# Patient Record
Sex: Female | Born: 1965
Health system: Southern US, Community
[De-identification: ages and names within clinical notes are randomized; demographics above are authoritative.]

## PROBLEM LIST (undated history)

## (undated) DIAGNOSIS — B019 Varicella without complication: Secondary | ICD-10-CM

## (undated) DIAGNOSIS — R140 Abdominal distension (gaseous): Secondary | ICD-10-CM

## (undated) DIAGNOSIS — C4490 Unspecified malignant neoplasm of skin, unspecified: Secondary | ICD-10-CM

## (undated) DIAGNOSIS — F419 Anxiety disorder, unspecified: Secondary | ICD-10-CM

## (undated) DIAGNOSIS — B379 Candidiasis, unspecified: Secondary | ICD-10-CM

## (undated) DIAGNOSIS — D649 Anemia, unspecified: Secondary | ICD-10-CM

## (undated) DIAGNOSIS — M5030 Other cervical disc degeneration, unspecified cervical region: Secondary | ICD-10-CM

## (undated) DIAGNOSIS — C801 Malignant (primary) neoplasm, unspecified: Secondary | ICD-10-CM

## (undated) DIAGNOSIS — M549 Dorsalgia, unspecified: Secondary | ICD-10-CM

## (undated) DIAGNOSIS — E785 Hyperlipidemia, unspecified: Secondary | ICD-10-CM

## (undated) DIAGNOSIS — A389 Scarlet fever, uncomplicated: Secondary | ICD-10-CM

## (undated) DIAGNOSIS — H409 Unspecified glaucoma: Secondary | ICD-10-CM

## (undated) DIAGNOSIS — M1990 Unspecified osteoarthritis, unspecified site: Secondary | ICD-10-CM

## (undated) DIAGNOSIS — K219 Gastro-esophageal reflux disease without esophagitis: Secondary | ICD-10-CM

## (undated) DIAGNOSIS — R143 Flatulence: Secondary | ICD-10-CM

## (undated) DIAGNOSIS — K279 Peptic ulcer, site unspecified, unspecified as acute or chronic, without hemorrhage or perforation: Secondary | ICD-10-CM

## (undated) DIAGNOSIS — K567 Ileus, unspecified: Secondary | ICD-10-CM

## (undated) DIAGNOSIS — N390 Urinary tract infection, site not specified: Secondary | ICD-10-CM

## (undated) DIAGNOSIS — E7800 Pure hypercholesterolemia, unspecified: Secondary | ICD-10-CM

## (undated) DIAGNOSIS — F32A Depression, unspecified: Secondary | ICD-10-CM

## (undated) HISTORY — PX: BREAST BIOPSY: SHX20

## (undated) HISTORY — DX: Pure hypercholesterolemia, unspecified: E78.00

## (undated) HISTORY — DX: Anemia, unspecified: D64.9

## (undated) HISTORY — DX: Gastro-esophageal reflux disease without esophagitis: K21.9

## (undated) HISTORY — DX: Other cervical disc degeneration, unspecified cervical region: M50.30

## (undated) HISTORY — DX: Unspecified osteoarthritis, unspecified site: M19.90

## (undated) HISTORY — DX: Peptic ulcer, site unspecified, unspecified as acute or chronic, without hemorrhage or perforation: K27.9

## (undated) HISTORY — DX: Unspecified glaucoma: H40.9

## (undated) HISTORY — PX: BASAL CELL CARCINOMA EXCISION: SHX1214

## (undated) HISTORY — DX: Hyperlipidemia, unspecified: E78.5

## (undated) HISTORY — DX: Malignant (primary) neoplasm, unspecified: C80.1

## (undated) HISTORY — DX: Dorsalgia, unspecified: M54.9

## (undated) HISTORY — DX: Unspecified malignant neoplasm of skin, unspecified: C44.90

## (undated) HISTORY — PX: COLONOSCOPY: SHX174

## (undated) HISTORY — DX: Urinary tract infection, site not specified: N39.0

## (undated) HISTORY — DX: Scarlet fever, uncomplicated: A38.9

## (undated) HISTORY — PX: UPPER GASTROINTESTINAL ENDOSCOPY: SHX188

## (undated) HISTORY — DX: Flatulence: R14.3

## (undated) HISTORY — DX: Depression, unspecified: F32.A

## (undated) HISTORY — DX: Candidiasis, unspecified: B37.9

## (undated) HISTORY — DX: Varicella without complication: B01.9

## (undated) HISTORY — DX: Abdominal distension (gaseous): R14.0

## (undated) HISTORY — DX: Anxiety disorder, unspecified: F41.9

---

## 1990-10-21 HISTORY — PX: BREAST EXCISIONAL BIOPSY: SUR124

## 1990-10-21 HISTORY — PX: BREAST BIOPSY: SHX20

## 1990-10-21 HISTORY — PX: BREAST LUMPECTOMY: SHX2

## 1997-12-02 ENCOUNTER — Inpatient Hospital Stay (HOSPITAL_COMMUNITY): Admission: AD | Admit: 1997-12-02 | Discharge: 1997-12-03 | Payer: Self-pay | Admitting: Family Medicine

## 1998-08-23 ENCOUNTER — Other Ambulatory Visit: Admission: RE | Admit: 1998-08-23 | Discharge: 1998-08-23 | Payer: Self-pay | Admitting: Obstetrics and Gynecology

## 2000-08-26 ENCOUNTER — Encounter: Admission: RE | Admit: 2000-08-26 | Discharge: 2000-11-24 | Payer: Self-pay | Admitting: Family Medicine

## 2001-01-12 ENCOUNTER — Encounter
Admission: RE | Admit: 2001-01-12 | Discharge: 2001-01-22 | Payer: Self-pay | Admitting: Physical Medicine and Rehabilitation

## 2001-08-18 ENCOUNTER — Other Ambulatory Visit: Admission: RE | Admit: 2001-08-18 | Discharge: 2001-08-18 | Payer: Self-pay | Admitting: *Deleted

## 2002-10-01 ENCOUNTER — Other Ambulatory Visit: Admission: RE | Admit: 2002-10-01 | Discharge: 2002-10-01 | Payer: Self-pay | Admitting: *Deleted

## 2006-07-02 ENCOUNTER — Encounter: Admission: RE | Admit: 2006-07-02 | Discharge: 2006-07-02 | Payer: Self-pay | Admitting: Obstetrics and Gynecology

## 2007-07-06 ENCOUNTER — Ambulatory Visit (HOSPITAL_COMMUNITY): Admission: RE | Admit: 2007-07-06 | Discharge: 2007-07-06 | Payer: Self-pay | Admitting: Obstetrics and Gynecology

## 2008-07-06 ENCOUNTER — Ambulatory Visit (HOSPITAL_COMMUNITY): Admission: RE | Admit: 2008-07-06 | Discharge: 2008-07-06 | Payer: Self-pay | Admitting: Obstetrics and Gynecology

## 2008-08-05 ENCOUNTER — Ambulatory Visit (HOSPITAL_COMMUNITY): Admission: RE | Admit: 2008-08-05 | Discharge: 2008-08-05 | Payer: Self-pay | Admitting: Orthopaedic Surgery

## 2009-07-19 ENCOUNTER — Ambulatory Visit (HOSPITAL_COMMUNITY): Admission: RE | Admit: 2009-07-19 | Discharge: 2009-07-19 | Payer: Self-pay | Admitting: Obstetrics and Gynecology

## 2009-08-28 ENCOUNTER — Emergency Department (HOSPITAL_COMMUNITY): Admission: EM | Admit: 2009-08-28 | Discharge: 2009-08-28 | Payer: Self-pay | Admitting: Emergency Medicine

## 2010-07-20 ENCOUNTER — Ambulatory Visit (HOSPITAL_COMMUNITY): Admission: RE | Admit: 2010-07-20 | Discharge: 2010-07-20 | Payer: Self-pay | Admitting: Obstetrics and Gynecology

## 2010-11-11 ENCOUNTER — Encounter: Payer: Self-pay | Admitting: Obstetrics and Gynecology

## 2010-11-12 ENCOUNTER — Encounter: Payer: Self-pay | Admitting: Orthopaedic Surgery

## 2011-03-18 ENCOUNTER — Inpatient Hospital Stay (INDEPENDENT_AMBULATORY_CARE_PROVIDER_SITE_OTHER)
Admission: RE | Admit: 2011-03-18 | Discharge: 2011-03-18 | Disposition: A | Discharge status: Home / Self Care | Payer: 59 | Source: Ambulatory Visit | Attending: Family Medicine | Admitting: Family Medicine

## 2011-03-18 DIAGNOSIS — L989 Disorder of the skin and subcutaneous tissue, unspecified: Secondary | ICD-10-CM

## 2011-06-05 ENCOUNTER — Other Ambulatory Visit (HOSPITAL_COMMUNITY): Payer: Self-pay | Admitting: Family Medicine

## 2011-06-05 ENCOUNTER — Ambulatory Visit (HOSPITAL_COMMUNITY)
Admission: RE | Admit: 2011-06-05 | Discharge: 2011-06-05 | Disposition: A | Discharge status: Home / Self Care | Payer: 59 | Source: Ambulatory Visit | Attending: Family Medicine | Admitting: Family Medicine

## 2011-06-05 DIAGNOSIS — H538 Other visual disturbances: Secondary | ICD-10-CM | POA: Insufficient documentation

## 2011-06-05 DIAGNOSIS — R51 Headache: Secondary | ICD-10-CM | POA: Insufficient documentation

## 2011-07-02 ENCOUNTER — Other Ambulatory Visit (HOSPITAL_COMMUNITY): Payer: Self-pay | Admitting: Obstetrics and Gynecology

## 2011-07-02 DIAGNOSIS — Z1231 Encounter for screening mammogram for malignant neoplasm of breast: Secondary | ICD-10-CM

## 2011-07-25 ENCOUNTER — Ambulatory Visit (HOSPITAL_COMMUNITY)
Admission: RE | Admit: 2011-07-25 | Discharge: 2011-07-25 | Disposition: A | Discharge status: Home / Self Care | Payer: 59 | Source: Ambulatory Visit | Attending: Obstetrics and Gynecology | Admitting: Obstetrics and Gynecology

## 2011-07-25 DIAGNOSIS — Z1231 Encounter for screening mammogram for malignant neoplasm of breast: Secondary | ICD-10-CM | POA: Insufficient documentation

## 2012-06-30 ENCOUNTER — Other Ambulatory Visit (HOSPITAL_COMMUNITY): Payer: Self-pay | Admitting: Obstetrics and Gynecology

## 2012-06-30 DIAGNOSIS — Z1231 Encounter for screening mammogram for malignant neoplasm of breast: Secondary | ICD-10-CM

## 2012-07-13 ENCOUNTER — Ambulatory Visit (HOSPITAL_COMMUNITY): Payer: 59

## 2012-07-30 ENCOUNTER — Other Ambulatory Visit (HOSPITAL_COMMUNITY): Payer: Self-pay | Admitting: Family Medicine

## 2012-07-30 DIAGNOSIS — M549 Dorsalgia, unspecified: Secondary | ICD-10-CM

## 2012-08-03 ENCOUNTER — Ambulatory Visit (HOSPITAL_COMMUNITY)
Admission: RE | Admit: 2012-08-03 | Discharge: 2012-08-03 | Disposition: A | Discharge status: Home / Self Care | Payer: 59 | Source: Ambulatory Visit | Attending: Obstetrics and Gynecology | Admitting: Obstetrics and Gynecology

## 2012-08-03 DIAGNOSIS — Z1231 Encounter for screening mammogram for malignant neoplasm of breast: Secondary | ICD-10-CM | POA: Insufficient documentation

## 2012-08-05 ENCOUNTER — Ambulatory Visit (HOSPITAL_COMMUNITY)
Admission: RE | Admit: 2012-08-05 | Discharge: 2012-08-05 | Disposition: A | Discharge status: Home / Self Care | Payer: 59 | Source: Ambulatory Visit | Attending: Family Medicine | Admitting: Family Medicine

## 2012-08-05 DIAGNOSIS — M545 Low back pain, unspecified: Secondary | ICD-10-CM | POA: Insufficient documentation

## 2012-08-05 DIAGNOSIS — M549 Dorsalgia, unspecified: Secondary | ICD-10-CM

## 2012-08-05 DIAGNOSIS — M48061 Spinal stenosis, lumbar region without neurogenic claudication: Secondary | ICD-10-CM | POA: Insufficient documentation

## 2012-08-25 ENCOUNTER — Ambulatory Visit: Payer: 59 | Attending: Family Medicine | Admitting: Physical Therapy

## 2012-08-25 DIAGNOSIS — IMO0001 Reserved for inherently not codable concepts without codable children: Secondary | ICD-10-CM | POA: Insufficient documentation

## 2012-08-25 DIAGNOSIS — M545 Low back pain, unspecified: Secondary | ICD-10-CM | POA: Insufficient documentation

## 2012-08-25 DIAGNOSIS — M2569 Stiffness of other specified joint, not elsewhere classified: Secondary | ICD-10-CM | POA: Insufficient documentation

## 2012-08-27 ENCOUNTER — Ambulatory Visit: Payer: 59 | Admitting: Physical Therapy

## 2012-09-01 ENCOUNTER — Ambulatory Visit: Payer: 59 | Admitting: Physical Therapy

## 2012-09-03 ENCOUNTER — Ambulatory Visit: Payer: 59 | Admitting: Physical Therapy

## 2012-09-09 ENCOUNTER — Ambulatory Visit: Payer: 59 | Admitting: Physical Therapy

## 2012-09-14 ENCOUNTER — Ambulatory Visit: Payer: 59 | Admitting: Physical Therapy

## 2012-11-30 ENCOUNTER — Other Ambulatory Visit (HOSPITAL_COMMUNITY): Payer: Self-pay | Admitting: Obstetrics and Gynecology

## 2012-11-30 DIAGNOSIS — N930 Postcoital and contact bleeding: Secondary | ICD-10-CM

## 2012-12-02 ENCOUNTER — Ambulatory Visit (HOSPITAL_COMMUNITY)
Admission: RE | Admit: 2012-12-02 | Discharge: 2012-12-02 | Disposition: A | Discharge status: Home / Self Care | Payer: 59 | Source: Ambulatory Visit | Attending: Obstetrics and Gynecology | Admitting: Obstetrics and Gynecology

## 2012-12-02 DIAGNOSIS — N854 Malposition of uterus: Secondary | ICD-10-CM | POA: Insufficient documentation

## 2012-12-02 DIAGNOSIS — N930 Postcoital and contact bleeding: Secondary | ICD-10-CM | POA: Insufficient documentation

## 2013-07-08 ENCOUNTER — Other Ambulatory Visit (HOSPITAL_COMMUNITY): Payer: Self-pay | Admitting: Obstetrics and Gynecology

## 2013-07-08 DIAGNOSIS — Z1231 Encounter for screening mammogram for malignant neoplasm of breast: Secondary | ICD-10-CM

## 2013-08-04 ENCOUNTER — Ambulatory Visit (HOSPITAL_COMMUNITY)
Admission: RE | Admit: 2013-08-04 | Discharge: 2013-08-04 | Disposition: A | Discharge status: Home / Self Care | Payer: 59 | Source: Ambulatory Visit | Attending: Obstetrics and Gynecology | Admitting: Obstetrics and Gynecology

## 2013-08-04 DIAGNOSIS — Z1231 Encounter for screening mammogram for malignant neoplasm of breast: Secondary | ICD-10-CM | POA: Insufficient documentation

## 2014-07-08 ENCOUNTER — Other Ambulatory Visit (HOSPITAL_COMMUNITY): Payer: Self-pay | Admitting: Obstetrics and Gynecology

## 2014-07-08 DIAGNOSIS — Z1231 Encounter for screening mammogram for malignant neoplasm of breast: Secondary | ICD-10-CM

## 2014-08-10 ENCOUNTER — Ambulatory Visit (HOSPITAL_COMMUNITY)
Admission: RE | Admit: 2014-08-10 | Discharge: 2014-08-10 | Disposition: A | Discharge status: Home / Self Care | Payer: 59 | Source: Ambulatory Visit | Attending: Obstetrics and Gynecology | Admitting: Obstetrics and Gynecology

## 2014-08-10 DIAGNOSIS — Z1231 Encounter for screening mammogram for malignant neoplasm of breast: Secondary | ICD-10-CM | POA: Diagnosis not present

## 2014-11-08 ENCOUNTER — Other Ambulatory Visit: Payer: Self-pay | Admitting: Gastroenterology

## 2014-11-08 DIAGNOSIS — R1310 Dysphagia, unspecified: Secondary | ICD-10-CM

## 2014-11-09 ENCOUNTER — Ambulatory Visit
Admission: RE | Admit: 2014-11-09 | Discharge: 2014-11-09 | Disposition: A | Discharge status: Home / Self Care | Payer: 59 | Source: Ambulatory Visit | Attending: Gastroenterology | Admitting: Gastroenterology

## 2014-11-09 DIAGNOSIS — R1310 Dysphagia, unspecified: Secondary | ICD-10-CM

## 2015-07-19 ENCOUNTER — Other Ambulatory Visit: Payer: Self-pay

## 2015-07-19 DIAGNOSIS — Z1231 Encounter for screening mammogram for malignant neoplasm of breast: Secondary | ICD-10-CM

## 2015-08-22 ENCOUNTER — Ambulatory Visit
Admission: RE | Admit: 2015-08-22 | Discharge: 2015-08-22 | Disposition: A | Discharge status: Home / Self Care | Payer: 59 | Source: Ambulatory Visit

## 2015-08-22 DIAGNOSIS — Z1231 Encounter for screening mammogram for malignant neoplasm of breast: Secondary | ICD-10-CM

## 2015-11-01 MED FILL — FLUoxetine HCL 20 MG CAPS: 20 | 90 days supply | Qty: 90 | Fill #0

## 2015-11-10 ENCOUNTER — Other Ambulatory Visit (HOSPITAL_COMMUNITY): Payer: Self-pay | Admitting: Family Medicine

## 2015-11-10 DIAGNOSIS — R0789 Other chest pain: Secondary | ICD-10-CM

## 2015-11-10 DIAGNOSIS — K219 Gastro-esophageal reflux disease without esophagitis: Secondary | ICD-10-CM | POA: Diagnosis not present

## 2015-11-10 MED FILL — SUCRALFATE 1 GM TABLET: 1 | 60 days supply | Qty: 60 | Fill #0

## 2015-11-13 ENCOUNTER — Ambulatory Visit (HOSPITAL_COMMUNITY)
Admission: RE | Admit: 2015-11-13 | Discharge: 2015-11-13 | Disposition: A | Discharge status: Home / Self Care | Payer: 59 | Source: Ambulatory Visit | Attending: Family Medicine | Admitting: Family Medicine

## 2015-11-13 ENCOUNTER — Encounter (HOSPITAL_COMMUNITY): Payer: Self-pay

## 2015-11-13 DIAGNOSIS — R0789 Other chest pain: Secondary | ICD-10-CM | POA: Diagnosis not present

## 2015-11-13 DIAGNOSIS — R079 Chest pain, unspecified: Secondary | ICD-10-CM | POA: Diagnosis not present

## 2015-11-13 MED ORDER — IOHEXOL 300 MG/ML  SOLN
80.0000 mL | Freq: Once | INTRAMUSCULAR | Status: AC | PRN
  Administered 2015-11-13: 75 mL via INTRAVENOUS

## 2015-12-05 ENCOUNTER — Ambulatory Visit (INDEPENDENT_AMBULATORY_CARE_PROVIDER_SITE_OTHER): Payer: 59 | Admitting: Internal Medicine

## 2015-12-05 ENCOUNTER — Encounter: Payer: Self-pay | Admitting: Internal Medicine

## 2015-12-05 VITALS — BP 112/64 | HR 68 | Ht 62.5 in | Wt 142.0 lb

## 2015-12-05 DIAGNOSIS — R0789 Other chest pain: Secondary | ICD-10-CM | POA: Diagnosis not present

## 2015-12-05 LAB — NITRIC OXIDE: NITRIC OXIDE: 8

## 2015-12-05 NOTE — Progress Notes (Signed)
   Subjective:    Patient ID: Michele [name], female    DOB: 1965-11-17, 50 y.o.   MRN: [id number]  HPI    Review of Systems  Constitutional: Negative for fever and unexpected weight change.  HENT: Negative for congestion, dental problem, ear pain, nosebleeds, postnasal drip, rhinorrhea, sinus pressure, sneezing, sore throat and trouble swallowing.   Eyes: Negative for redness and itching.  Respiratory: Positive for cough and chest tightness. Negative for shortness of breath and wheezing.   Cardiovascular: Negative for palpitations and leg swelling.  Gastrointestinal: Negative for nausea and vomiting.  Genitourinary: Negative for dysuria.  Musculoskeletal: Negative for joint swelling.  Skin: Positive for rash.  Neurological: Positive for headaches.  Hematological: Does not bruise/bleed easily.  Psychiatric/Behavioral: Negative for dysphoric mood. The patient is not nervous/anxious.        Objective:   Physical Exam        Assessment & Plan:

## 2015-12-05 NOTE — Patient Instructions (Signed)
ICD-9-CM ICD-10-CM   1. Chest tightness or pressure 786.59 R07.89    Unclear what is causing his symptoms I am not sure if you're lung atelectasis is related to this or not There is no evidence of eosinophilic airway inflammation  Plan - I have asked for a second opinion on CT chest from thoracic radiologist; we will keep you posted - Full pulmonary function tests at the earliest available opportunity any location  - Call us on [phone] after the test or email me at my [facility] email - I will look at the test and then advised to the next step which might involve methacholine challenge test or a high-resolution CT chest  Follow-up   - Please stay in touch via phone or email

## 2015-12-05 NOTE — Addendum Note (Signed)
Addended by: Collier Salina on: 12/05/2015 05:09 PM   Modules accepted: Orders

## 2015-12-05 NOTE — Progress Notes (Signed)
Subjective:    Patient ID: Michele [name], female    DOB: [birth year], 50 y.o.   MRN: [id number]  PCP Shirline Frees, MD   HPI IOV 12/05/2015  Chief Complaint  Patient presents with  . pulmonary consult    self ref. pt. c/o chest pain/tightness X33mo. prod. cough yellow in colorX1wk. denies wheezing.   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  Reports an unusual feeling of chest tightness in the front and back since September 2016. Back in September 2016 it was of insidious onset and she only had one episode but by end of October 2016 it was happening more frequently. At this point in time she saw primary care physician Dr. Kenton Kingfisher who between October 2016 in January 2017 tried different acid reflux regimens and even got an EKG but these did not help. In fact she got headaches with acid reflux medication. In the last one month the symptoms are present on a daily basis he did it is not constant. It is more episodic. This no clear cut aggravating or relieving factors. She describes the symptoms as summary took the suture between the second and third intercostal space and bilaterally tight in her chest. She feels the same in the opposite side of her chest. For the last 10 days or so she has a mild cough with mild yellow sputum occasidonally. Symptoms can be made worse by eating or drinking or sitting in the chair awaking up but not by exertion. She goes for daily walks. Symptoms are associated with a sense that she has to take a deep breath to get all the air in. This no definite wheezing.  She had a plain CT chest 11/13/2015 that is reported as bilateral by basal subpleural atelectasis with right greater than the left. Overall the CT read as being considered normal. I personally visualized this film and I can see some subtle areas of right lower lobe scarring presumably from previous walking pneumonia versus atelectasis.  Exhaled nitric oxide today in our  office - 8ppb and normal  Walk test 185 feet x 3 laps on RA - > did not desat  Seeing DR Oletta Lamas GI 12/06/15   Past  - c6 "bulging" disc - Rx prozac - myofascial spams and neuropathy 2009   has a past medical history of Hyperlipidemia.   reports that she has never smoked. She does not have any smokeless tobacco history on file.  History reviewed. No pertinent past surgical history.  Allergies  Allergen Reactions  . Celebrex [Celecoxib] Rash    Immunization History  Administered Date(s) Administered  . Influenza Split 08/07/2015    Family History  Problem Relation Age of Onset  . Hypertension Mother   . Diabetes Mother   . Diabetes Father   . Stroke Maternal Uncle      Current outpatient prescriptions:  .  b complex vitamins tablet, Take 1 tablet by mouth daily., Disp: , Rfl:  .  Specialty Vitamins Products (MAGNESIUM, AMINO ACID CHELATE,) 133 MG tablet, Take 1 tablet by mouth daily., Disp: , Rfl:  .  FLUoxetine (PROZAC) 20 MG capsule, , Disp: , Rfl: 1       Review of Systems     Objective:   Physical Exam  Constitutional: She is oriented to person, place, and time. She appears well-developed and well-nourished. No distress.  HENT:  Head: Normocephalic and atraumatic.  Right Ear: External ear normal.  Left Ear: External ear normal.  Mouth/Throat:  Oropharynx is clear and moist. No oropharyngeal exudate.  Eyes: Conjunctivae and EOM are normal. Pupils are equal, round, and reactive to light. Right eye exhibits no discharge. Left eye exhibits no discharge. No scleral icterus.  Neck: Normal range of motion. Neck supple. No JVD present. No tracheal deviation present. No thyromegaly present.  Cardiovascular: Normal rate, regular rhythm, normal heart sounds and intact distal pulses.  Exam reveals no gallop and no friction rub.   No murmur heard. Pulmonary/Chest: Effort normal and breath sounds normal. No respiratory distress. She has no wheezes. She has no rales.  She exhibits no tenderness.  Abdominal: Soft. Bowel sounds are normal. She exhibits no distension and no mass. There is no tenderness. There is no rebound and no guarding.  Musculoskeletal: Normal range of motion. She exhibits no edema or tenderness.  Lymphadenopathy:    She has no cervical adenopathy.  Neurological: She is alert and oriented to person, place, and time. She has normal reflexes. No cranial nerve deficit. She exhibits normal muscle tone. Coordination normal.  Skin: Skin is warm and dry. No rash noted. She is not diaphoretic. No erythema. No pallor.  Psychiatric: She has a normal mood and affect. Her behavior is normal. Judgment and thought content normal.  Vitals reviewed.   Filed Vitals:   12/05/15 1459  BP: 112/64  Pulse: 68  Height: 5' 2.5" (1.588 m)  Weight: 142 lb (64.411 kg)  SpO2: 100%          Assessment & Plan:     ICD-9-CM ICD-10-CM   1. Chest tightness or pressure 786.59 R07.89     Unclear what is causing his symptoms I am not sure if you're lung atelectasis is related to this or not There is no evidence of eosinophilic airway inflammation  Plan - I have asked for a second opinion on CT chest from thoracic radiologist; we will keep you posted - Full pulmonary function tests at the earliest available opportunity any location  - Call us on [phone] after the test or email me at my [facility] email - I will look at the test and then advised to the next step which might involve methacholine challenge test or a high-resolution CT chest  Follow-up   - Please stay in touch via phone or email - will be interested in seeing what DR Oletta Lamas GI says   Dr. Brand Males, M.D., [facility].C.P Pulmonary and Critical Care Medicine Staff Physician [facility] Pager: [phone], If no answer or between  15:00h - 7:00h: call [phone]  12/05/2015 3:56 PM

## 2015-12-06 DIAGNOSIS — R0789 Other chest pain: Secondary | ICD-10-CM | POA: Diagnosis not present

## 2015-12-11 ENCOUNTER — Encounter (HOSPITAL_COMMUNITY): Payer: 59

## 2015-12-13 ENCOUNTER — Other Ambulatory Visit: Payer: Self-pay | Admitting: Gastroenterology

## 2015-12-20 ENCOUNTER — Encounter (HOSPITAL_COMMUNITY): Admission: RE | Payer: Self-pay | Source: Ambulatory Visit

## 2015-12-20 ENCOUNTER — Ambulatory Visit (HOSPITAL_COMMUNITY): Admission: RE | Admit: 2015-12-20 | Payer: 59 | Source: Ambulatory Visit | Admitting: Gastroenterology

## 2015-12-20 SURGERY — EGD (ESOPHAGOGASTRODUODENOSCOPY)
Anesthesia: Monitor Anesthesia Care

## 2016-01-23 MED FILL — FLUoxetine HCL 20 MG CAPS: 20 | 90 days supply | Qty: 90 | Fill #1

## 2016-01-27 DIAGNOSIS — R1084 Generalized abdominal pain: Secondary | ICD-10-CM | POA: Diagnosis not present

## 2016-01-27 DIAGNOSIS — R1013 Epigastric pain: Secondary | ICD-10-CM | POA: Diagnosis not present

## 2016-01-27 DIAGNOSIS — R110 Nausea: Secondary | ICD-10-CM | POA: Diagnosis not present

## 2016-01-31 DIAGNOSIS — R143 Flatulence: Secondary | ICD-10-CM | POA: Diagnosis not present

## 2016-01-31 DIAGNOSIS — M25552 Pain in left hip: Secondary | ICD-10-CM | POA: Diagnosis not present

## 2016-01-31 DIAGNOSIS — R195 Other fecal abnormalities: Secondary | ICD-10-CM | POA: Diagnosis not present

## 2016-02-09 MED FILL — PROMETHAZINE 12.5 MG TABLET: 12.5 | 10 days supply | Qty: 30 | Fill #0

## 2016-02-22 DIAGNOSIS — L237 Allergic contact dermatitis due to plants, except food: Secondary | ICD-10-CM | POA: Diagnosis not present

## 2016-02-22 MED FILL — CLOBETASOL 0.05% CREAM: 0.05 | 14 days supply | Qty: 60 | Fill #0

## 2016-03-13 DIAGNOSIS — H16212 Exposure keratoconjunctivitis, left eye: Secondary | ICD-10-CM | POA: Diagnosis not present

## 2016-03-13 MED FILL — ZYLET EYE DROPS: 0.5-0.3 | 25 days supply | Qty: 5 | Fill #0

## 2016-03-19 MED FILL — metroNIDAZOLE 500 MG TABS: 500 | 5 days supply | Qty: 10 | Fill #0

## 2016-03-28 DIAGNOSIS — Z6824 Body mass index (BMI) 24.0-24.9, adult: Secondary | ICD-10-CM | POA: Diagnosis not present

## 2016-03-28 DIAGNOSIS — Z01419 Encounter for gynecological examination (general) (routine) without abnormal findings: Secondary | ICD-10-CM | POA: Diagnosis not present

## 2016-05-01 MED FILL — FLUoxetine HCL 20 MG CAPS: 20 | 90 days supply | Qty: 90 | Fill #0

## 2016-07-02 MED FILL — FLUCONAZOLE 150 MG TABLET: 150 | 3 days supply | Qty: 2 | Fill #0

## 2016-08-01 MED FILL — FLUCONAZOLE 150 MG TABLET: 150 | 4 days supply | Qty: 2 | Fill #0

## 2016-08-01 MED FILL — FLUCONAZOLE 200 MG TABLET: 200 | 1 days supply | Qty: 1 | Fill #0

## 2016-08-08 ENCOUNTER — Other Ambulatory Visit: Payer: Self-pay | Admitting: Obstetrics and Gynecology

## 2016-08-08 DIAGNOSIS — Z1231 Encounter for screening mammogram for malignant neoplasm of breast: Secondary | ICD-10-CM

## 2016-08-08 MED FILL — FLUoxetine HCL 20 MG CAPS: 20 | 90 days supply | Qty: 90 | Fill #0

## 2016-08-13 DIAGNOSIS — D225 Melanocytic nevi of trunk: Secondary | ICD-10-CM | POA: Diagnosis not present

## 2016-08-13 DIAGNOSIS — Z85828 Personal history of other malignant neoplasm of skin: Secondary | ICD-10-CM | POA: Diagnosis not present

## 2016-08-13 DIAGNOSIS — L700 Acne vulgaris: Secondary | ICD-10-CM | POA: Diagnosis not present

## 2016-08-13 DIAGNOSIS — L814 Other melanin hyperpigmentation: Secondary | ICD-10-CM | POA: Diagnosis not present

## 2016-08-13 DIAGNOSIS — L821 Other seborrheic keratosis: Secondary | ICD-10-CM | POA: Diagnosis not present

## 2016-08-13 DIAGNOSIS — D2261 Melanocytic nevi of right upper limb, including shoulder: Secondary | ICD-10-CM | POA: Diagnosis not present

## 2016-08-13 DIAGNOSIS — L570 Actinic keratosis: Secondary | ICD-10-CM | POA: Diagnosis not present

## 2016-08-13 DIAGNOSIS — L720 Epidermal cyst: Secondary | ICD-10-CM | POA: Diagnosis not present

## 2016-08-13 DIAGNOSIS — D1801 Hemangioma of skin and subcutaneous tissue: Secondary | ICD-10-CM | POA: Diagnosis not present

## 2016-08-15 DIAGNOSIS — N760 Acute vaginitis: Secondary | ICD-10-CM | POA: Diagnosis not present

## 2016-08-28 ENCOUNTER — Ambulatory Visit
Admission: RE | Admit: 2016-08-28 | Discharge: 2016-08-28 | Disposition: A | Discharge status: Home / Self Care | Payer: 59 | Source: Ambulatory Visit | Attending: Obstetrics and Gynecology | Admitting: Obstetrics and Gynecology

## 2016-08-28 DIAGNOSIS — Z1231 Encounter for screening mammogram for malignant neoplasm of breast: Secondary | ICD-10-CM

## 2016-09-04 DIAGNOSIS — F419 Anxiety disorder, unspecified: Secondary | ICD-10-CM | POA: Diagnosis not present

## 2016-09-04 DIAGNOSIS — E7800 Pure hypercholesterolemia, unspecified: Secondary | ICD-10-CM | POA: Diagnosis not present

## 2016-09-04 DIAGNOSIS — Z833 Family history of diabetes mellitus: Secondary | ICD-10-CM | POA: Diagnosis not present

## 2016-11-18 MED FILL — FLUoxetine HCL 20 MG CAPS: 20 | 90 days supply | Qty: 90 | Fill #0

## 2016-12-06 DIAGNOSIS — R079 Chest pain, unspecified: Secondary | ICD-10-CM | POA: Diagnosis not present

## 2016-12-06 DIAGNOSIS — Z13220 Encounter for screening for lipoid disorders: Secondary | ICD-10-CM | POA: Diagnosis not present

## 2016-12-06 DIAGNOSIS — M26609 Unspecified temporomandibular joint disorder, unspecified side: Secondary | ICD-10-CM | POA: Diagnosis not present

## 2016-12-06 DIAGNOSIS — E7800 Pure hypercholesterolemia, unspecified: Secondary | ICD-10-CM | POA: Diagnosis not present

## 2016-12-06 DIAGNOSIS — M542 Cervicalgia: Secondary | ICD-10-CM | POA: Diagnosis not present

## 2016-12-13 ENCOUNTER — Other Ambulatory Visit (HOSPITAL_COMMUNITY): Payer: Self-pay | Admitting: Family Medicine

## 2016-12-13 DIAGNOSIS — R519 Headache, unspecified: Secondary | ICD-10-CM

## 2016-12-13 DIAGNOSIS — R51 Headache: Principal | ICD-10-CM

## 2016-12-13 MED FILL — ROSUVASTATIN CALCIUM 5 MG T: 5 | 30 days supply | Qty: 30 | Fill #0

## 2016-12-26 ENCOUNTER — Ambulatory Visit (HOSPITAL_COMMUNITY)
Admission: RE | Admit: 2016-12-26 | Discharge: 2016-12-26 | Disposition: A | Discharge status: Home / Self Care | Payer: 59 | Source: Ambulatory Visit | Attending: Family Medicine | Admitting: Family Medicine

## 2016-12-26 ENCOUNTER — Encounter (HOSPITAL_COMMUNITY): Payer: Self-pay

## 2016-12-26 DIAGNOSIS — R51 Headache: Secondary | ICD-10-CM | POA: Diagnosis not present

## 2016-12-26 DIAGNOSIS — R519 Headache, unspecified: Secondary | ICD-10-CM

## 2017-01-01 MED FILL — BUPROPION HCL XL 150 MG TAB: 150 | 30 days supply | Qty: 30 | Fill #0

## 2017-02-24 MED FILL — FLUoxetine HCL 20 MG CAPS: 20 | 90 days supply | Qty: 90 | Fill #1

## 2017-04-14 DIAGNOSIS — Z01419 Encounter for gynecological examination (general) (routine) without abnormal findings: Secondary | ICD-10-CM | POA: Diagnosis not present

## 2017-04-14 DIAGNOSIS — Z6827 Body mass index (BMI) 27.0-27.9, adult: Secondary | ICD-10-CM | POA: Diagnosis not present

## 2017-05-27 DIAGNOSIS — N958 Other specified menopausal and perimenopausal disorders: Secondary | ICD-10-CM | POA: Diagnosis not present

## 2017-05-27 DIAGNOSIS — N930 Postcoital and contact bleeding: Secondary | ICD-10-CM | POA: Diagnosis not present

## 2017-05-28 DIAGNOSIS — R140 Abdominal distension (gaseous): Secondary | ICD-10-CM | POA: Diagnosis not present

## 2017-05-28 DIAGNOSIS — R143 Flatulence: Secondary | ICD-10-CM | POA: Diagnosis not present

## 2017-06-05 MED FILL — FLUoxetine HCL 20 MG CAPS: 20 | 90 days supply | Qty: 90 | Fill #2

## 2017-06-13 DIAGNOSIS — F419 Anxiety disorder, unspecified: Secondary | ICD-10-CM | POA: Diagnosis not present

## 2017-07-24 ENCOUNTER — Other Ambulatory Visit: Payer: Self-pay | Admitting: Obstetrics and Gynecology

## 2017-07-24 DIAGNOSIS — Z1239 Encounter for other screening for malignant neoplasm of breast: Secondary | ICD-10-CM

## 2017-08-13 DIAGNOSIS — L237 Allergic contact dermatitis due to plants, except food: Secondary | ICD-10-CM | POA: Diagnosis not present

## 2017-08-13 DIAGNOSIS — D1801 Hemangioma of skin and subcutaneous tissue: Secondary | ICD-10-CM | POA: Diagnosis not present

## 2017-08-13 DIAGNOSIS — D225 Melanocytic nevi of trunk: Secondary | ICD-10-CM | POA: Diagnosis not present

## 2017-08-13 DIAGNOSIS — L821 Other seborrheic keratosis: Secondary | ICD-10-CM | POA: Diagnosis not present

## 2017-08-13 DIAGNOSIS — L820 Inflamed seborrheic keratosis: Secondary | ICD-10-CM | POA: Diagnosis not present

## 2017-08-13 DIAGNOSIS — Z85828 Personal history of other malignant neoplasm of skin: Secondary | ICD-10-CM | POA: Diagnosis not present

## 2017-08-13 DIAGNOSIS — Z23 Encounter for immunization: Secondary | ICD-10-CM | POA: Diagnosis not present

## 2017-08-13 DIAGNOSIS — D2261 Melanocytic nevi of right upper limb, including shoulder: Secondary | ICD-10-CM | POA: Diagnosis not present

## 2017-08-13 DIAGNOSIS — L814 Other melanin hyperpigmentation: Secondary | ICD-10-CM | POA: Diagnosis not present

## 2017-08-13 MED FILL — predniSONE 20 MG TABS: 20 | 14 days supply | Qty: 21 | Fill #0

## 2017-08-26 DIAGNOSIS — R6889 Other general symptoms and signs: Secondary | ICD-10-CM | POA: Diagnosis not present

## 2017-08-26 DIAGNOSIS — J029 Acute pharyngitis, unspecified: Secondary | ICD-10-CM | POA: Diagnosis not present

## 2017-09-03 ENCOUNTER — Ambulatory Visit
Admission: RE | Admit: 2017-09-03 | Discharge: 2017-09-03 | Disposition: A | Discharge status: Home / Self Care | Payer: 59 | Source: Ambulatory Visit | Attending: Obstetrics and Gynecology | Admitting: Obstetrics and Gynecology

## 2017-09-03 DIAGNOSIS — Z1231 Encounter for screening mammogram for malignant neoplasm of breast: Secondary | ICD-10-CM | POA: Diagnosis not present

## 2017-09-03 DIAGNOSIS — Z1239 Encounter for other screening for malignant neoplasm of breast: Secondary | ICD-10-CM

## 2017-09-17 MED FILL — FLUoxetine HCL 20 MG CAPS: 20 | 90 days supply | Qty: 90 | Fill #0

## 2017-12-15 MED FILL — FLUoxetine HCL 20 MG CAPS: 20 | 90 days supply | Qty: 90 | Fill #1

## 2018-01-07 ENCOUNTER — Telehealth: Payer: Self-pay

## 2018-01-07 NOTE — Telephone Encounter (Signed)
SDENT REFERRAL TO SCHEDULING FROM DR Damaris Hippo PHONE #[phone]

## 2018-01-08 ENCOUNTER — Telehealth: Payer: Self-pay

## 2018-01-08 NOTE — Telephone Encounter (Signed)
SENT REFERRAL TO SCHEDULING 

## 2018-01-09 ENCOUNTER — Telehealth: Payer: Self-pay

## 2018-01-09 NOTE — Telephone Encounter (Signed)
NOTES ON FILE RJ 

## 2018-01-14 ENCOUNTER — Encounter: Payer: Self-pay | Admitting: Physician Assistant

## 2018-01-14 ENCOUNTER — Telehealth: Payer: Self-pay | Admitting: Cardiology

## 2018-01-14 ENCOUNTER — Encounter: Payer: Self-pay | Admitting: *Deleted

## 2018-01-14 ENCOUNTER — Encounter (INDEPENDENT_AMBULATORY_CARE_PROVIDER_SITE_OTHER): Payer: Self-pay

## 2018-01-14 ENCOUNTER — Telehealth: Payer: Self-pay | Admitting: Physician Assistant

## 2018-01-14 ENCOUNTER — Ambulatory Visit: Payer: No Typology Code available for payment source | Admitting: Physician Assistant

## 2018-01-14 VITALS — BP 90/60 | HR 68 | Ht 62.0 in | Wt 144.8 lb

## 2018-01-14 DIAGNOSIS — Z8249 Family history of ischemic heart disease and other diseases of the circulatory system: Secondary | ICD-10-CM | POA: Diagnosis not present

## 2018-01-14 DIAGNOSIS — Z01812 Encounter for preprocedural laboratory examination: Secondary | ICD-10-CM | POA: Diagnosis not present

## 2018-01-14 DIAGNOSIS — R140 Abdominal distension (gaseous): Secondary | ICD-10-CM | POA: Insufficient documentation

## 2018-01-14 DIAGNOSIS — E782 Mixed hyperlipidemia: Secondary | ICD-10-CM | POA: Diagnosis not present

## 2018-01-14 DIAGNOSIS — I200 Unstable angina: Secondary | ICD-10-CM | POA: Diagnosis not present

## 2018-01-14 LAB — TROPONIN T: Troponin T TROPT: <0.011 ng/mL (ref <0.011)

## 2018-01-14 LAB — D-DIMER, QUANTITATIVE: D-DIMER: <0.2 mg/L FEU (ref 0.00–0.49)

## 2018-01-14 MED ORDER — NITROGLYCERIN 0.4 MG SL SUBL: 0.4000 mg | SUBLINGUAL_TABLET | SUBLINGUAL | 3 refills | Status: DC | PRN

## 2018-01-14 MED FILL — NITROGLYCERIN 0.4 MG TAB SL: 0.4 | 25 days supply | Qty: 25 | Fill #0

## 2018-01-14 NOTE — Patient Instructions (Signed)
Your physician recommends that you continue on your current medications as directed. Please refer to the Current Medication list given to you today.   Your physician recommends that you return for lab work in:[facility] [place] OFFICE [address], [place] [address] Dept: [phone] Loc: [place]  01/14/2018  You are scheduled for a Cardiac Catheterization on Friday, March 29 with Dr. Harrell Gave End.  1. Please arrive at the [facility] (Main Entrance A) at [facility]: [place], [place] [address] at 11:30 AM (two hours before your procedure to ensure your preparation). Free valet parking service is available.   Special note: Every effort is made to have your procedure done on time. Please understand that emergencies sometimes delay scheduled procedures.  2. Diet: You may have a clear liquid breakfast, but nothing after 8 AM on your procedure day.  3. Labs: You will need to have blood drawn on Wednesday, March 27 at [facility] at [facility] [address]  Open: 7:30am - 5pm    Phone: [phone]. You do not need to be fasting.  4. Medication instructions in preparation for your procedure:      Current Outpatient Medications (Analgesics):  .  aspirin EC 81 MG tablet, Take 81 mg by mouth daily.   Current Outpatient Medications (Other):  .  b complex vitamins tablet, Take 1 tablet by mouth daily. [place] Kitchen  FLUoxetine (PROZAC) 20 MG tablet, Take 20 mg by mouth daily. [place] Kitchen  Specialty Vitamins Products (MAGNESIUM, AMINO ACID CHELATE,) 133 MG tablet, Take 1 tablet by mouth daily. *For reference purposes while preparing patient instructions.   Delete this med list prior to printing instructions for patient.* Nitroglycerin sublingual tablets What is this medicine? NITROGLYCERIN (nye troe GLI ser  in) is a type of vasodilator. It relaxes blood vessels, increasing the blood and oxygen supply to your heart. This medicine is used to relieve chest pain caused by angina. It is also used to prevent chest pain before activities like climbing stairs, going outdoors in cold weather, or sexual activity. This medicine may be used for other purposes; ask your health care provider or pharmacist if you have questions. COMMON BRAND NAME(S): Nitroquick, Nitrostat, Nitrotab What should I tell my health care provider before I take this medicine? They need to know if you have any of these conditions: -anemia -head injury, recent stroke, or bleeding in the brain -liver disease -previous heart attack -an unusual or allergic reaction to nitroglycerin, other medicines, foods, dyes, or preservatives -pregnant or trying to get pregnant -breast-feeding How should I use this medicine? Take this medicine by mouth as needed. At the first sign of an angina attack (chest pain or tightness) place one tablet under your tongue. You can also take this medicine 5 to 10 minutes before an event likely to produce chest pain. Follow the directions on the prescription label. Let the tablet dissolve under the tongue. Do not swallow whole. Replace the dose if you accidentally swallow it. It will help if your mouth is not dry. Saliva around the tablet will help it to dissolve more quickly. Do not eat or drink, smoke or chew tobacco while a tablet is dissolving. If you are not better within 5 minutes after taking ONE dose of nitroglycerin, call 9-1-1 immediately to seek emergency medical care. Do not  take more than 3 nitroglycerin tablets over 15 minutes. If you take this medicine often to relieve symptoms of angina, your doctor or health care professional may provide you with different instructions to manage your symptoms. If symptoms do not go away after following these instructions, it is important to call 9-1-1 immediately. Do not take  more than 3 nitroglycerin tablets over 15 minutes. Talk to your pediatrician regarding the use of this medicine in children. Special care may be needed. Overdosage: If you think you have taken too much of this medicine contact a poison control center or emergency room at once. NOTE: This medicine is only for you. Do not share this medicine with others. What if I miss a dose? This does not apply. This medicine is only used as needed. What may interact with this medicine? Do not take this medicine with any of the following medications: -certain migraine medicines like ergotamine and dihydroergotamine (DHE) -medicines used to treat erectile dysfunction like sildenafil, tadalafil, and vardenafil -riociguat This medicine may also interact with the following medications: -alteplase -aspirin -heparin -medicines for high blood pressure -medicines for mental depression -other medicines used to treat angina -phenothiazines like chlorpromazine, mesoridazine, prochlorperazine, thioridazine This list may not describe all possible interactions. Give your health care provider a list of all the medicines, herbs, non-prescription drugs, or dietary supplements you use. Also tell them if you smoke, drink alcohol, or use illegal drugs. Some items may interact with your medicine. What should I watch for while using this medicine? Tell your doctor or health care professional if you feel your medicine is no longer working. Keep this medicine with you at all times. Sit or lie down when you take your medicine to prevent falling if you feel dizzy or faint after using it. Try to remain calm. This will help you to feel better faster. If you feel dizzy, take several deep breaths and lie down with your feet propped up, or bend forward with your head resting between your knees. You may get drowsy or dizzy. Do not drive, use machinery, or do anything that needs mental alertness until you know how this drug affects you. Do  not stand or sit up quickly, especially if you are an older patient. This reduces the risk of dizzy or fainting spells. Alcohol can make you more drowsy and dizzy. Avoid alcoholic drinks. Do not treat yourself for coughs, colds, or pain while you are taking this medicine without asking your doctor or health care professional for advice. Some ingredients may increase your blood pressure. What side effects may I notice from receiving this medicine? Side effects that you should report to your doctor or health care professional as soon as possible: -blurred vision -dry mouth -skin rash -sweating -the feeling of extreme pressure in the head -unusually weak or tired Side effects that usually do not require medical attention (report to your doctor or health care professional if they continue or are bothersome): -flushing of the face or neck -headache -irregular heartbeat, palpitations -nausea, vomiting This list may not describe all possible side effects. Call your doctor for medical advice about side effects. You may report side effects to FDA at 1-800-FDA-1088. Where should I keep my medicine? Keep out of the reach of children. Store at room temperature between 20 and 25 degrees C (68 and 77 degrees F). Store in [facility] [employer]. Protect from light and moisture. Keep tightly closed. Throw away any unused medicine after the expiration date. NOTE: This sheet is a summary. It  may not cover all possible information. If you have questions about this medicine, talk to your doctor, pharmacist, or health care provider.  2018 Elsevier/Gold Standard (2013-08-05 17:57:36) e morning of your procedure, take your Aspirin and any morning medicines NOT listed above.  You may use sips of water.  5. Plan for one night stay--bring personal belongings. 6. Bring a current list of your medications and current insurance cards. 7. You MUST have a responsible person to drive you home. 8. Someone MUST be with you the  first 24 hours after you arrive home or your discharge will be delayed. 9. Please wear clothes that are easy to get on and off and wear slip-on shoes.  Thank you for allowing [place] to care for you!   -- [facility]

## 2018-01-14 NOTE — H&P (View-Only) (Signed)
Cardiology Office Note    Date:  01/14/2018   ID:  ASPYNN CLOVER, DOB 1965/12/19, MRN [id number]  PCP:  Shirline Frees, MD  Cardiologist:  New to Dr. Caryl Comes   Chief Complaint: chest pain and SOB  History of Present Illness:   Michele [name] is a 52 y.o. female HLD and GERD referred by Dr. Kenton Kingfisher for evaluation of chest pain SOB.  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 differential GERD vs costochondritis. EKG showed normal sinus rhythm at rate of 61 bpm. No acute abnormality. Personally reviewed.   The patient was in [place] up until March 1st when she noted chest pain. She flu to [place] to attended weeding. While arranging stuff she has noted severe fatigue and diaphoresis. Same night she had severe chest heaviness and SOB while dancing. Resoled with rest. She flu back and since then she is having substernal chest "heaviness and pressure". Occurs multiple times/day with and without exertion.  Occasionally associated with shortness of breath.  She is barely able to walk few steps before becoming dyspneic.  He denies palpitation, orthopnea, PND, syncope, lower extremity edema, dizziness or headache.  Occasionally exacerbated by bending over.  She denies tobacco smoking or illicit drug use.  She drinks red wine few days/week.  Not heavy drinker.  She previously had a normal stress echocardiogram in 2005.  It was done due to atypical chest pain.  No records available for review.  Patient has a strong family history of CAD.  Father had a MI at age 37.  Paternal grandfather had MI at age 39.  Sister had MI at age 69.  Mother sibling has history of MI as well.  Past Medical History:  Diagnosis Date  . Abdominal bloating   . Anxiety   . Back pain   . Constipation due to outlet dysfunction   . Constipation, unspecified   . DDD (degenerative disc disease), cervical   . Dorsalgia   . Dorsalgia   . GERD (gastroesophageal reflux disease)   .  Hypercholesterolemia   . Hyperlipidemia   . Intestinal gas excretion     Past Surgical History:  Procedure Laterality Date  . BASAL CELL CARCINOMA EXCISION    . BREAST BIOPSY Left    Duct removal (No scar seen)   . BREAST LUMPECTOMY Left 1992    Current Medications: Prior to Admission medications   Medication Sig Start Date End Date Taking? Authorizing Provider  b complex vitamins tablet Take 1 tablet by mouth daily.    [provider]  FLUoxetine (PROZAC) 20 MG capsule  11/01/15   [provider]  Specialty Vitamins Products (MAGNESIUM, AMINO ACID CHELATE,) 133 MG tablet Take 1 tablet by mouth daily.    [provider]  sucralfate (CARAFATE) 1 g tablet  11/10/15   [provider]    Allergies:   Crestor [rosuvastatin calcium]; Lipitor [atorvastatin calcium]; and Celebrex [celecoxib]   Social History   Socioeconomic History  . Marital status: Married    Spouse name: Not on file  . Number of children: Not on file  . Years of education: Not on file  . Highest education level: Not on file  Occupational History  . Not on file  Social Needs  . Financial resource strain: Not on file  . Food insecurity:    Worry: Not on file    Inability: Not on file  . Transportation needs:  Medical: Not on file    Non-medical: Not on file  Tobacco Use  . Smoking status: Never Smoker  Substance and Sexual Activity  . Alcohol use: Yes    Comment: rare  . Drug use: No  . Sexual activity: Not on file  Lifestyle  . Physical activity:    Days per week: Not on file    Minutes per session: Not on file  . Stress: Not on file  Relationships  . Social connections:    Talks on phone: Not on file    Gets together: Not on file    Attends religious service: Not on file    Active member of club or organization: Not on file    Attends meetings of clubs or organizations: Not on file    Relationship status: Not on file  Other Topics Concern  . Not on file    Social History Narrative  . Not on file     Family History:  The patient's family history includes Diabetes in her father and mother; Hypertension in her mother; Stroke in her maternal uncle.   ROS:   Please see the history of present illness.    ROS All other systems reviewed and are negative.   PHYSICAL EXAM:   VS:  BP 90/60   Pulse 68   Ht 5\' 2"  (1.575 m)   Wt 144 lb 12.8 oz (65.7 kg)   SpO2 99%   BMI 26.48 kg/m    GEN: Well nourished, well developed, in no acute distress  HEENT: normal  Neck: no JVD, carotid bruits, or masses Cardiac: RRR; no murmurs, rubs, or gallops,no edema  Respiratory:  clear to auscultation bilaterally, normal work of breathing GI: soft, nontender, nondistended, + BS MS: no deformity or atrophy  Skin: warm and dry, no rash Neuro:  Alert and Oriented x 3, Strength and sensation are intact Psych: euthymic mood, full affect  Wt Readings from Last 3 Encounters:  01/14/18 144 lb 12.8 oz (65.7 kg)  12/05/15 142 lb (64.4 kg)      Studies/Labs Reviewed:   EKG:  EKG is not ordered today.    Recent Labs: No results found for requested labs within last 8760 hours.   Lipid Panel No results found for: CHOL, TRIG, HDL, CHOLHDL, VLDL, LDLCALC, LDLDIRECT  Additional studies/ records that were reviewed today include:   As summarized above  ASSESSMENT & PLAN:    1. Unstable angina - Her symptoms is concerning for cardiac etiology.  Ongoing for past 3-4 weeks.  Worsened recently.  Her cardiac risk factor includes hyperlipidemia and strong family history of CAD.  Cannot rule out PE given travel history and somewhat pleuritic chest pain. -Reviewed with Dr. Caryl Comes.  Will get stat d-dimer.  If elevated she will go to ER for CT angiogram of the chest. -We will also do cardiac catheter evaluation  to rule out any ischemic etiology.  Cath Lab has no open slot for outpatient cath tomorrow.  Encourage patient to avoid strenuous activity.  Cath currently  scheduled for Friday.  Will give her as needed prescription of nitroglycerin.  She understands to go to ER if worsening of symptoms. Will get stat Troponin as well.   2. Hyperlipidemia -Statin intolerance.  She had a memory issue on Lipitor and leg cramp on Crestor.  Due to her ongoing symptoms, she took Crestor 2.5 mg yesterday.  Will review pending cath results.   Medication Adjustments/Labs and Tests Ordered: Current medicines are reviewed at length with  the patient today.  Concerns regarding medicines are outlined above.  Medication changes, Labs and Tests ordered today are listed in the Patient Instructions below. Patient Instructions  Your physician recommends that you continue on your current medications as directed. Please refer to the Current Medication list given to you today.   Your physician recommends that you return for lab work in:[facility] OFFICE [address] [place] [address] Dept: [phone] Loc: [facility]  01/14/2018  You are scheduled for a Cardiac Catheterization on Friday, March 29 with Dr. Harrell Gave End.  1. Please arrive at the [facility] (Main Entrance A) at [facility]: [place], [place] [address] at 11:30 AM (two hours before your procedure to ensure your preparation). Free valet parking service is available.   Special note: Every effort is made to have your procedure done on time. Please understand that emergencies sometimes delay scheduled procedures.  2. Diet: You may have a clear liquid breakfast, but nothing after 8 AM on your procedure day.  3. Labs: You will need to have blood drawn on Wednesday, March 27 at [facility]. [address]  Open: 7:30am - 5pm    Phone: [phone]. You do not need to be fasting.  4. Medication  instructions in preparation for your procedure:      Current Outpatient Medications (Analgesics):  .  aspirin EC 81 MG tablet, Take 81 mg by mouth daily.   Current Outpatient Medications (Other):  .  b complex vitamins tablet, Take 1 tablet by mouth daily. Marland Kitchen  FLUoxetine (PROZAC) 20 MG tablet, Take 20 mg by mouth daily. Marland Kitchen  Specialty Vitamins Products (MAGNESIUM, AMINO ACID CHELATE,) 133 MG tablet, Take 1 tablet by mouth daily. *For reference purposes while preparing patient instructions.   Delete this med list prior to printing instructions for patient.* Nitroglycerin sublingual tablets What is this medicine? NITROGLYCERIN (nye troe GLI ser in) is a type of vasodilator. It relaxes blood vessels, increasing the blood and oxygen supply to your heart. This medicine is used to relieve chest pain caused by angina. It is also used to prevent chest pain before activities like climbing stairs, going outdoors in cold weather, or sexual activity. This medicine may be used for other purposes; ask your health care provider or pharmacist if you have questions. COMMON BRAND NAME(S): Nitroquick, Nitrostat, Nitrotab What should I tell my health care provider before I take this medicine? They need to know if you have any of these conditions: -anemia -head injury, recent stroke, or bleeding in the brain -liver disease -previous heart attack -an unusual or allergic reaction to nitroglycerin, other medicines, foods, dyes, or preservatives -pregnant or trying to get pregnant -breast-feeding How should I use this medicine? Take this medicine by mouth as needed. At the first sign of an angina attack (chest pain or tightness) place one tablet under your tongue. You can also take this medicine 5 to 10 minutes before an event likely to produce chest pain. Follow the directions on the prescription label. Let the tablet dissolve under the tongue. Do not swallow whole. Replace the dose if you accidentally swallow  it. It will help if your mouth is not dry. Saliva around the tablet will help it to dissolve more quickly. Do not eat or drink, smoke or chew tobacco  while a tablet is dissolving. If you are not better within 5 minutes after taking ONE dose of nitroglycerin, call 9-1-1 immediately to seek emergency medical care. Do not take more than 3 nitroglycerin tablets over 15 minutes. If you take this medicine often to relieve symptoms of angina, your doctor or health care professional may provide you with different instructions to manage your symptoms. If symptoms do not go away after following these instructions, it is important to call 9-1-1 immediately. Do not take more than 3 nitroglycerin tablets over 15 minutes. Talk to your pediatrician regarding the use of this medicine in children. Special care may be needed. Overdosage: If you think you have taken too much of this medicine contact a poison control center or emergency room at once. NOTE: This medicine is only for you. Do not share this medicine with others. What if I miss a dose? This does not apply. This medicine is only used as needed. What may interact with this medicine? Do not take this medicine with any of the following medications: -certain migraine medicines like ergotamine and dihydroergotamine (DHE) -medicines used to treat erectile dysfunction like sildenafil, tadalafil, and vardenafil -riociguat This medicine may also interact with the following medications: -alteplase -aspirin -heparin -medicines for high blood pressure -medicines for mental depression -other medicines used to treat angina -phenothiazines like chlorpromazine, mesoridazine, prochlorperazine, thioridazine This list may not describe all possible interactions. Give your health care provider a list of all the medicines, herbs, non-prescription drugs, or dietary supplements you use. Also tell them if you smoke, drink alcohol, or use illegal drugs. Some items may interact  with your medicine. What should I watch for while using this medicine? Tell your doctor or health care professional if you feel your medicine is no longer working. Keep this medicine with you at all times. Sit or lie down when you take your medicine to prevent falling if you feel dizzy or faint after using it. Try to remain calm. This will help you to feel better faster. If you feel dizzy, take several deep breaths and lie down with your feet propped up, or bend forward with your head resting between your knees. You may get drowsy or dizzy. Do not drive, use machinery, or do anything that needs mental alertness until you know how this drug affects you. Do not stand or sit up quickly, especially if you are an older patient. This reduces the risk of dizzy or fainting spells. Alcohol can make you more drowsy and dizzy. Avoid alcoholic drinks. Do not treat yourself for coughs, colds, or pain while you are taking this medicine without asking your doctor or health care professional for advice. Some ingredients may increase your blood pressure. What side effects may I notice from receiving this medicine? Side effects that you should report to your doctor or health care professional as soon as possible: -blurred vision -dry mouth -skin rash -sweating -the feeling of extreme pressure in the head -unusually weak or tired Side effects that usually do not require medical attention (report to your doctor or health care professional if they continue or are bothersome): -flushing of the face or neck -headache -irregular heartbeat, palpitations -nausea, vomiting This list may not describe all possible side effects. Call your doctor for medical advice about side effects. You may report side effects to FDA at 1-800-FDA-1088. Where should I keep my medicine? Keep out of the reach of children. Store at room temperature between 20 and 25 degrees C (68 and 77 degrees F).  Store in [facility] [employer]. Protect from  light and moisture. Keep tightly closed. Throw away any unused medicine after the expiration date. NOTE: This sheet is a summary. It may not cover all possible information. If you have questions about this medicine, talk to your doctor, pharmacist, or health care provider.  2018 Elsevier/Gold Standard (2013-08-05 17:57:36) e morning of your procedure, take your Aspirin and any morning medicines NOT listed above.  You may use sips of water.  5. Plan for one night stay--bring personal belongings. 6. Bring a current list of your medications and current insurance cards. 7. You MUST have a responsible person to drive you home. 8. Someone MUST be with you the first 24 hours after you arrive home or your discharge will be delayed. 9. Please wear clothes that are easy to get on and off and wear slip-on shoes.  Thank you for allowing [place] to care for you!   -- [facility] Invasive Cardiovascular services    Signed, Leanor Kail, [place]  01/14/2018 4:06 PM    [address], [place], [place]  [address] Phone: [phone]; Fax: [phone]

## 2018-01-14 NOTE — Telephone Encounter (Signed)
Patient calling, states that she spoke with [facility] about her cath.  Patient was told by [facility] that our office would need to call and verify that it is needed. [facility] number is [phone].

## 2018-01-14 NOTE — Telephone Encounter (Signed)
Called pt to let her know her Troponin and d dimer were WNL.  Kerin Ransom PA-C 01/14/2018 8:11 PM

## 2018-01-14 NOTE — Progress Notes (Addendum)
Cardiology Office Note    Date:  01/14/2018   ID:  Michele [name], DOB 1966/02/07, MRN [id number]  PCP:  Shirline Frees, MD  Cardiologist:  New to Dr. Caryl Comes   Chief Complaint: chest pain and SOB  History of Present Illness:   Michele [name] is a 52 y.o. female HLD and GERD referred by Dr. Kenton Kingfisher for evaluation of chest pain SOB.  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 differential GERD vs costochondritis. EKG showed normal sinus rhythm at rate of 61 bpm. No acute abnormality. Personally reviewed.   The patient was in [place] up until March 1st when she noted chest pain. She flu to [place] to attended weeding. While arranging stuff she has noted severe fatigue and diaphoresis. Same night she had severe chest heaviness and SOB while dancing. Resoled with rest. She flu back and since then she is having substernal chest "heaviness and pressure". Occurs multiple times/day with and without exertion.  Occasionally associated with shortness of breath.  She is barely able to walk few steps before becoming dyspneic.  He denies palpitation, orthopnea, PND, syncope, lower extremity edema, dizziness or headache.  Occasionally exacerbated by bending over.  She denies tobacco smoking or illicit drug use.  She drinks red wine few days/week.  Not heavy drinker.  She previously had a normal stress echocardiogram in 2005.  It was done due to atypical chest pain.  No records available for review.  Patient has a strong family history of CAD.  Father had a MI at age 80.  Paternal grandfather had MI at age 49.  Sister had MI at age 39.  Mother sibling has history of MI as well.  Past Medical History:  Diagnosis Date  . Abdominal bloating   . Anxiety   . Back pain   . Constipation due to outlet dysfunction   . Constipation, unspecified   . DDD (degenerative disc disease), cervical   . Dorsalgia   . Dorsalgia   . GERD (gastroesophageal reflux disease)   .  Hypercholesterolemia   . Hyperlipidemia   . Intestinal gas excretion     Past Surgical History:  Procedure Laterality Date  . BASAL CELL CARCINOMA EXCISION    . BREAST BIOPSY Left    Duct removal (No scar seen)   . BREAST LUMPECTOMY Left 1992    Current Medications: Prior to Admission medications   Medication Sig Start Date End Date Taking? Authorizing Provider  b complex vitamins tablet Take 1 tablet by mouth daily.    [provider]  FLUoxetine (PROZAC) 20 MG capsule  11/01/15   [provider]  Specialty Vitamins Products (MAGNESIUM, AMINO ACID CHELATE,) 133 MG tablet Take 1 tablet by mouth daily.    [provider]  sucralfate (CARAFATE) 1 g tablet  11/10/15   [provider]    Allergies:   Crestor [rosuvastatin calcium]; Lipitor [atorvastatin calcium]; and Celebrex [celecoxib]   Social History   Socioeconomic History  . Marital status: Married    Spouse name: Not on file  . Number of children: Not on file  . Years of education: Not on file  . Highest education level: Not on file  Occupational History  . Not on file  Social Needs  . Financial resource strain: Not on file  . Food insecurity:    Worry: Not on file    Inability: Not on file  . Transportation needs:  Medical: Not on file    Non-medical: Not on file  Tobacco Use  . Smoking status: Never Smoker  Substance and Sexual Activity  . Alcohol use: Yes    Comment: rare  . Drug use: No  . Sexual activity: Not on file  Lifestyle  . Physical activity:    Days per week: Not on file    Minutes per session: Not on file  . Stress: Not on file  Relationships  . Social connections:    Talks on phone: Not on file    Gets together: Not on file    Attends religious service: Not on file    Active member of club or organization: Not on file    Attends meetings of clubs or organizations: Not on file    Relationship status: Not on file  Other Topics Concern  . Not on file    Social History Narrative  . Not on file     Family History:  The patient's family history includes Diabetes in her father and mother; Hypertension in her mother; Stroke in her maternal uncle.   ROS:   Please see the history of present illness.    ROS All other systems reviewed and are negative.   PHYSICAL EXAM:   VS:  BP 90/60   Pulse 68   Ht 5\' 2"  (1.575 m)   Wt 144 lb 12.8 oz (65.7 kg)   SpO2 99%   BMI 26.48 kg/m    GEN: Well nourished, well developed, in no acute distress  HEENT: normal  Neck: no JVD, carotid bruits, or masses Cardiac: RRR; no murmurs, rubs, or gallops,no edema  Respiratory:  clear to auscultation bilaterally, normal work of breathing GI: soft, nontender, nondistended, + BS MS: no deformity or atrophy  Skin: warm and dry, no rash Neuro:  Alert and Oriented x 3, Strength and sensation are intact Psych: euthymic mood, full affect  Wt Readings from Last 3 Encounters:  01/14/18 144 lb 12.8 oz (65.7 kg)  12/05/15 142 lb (64.4 kg)      Studies/Labs Reviewed:   EKG:  EKG is not ordered today.    Recent Labs: No results found for requested labs within last 8760 hours.   Lipid Panel No results found for: CHOL, TRIG, HDL, CHOLHDL, VLDL, LDLCALC, LDLDIRECT  Additional studies/ records that were reviewed today include:   As summarized above  ASSESSMENT & PLAN:    1. Unstable angina - Her symptoms is concerning for cardiac etiology.  Ongoing for past 3-4 weeks.  Worsened recently.  Her cardiac risk factor includes hyperlipidemia and strong family history of CAD.  Cannot rule out PE given travel history and somewhat pleuritic chest pain. -Reviewed with Dr. Caryl Comes.  Will get stat d-dimer.  If elevated she will go to ER for CT angiogram of the chest. -We will also do cardiac catheter evaluation  to rule out any ischemic etiology.  Cath Lab has no open slot for outpatient cath tomorrow.  Encourage patient to avoid strenuous activity.  Cath currently  scheduled for Friday.  Will give her as needed prescription of nitroglycerin.  She understands to go to ER if worsening of symptoms. Will get stat Troponin as well.   2. Hyperlipidemia -Statin intolerance.  She had a memory issue on Lipitor and leg cramp on Crestor.  Due to her ongoing symptoms, she took Crestor 2.5 mg yesterday.  Will review pending cath results.   Medication Adjustments/Labs and Tests Ordered: Current medicines are reviewed at length with  the patient today.  Concerns regarding medicines are outlined above.  Medication changes, Labs and Tests ordered today are listed in the Patient Instructions below. Patient Instructions  Your physician recommends that you continue on your current medications as directed. Please refer to the Current Medication list given to you today.   Your physician recommends that you return for lab work in:[facility] [place] [place] OFFICE [address], [place] [address] Dept: [phone] Loc: [place]  01/14/2018  You are scheduled for a Cardiac Catheterization on Friday, March 29 with Dr. Harrell Gave End.  1. Please arrive at the [facility] (Main Entrance A) at [facility]: [place], [place] [address] at 11:30 AM (two hours before your procedure to ensure your preparation). Free valet parking service is available.   Special note: Every effort is made to have your procedure done on time. Please understand that emergencies sometimes delay scheduled procedures.  2. Diet: You may have a clear liquid breakfast, but nothing after 8 AM on your procedure day.  3. Labs: You will need to have blood drawn on Wednesday, March 27 at [facility] [address]  Open: 7:30am - 5pm    Phone: [phone]. You do not need to be fasting.  4. Medication  instructions in preparation for your procedure:      Current Outpatient Medications (Analgesics):  .  aspirin EC 81 MG tablet, Take 81 mg by mouth daily.   Current Outpatient Medications (Other):  .  b complex vitamins tablet, Take 1 tablet by mouth daily. Marland Kitchen  FLUoxetine (PROZAC) 20 MG tablet, Take 20 mg by mouth daily. Marland Kitchen  Specialty Vitamins Products (MAGNESIUM, AMINO ACID CHELATE,) 133 MG tablet, Take 1 tablet by mouth daily. *For reference purposes while preparing patient instructions.   Delete this med list prior to printing instructions for patient.* Nitroglycerin sublingual tablets What is this medicine? NITROGLYCERIN (nye troe GLI ser in) is a type of vasodilator. It relaxes blood vessels, increasing the blood and oxygen supply to your heart. This medicine is used to relieve chest pain caused by angina. It is also used to prevent chest pain before activities like climbing stairs, going outdoors in cold weather, or sexual activity. This medicine may be used for other purposes; ask your health care provider or pharmacist if you have questions. COMMON BRAND NAME(S): Nitroquick, Nitrostat, Nitrotab What should I tell my health care provider before I take this medicine? They need to know if you have any of these conditions: -anemia -head injury, recent stroke, or bleeding in the brain -liver disease -previous heart attack -an unusual or allergic reaction to nitroglycerin, other medicines, foods, dyes, or preservatives -pregnant or trying to get pregnant -breast-feeding How should I use this medicine? Take this medicine by mouth as needed. At the first sign of an angina attack (chest pain or tightness) place one tablet under your tongue. You can also take this medicine 5 to 10 minutes before an event likely to produce chest pain. Follow the directions on the prescription label. Let the tablet dissolve under the tongue. Do not swallow whole. Replace the dose if you accidentally swallow  it. It will help if your mouth is not dry. Saliva around the tablet will help it to dissolve more quickly. Do not eat or drink, smoke or chew tobacco  while a tablet is dissolving. If you are not better within 5 minutes after taking ONE dose of nitroglycerin, call 9-1-1 immediately to seek emergency medical care. Do not take more than 3 nitroglycerin tablets over 15 minutes. If you take this medicine often to relieve symptoms of angina, your doctor or health care professional may provide you with different instructions to manage your symptoms. If symptoms do not go away after following these instructions, it is important to call 9-1-1 immediately. Do not take more than 3 nitroglycerin tablets over 15 minutes. Talk to your pediatrician regarding the use of this medicine in children. Special care may be needed. Overdosage: If you think you have taken too much of this medicine contact a poison control center or emergency room at once. NOTE: This medicine is only for you. Do not share this medicine with others. What if I miss a dose? This does not apply. This medicine is only used as needed. What may interact with this medicine? Do not take this medicine with any of the following medications: -certain migraine medicines like ergotamine and dihydroergotamine (DHE) -medicines used to treat erectile dysfunction like sildenafil, tadalafil, and vardenafil -riociguat This medicine may also interact with the following medications: -alteplase -aspirin -heparin -medicines for high blood pressure -medicines for mental depression -other medicines used to treat angina -phenothiazines like chlorpromazine, mesoridazine, prochlorperazine, thioridazine This list may not describe all possible interactions. Give your health care provider a list of all the medicines, herbs, non-prescription drugs, or dietary supplements you use. Also tell them if you smoke, drink alcohol, or use illegal drugs. Some items may interact  with your medicine. What should I watch for while using this medicine? Tell your doctor or health care professional if you feel your medicine is no longer working. Keep this medicine with you at all times. Sit or lie down when you take your medicine to prevent falling if you feel dizzy or faint after using it. Try to remain calm. This will help you to feel better faster. If you feel dizzy, take several deep breaths and lie down with your feet propped up, or bend forward with your head resting between your knees. You may get drowsy or dizzy. Do not drive, use machinery, or do anything that needs mental alertness until you know how this drug affects you. Do not stand or sit up quickly, especially if you are an older patient. This reduces the risk of dizzy or fainting spells. Alcohol can make you more drowsy and dizzy. Avoid alcoholic drinks. Do not treat yourself for coughs, colds, or pain while you are taking this medicine without asking your doctor or health care professional for advice. Some ingredients may increase your blood pressure. What side effects may I notice from receiving this medicine? Side effects that you should report to your doctor or health care professional as soon as possible: -blurred vision -dry mouth -skin rash -sweating -the feeling of extreme pressure in the head -unusually weak or tired Side effects that usually do not require medical attention (report to your doctor or health care professional if they continue or are bothersome): -flushing of the face or neck -headache -irregular heartbeat, palpitations -nausea, vomiting This list may not describe all possible side effects. Call your doctor for medical advice about side effects. You may report side effects to FDA at 1-800-FDA-1088. Where should I keep my medicine? Keep out of the reach of children. Store at room temperature between 20 and 25 degrees C (68 and 77 degrees F).  Store in [employer] of [employer]. Protect from  light and moisture. Keep tightly closed. Throw away any unused medicine after the expiration date. NOTE: This sheet is a summary. It may not cover all possible information. If you have questions about this medicine, talk to your doctor, pharmacist, or health care provider.  2018 Elsevier/Gold Standard (2013-08-05 17:57:36) e morning of your procedure, take your Aspirin and any morning medicines NOT listed above.  You may use sips of water.  5. Plan for one night stay--bring personal belongings. 6. Bring a current list of your medications and current insurance cards. 7. You MUST have a responsible person to drive you home. 8. Someone MUST be with you the first 24 hours after you arrive home or your discharge will be delayed. 9. Please wear clothes that are easy to get on and off and wear slip-on shoes.  Thank you for allowing [place] to care for you!   -- [facility]    Signed, Leanor Kail, [place]  01/14/2018 4:06 PM    [address], [place], [place]  [address] Phone: [phone]; Fax: [phone]

## 2018-01-14 NOTE — Addendum Note (Signed)
Addended by: Devra Dopp E on: 01/14/2018 04:17 PM   Modules accepted: Orders

## 2018-01-15 ENCOUNTER — Telehealth: Payer: Self-pay | Admitting: *Deleted

## 2018-01-15 LAB — CBC WITH DIFFERENTIAL/PLATELET
BASOS: 1 %
Basophils Absolute: 0.1 10*3/uL (ref 0.0–0.2)
EOS (ABSOLUTE): 0.1 10*3/uL (ref 0.0–0.4)
EOS: 1 %
HEMATOCRIT: 33.9 % — AB (ref 34.0–46.6)
Hemoglobin: 10.8 g/dL — ABNORMAL LOW (ref 11.1–15.9)
Immature Grans (Abs): 0 10*3/uL (ref 0.0–0.1)
Immature Granulocytes: 0 %
LYMPHS ABS: 2.4 10*3/uL (ref 0.7–3.1)
Lymphs: 35 %
MCH: 28.1 pg (ref 26.6–33.0)
MCHC: 31.9 g/dL (ref 31.5–35.7)
MCV: 88 fL (ref 79–97)
MONOS ABS: 0.4 10*3/uL (ref 0.1–0.9)
Monocytes: 7 %
NEUTROS ABS: 3.8 10*3/uL (ref 1.4–7.0)
Neutrophils: 56 %
Platelets: 407 10*3/uL — ABNORMAL HIGH (ref 150–379)
RBC: 3.84 x10E6/uL (ref 3.77–5.28)
RDW: 15.1 % (ref 12.3–15.4)
WBC: 6.8 10*3/uL (ref 3.4–10.8)

## 2018-01-15 LAB — BASIC METABOLIC PANEL
BUN/Creatinine Ratio: 17 (ref 9–23)
BUN: 14 mg/dL (ref 6–24)
CO2: 24 mmol/L (ref 20–29)
Calcium: 9.5 mg/dL (ref 8.7–10.2)
Chloride: 105 mmol/L (ref 96–106)
Creatinine, Ser: 0.84 mg/dL (ref 0.57–1.00)
GFR, EST AFRICAN AMERICAN: 92 mL/min/{1.73_m2} (ref >59)
GFR, EST NON AFRICAN AMERICAN: 80 mL/min/{1.73_m2} (ref >59)
Glucose: 102 mg/dL — ABNORMAL HIGH (ref 65–99)
POTASSIUM: 4.2 mmol/L (ref 3.5–5.2)
SODIUM: 141 mmol/L (ref 134–144)

## 2018-01-15 NOTE — Telephone Encounter (Signed)
Pt contacted pre-catheterization scheduled at [facility] for: Friday January 16, 2018 1:30 PM Verified arrival time and place: [place] Entrance A/[place] at: 11:30 AM Nothing to eat after midnight prior to cath. Clear liquids until 7 AM, then nothing to eat or drink except medications with sips of water. Verified allergies in Epic.  Verified no diabetes medications.   AM meds can be  taken pre-cath with sip of water including: ASA 81 mg   Confirmed patient has responsible person to drive home post procedure and observe patient for 24 hours: yes

## 2018-01-15 NOTE — Telephone Encounter (Signed)
Pt is aware that even that the D-dimer and Protonix labs are  WNL  She still needs to have the left cardiac cath. Pt wants for the office to call her insurance [employer] for verification. [employer] was called, information needed was given. Pt is aware.

## 2018-01-15 NOTE — Telephone Encounter (Signed)
Follow up   Pt is calling to see if she still needs to have her cath done since her labs were good and if so, [employer] needs verification still. Please call

## 2018-01-16 ENCOUNTER — Ambulatory Visit (HOSPITAL_COMMUNITY)
Admission: RE | Disposition: A | Discharge status: Home / Self Care | Payer: Self-pay | Source: Ambulatory Visit | Attending: Internal Medicine

## 2018-01-16 ENCOUNTER — Ambulatory Visit (HOSPITAL_COMMUNITY)
Admission: RE | Admit: 2018-01-16 | Discharge: 2018-01-16 | Disposition: A | Discharge status: Home / Self Care | Payer: No Typology Code available for payment source | Source: Ambulatory Visit | Attending: Internal Medicine | Admitting: Internal Medicine

## 2018-01-16 DIAGNOSIS — M5030 Other cervical disc degeneration, unspecified cervical region: Secondary | ICD-10-CM | POA: Insufficient documentation

## 2018-01-16 DIAGNOSIS — R0789 Other chest pain: Secondary | ICD-10-CM | POA: Diagnosis not present

## 2018-01-16 DIAGNOSIS — M549 Dorsalgia, unspecified: Secondary | ICD-10-CM | POA: Insufficient documentation

## 2018-01-16 DIAGNOSIS — Z8249 Family history of ischemic heart disease and other diseases of the circulatory system: Secondary | ICD-10-CM | POA: Diagnosis not present

## 2018-01-16 DIAGNOSIS — E7800 Pure hypercholesterolemia, unspecified: Secondary | ICD-10-CM | POA: Diagnosis not present

## 2018-01-16 DIAGNOSIS — K219 Gastro-esophageal reflux disease without esophagitis: Secondary | ICD-10-CM | POA: Insufficient documentation

## 2018-01-16 DIAGNOSIS — F419 Anxiety disorder, unspecified: Secondary | ICD-10-CM | POA: Insufficient documentation

## 2018-01-16 DIAGNOSIS — I200 Unstable angina: Secondary | ICD-10-CM

## 2018-01-16 HISTORY — PX: LEFT HEART CATH AND CORONARY ANGIOGRAPHY: CATH118249

## 2018-01-16 LAB — PREGNANCY, URINE: Preg Test, Ur: NEGATIVE

## 2018-01-16 LAB — PROTIME-INR
INR: 1.02
PROTHROMBIN TIME: 13.3 s (ref 11.4–15.2)

## 2018-01-16 SURGERY — LEFT HEART CATH AND CORONARY ANGIOGRAPHY
Anesthesia: LOCAL

## 2018-01-16 MED ORDER — ACETAMINOPHEN 325 MG PO TABS: 650.0000 mg | ORAL_TABLET | ORAL | Status: DC | PRN

## 2018-01-16 MED ORDER — LIDOCAINE HCL 1 % IJ SOLN
INTRAMUSCULAR | Status: AC
  Filled 2018-01-16: qty 20

## 2018-01-16 MED ORDER — SODIUM CHLORIDE 0.9% FLUSH: 3.0000 mL | INTRAVENOUS | Status: DC | PRN

## 2018-01-16 MED ORDER — FENTANYL CITRATE (PF) 100 MCG/2ML IJ SOLN
INTRAMUSCULAR | Status: AC
  Filled 2018-01-16: qty 2

## 2018-01-16 MED ORDER — SODIUM CHLORIDE 0.9 % IV SOLN: 250.0000 mL | INTRAVENOUS | Status: DC | PRN

## 2018-01-16 MED ORDER — HEPARIN (PORCINE) IN NACL 2-0.9 UNIT/ML-% IJ SOLN
INTRAMUSCULAR | Status: AC
  Filled 2018-01-16: qty 1000

## 2018-01-16 MED ORDER — SODIUM CHLORIDE 0.9 % WEIGHT BASED INFUSION: 1.0000 mL/kg/h | INTRAVENOUS | Status: DC

## 2018-01-16 MED ORDER — HEPARIN SODIUM (PORCINE) 1000 UNIT/ML IJ SOLN
INTRAMUSCULAR | Status: AC
  Filled 2018-01-16: qty 1

## 2018-01-16 MED ORDER — SODIUM CHLORIDE 0.9% FLUSH: 3.0000 mL | Freq: Two times a day (BID) | INTRAVENOUS | Status: DC

## 2018-01-16 MED ORDER — IOPAMIDOL (ISOVUE-370) INJECTION 76%
INTRAVENOUS | Status: DC | PRN
  Administered 2018-01-16: 70 mL via INTRA_ARTERIAL

## 2018-01-16 MED ORDER — HEPARIN SODIUM (PORCINE) 1000 UNIT/ML IJ SOLN
INTRAMUSCULAR | Status: DC | PRN
  Administered 2018-01-16: 3000 [IU] via INTRAVENOUS

## 2018-01-16 MED ORDER — SODIUM CHLORIDE 0.9 % IV SOLN
INTRAVENOUS | Status: DC
  Administered 2018-01-16: 15:00:00 via INTRAVENOUS

## 2018-01-16 MED ORDER — MIDAZOLAM HCL 2 MG/2ML IJ SOLN
INTRAMUSCULAR | Status: DC | PRN
  Administered 2018-01-16 (×2): 1 mg via INTRAVENOUS

## 2018-01-16 MED ORDER — LIDOCAINE HCL (PF) 1 % IJ SOLN
INTRAMUSCULAR | Status: DC | PRN
  Administered 2018-01-16: 2 mL via INTRADERMAL

## 2018-01-16 MED ORDER — FENTANYL CITRATE (PF) 100 MCG/2ML IJ SOLN
INTRAMUSCULAR | Status: DC | PRN
  Administered 2018-01-16 (×2): 25 ug via INTRAVENOUS

## 2018-01-16 MED ORDER — VERAPAMIL HCL 2.5 MG/ML IV SOLN
INTRAVENOUS | Status: DC | PRN
  Administered 2018-01-16: 10 mL via INTRA_ARTERIAL

## 2018-01-16 MED ORDER — SODIUM CHLORIDE 0.9 % WEIGHT BASED INFUSION
3.0000 mL/kg/h | INTRAVENOUS | Status: AC
  Administered 2018-01-16: 3 mL/kg/h via INTRAVENOUS

## 2018-01-16 MED ORDER — ASPIRIN 81 MG PO CHEW: 81.0000 mg | CHEWABLE_TABLET | ORAL | Status: DC

## 2018-01-16 MED ORDER — MIDAZOLAM HCL 2 MG/2ML IJ SOLN
INTRAMUSCULAR | Status: AC
  Filled 2018-01-16: qty 2

## 2018-01-16 MED ORDER — ONDANSETRON HCL 4 MG/2ML IJ SOLN: 4.0000 mg | Freq: Four times a day (QID) | INTRAMUSCULAR | Status: DC | PRN

## 2018-01-16 MED ORDER — HEPARIN (PORCINE) IN NACL 2-0.9 UNIT/ML-% IJ SOLN
INTRAMUSCULAR | Status: AC | PRN
  Administered 2018-01-16 (×2): 500 mL

## 2018-01-16 MED ORDER — VERAPAMIL HCL 2.5 MG/ML IV SOLN
INTRAVENOUS | Status: AC
  Filled 2018-01-16: qty 2

## 2018-01-16 SURGICAL SUPPLY — 13 items
BAND CMPR LRG ZPHR (HEMOSTASIS) ×1
BAND ZEPHYR COMPRESS 30 LONG (HEMOSTASIS) ×2 IMPLANT
CATH IMPULSE 5F ANG/FL3.5 (CATHETERS) ×2 IMPLANT
GUIDEWIRE INQWIRE 1.5J.035X260 (WIRE) IMPLANT
INQWIRE 1.5J .035X260CM (WIRE) ×2
KIT HEART LEFT (KITS) ×2 IMPLANT
NEEDLE PERC 21GX4CM (NEEDLE) ×2 IMPLANT
PACK CARDIAC CATHETERIZATION (CUSTOM PROCEDURE TRAY) ×2 IMPLANT
SHEATH RAIN RADIAL 21G 6FR (SHEATH) ×2 IMPLANT
SYR MEDRAD MARK V 150ML (SYRINGE) ×2 IMPLANT
TRANSDUCER W/STOPCOCK (MISCELLANEOUS) ×2 IMPLANT
TUBING CIL FLEX 10 FLL-RA (TUBING) ×2 IMPLANT
WIRE HI TORQ VERSACORE-J 145CM (WIRE) ×2 IMPLANT

## 2018-01-16 NOTE — Discharge Instructions (Signed)

## 2018-01-16 NOTE — Research (Signed)
CADFEM Informed Consent   Subject Name: Michele [name]  Subject met inclusion and exclusion criteria.  The informed consent form, study requirements and expectations were reviewed with the subject and questions and concerns were addressed prior to the signing of the consent form.  The subject verbalized understanding of the trail requirements.  The subject agreed to participate in the CADFEM trial and signed the informed consent.  The informed consent was obtained prior to performance of any protocol-specific procedures for the subject.  A copy of the signed informed consent was given to the subject and a copy was placed in the subject's medical record.  Christena Flake 01/16/2018, 12:00 PM

## 2018-01-16 NOTE — Research (Signed)
OPTIMIZE Informed Consent   Subject Name: AIDYN SPORTSMAN  Subject met inclusion and exclusion criteria.  The informed consent form, study requirements and expectations were reviewed with the subject and questions and concerns were addressed prior to the signing of the consent form.  The subject verbalized understanding of the trail requirements.  The subject agreed to participate in the OPTIMIZE trial and signed the informed consent.  The informed consent was obtained prior to performance of any protocol-specific procedures for the subject.  A copy of the signed informed consent was given to the subject and a copy was placed in the subject's medical record.  Hedrick,Tammy W 01/16/2018, 1400

## 2018-01-16 NOTE — Interval H&P Note (Signed)
History and Physical Interval Note:  01/16/2018 12:33 PM  Michele [name]  has presented today for cardiac catheterization, with the diagnosis of unstable angina  The various methods of treatment have been discussed with the patient and family. After consideration of risks, benefits and other options for treatment, the patient has consented to  Procedure(s): LEFT HEART CATH AND CORONARY ANGIOGRAPHY (N/A) as a surgical intervention .  The patient's history has been reviewed, patient examined, no change in status, stable for surgery.  I have reviewed the patient's chart and labs.  Questions were answered to the patient's satisfaction.    Cath Lab Visit (complete for each Cath Lab visit)  Clinical Evaluation Leading to the Procedure:   ACS: No.  Non-ACS:    Anginal Classification: CCS III  Anti-ischemic medical therapy: No Therapy  Non-Invasive Test Results: No non-invasive testing performed  Prior CABG: No previous CABG  [name]

## 2018-01-19 ENCOUNTER — Encounter (HOSPITAL_COMMUNITY): Payer: Self-pay | Admitting: Internal Medicine

## 2018-01-19 MED FILL — Heparin Sodium (Porcine) 2 Unit/ML in Sodium Chloride 0.9%: INTRAMUSCULAR | Qty: 1000 | Status: AC

## 2018-01-19 MED FILL — Lidocaine HCl Local Inj 1%: INTRAMUSCULAR | Qty: 20 | Status: AC

## 2018-01-29 ENCOUNTER — Encounter: Payer: Self-pay | Admitting: Neurology

## 2018-01-30 ENCOUNTER — Encounter: Payer: Self-pay | Admitting: Physician Assistant

## 2018-02-03 ENCOUNTER — Ambulatory Visit: Payer: No Typology Code available for payment source | Admitting: Neurology

## 2018-02-10 ENCOUNTER — Encounter: Payer: Self-pay | Admitting: Physician Assistant

## 2018-02-10 ENCOUNTER — Ambulatory Visit: Payer: No Typology Code available for payment source | Admitting: Physician Assistant

## 2018-02-10 VITALS — BP 86/58 | HR 81 | Ht 62.5 in | Wt 144.8 lb

## 2018-02-10 DIAGNOSIS — Z8249 Family history of ischemic heart disease and other diseases of the circulatory system: Secondary | ICD-10-CM | POA: Diagnosis not present

## 2018-02-10 DIAGNOSIS — E782 Mixed hyperlipidemia: Secondary | ICD-10-CM | POA: Diagnosis not present

## 2018-02-10 DIAGNOSIS — K224 Dyskinesia of esophagus: Secondary | ICD-10-CM

## 2018-02-10 DIAGNOSIS — R42 Dizziness and giddiness: Secondary | ICD-10-CM | POA: Diagnosis not present

## 2018-02-10 NOTE — Patient Instructions (Signed)
Medication Instructions:  Your physician recommends that you continue on your current medications as directed. Please refer to the Current Medication list given to you today.   Labwork: NONE ORDERED TODAY  Testing/Procedures: NONE ORDERED TODAY  Follow-Up: FOLLOW UP AS NEEDED  Any Other Special Instructions Will Be Listed Below (If Applicable).     If you need a refill on your cardiac medications before your next appointment, please call your pharmacy.    

## 2018-02-10 NOTE — Progress Notes (Signed)
Cardiology Office Note    Date:  02/10/2018   ID:  Michele [name], DOB 04-21-1966, MRN [id number]  PCP:  Shirline Frees, MD  Cardiologist: Dr. Caryl Comes   Chief Complaint: Cath follow up  History of Present Illness:   Michele [name] is a 52 y.o. female HLD and GERD presents for follow up.   She is nurse at [employer]. Patient has a strong family history of CAD.  Father had a MI at age 60.  Paternal grandfather had MI at age 22.  Sister had MI at age 38.  Mother sibling has history of MI as well.  Seen by me 01/14/18 for chest pain concerning for angina. D-dimer was negative. Cath showed no angiographic evidence of CAD.   Here today for follow up. Prior seen by GI for swallowing issue. Normal barium swallow. She took OTC zetec without any improvement then she tried Prevacid with minimal reliefs. Yesterday she had coffee first time after her cath and noted recurrent chest. No dyspnea, orthopnea, PND, syncope, LE edema or melena. Complains of intermittent edema. Seems with head movement. Previous seen by ENT for R ear pain and felt may be related to trigeminal neuralgia. Back to work without any limitations.  Past Medical History:  Diagnosis Date  . Abdominal bloating   . Anxiety   . Back pain   . Constipation due to outlet dysfunction   . Constipation, unspecified   . DDD (degenerative disc disease), cervical   . Dorsalgia   . Dorsalgia   . GERD (gastroesophageal reflux disease)   . Hypercholesterolemia   . Hyperlipidemia   . Intestinal gas excretion     Past Surgical History:  Procedure Laterality Date  . BASAL CELL CARCINOMA EXCISION    . BREAST BIOPSY Left    Duct removal (No scar seen)   . BREAST LUMPECTOMY Left 1992  . LEFT HEART CATH AND CORONARY ANGIOGRAPHY N/A 01/16/2018   Procedure: LEFT HEART CATH AND CORONARY ANGIOGRAPHY;  Surgeon: Nelva Bush, MD;  Location: [facility];  Service: Cardiovascular;  Laterality: N/A;    Current Medications: Prior  to Admission medications   Medication Sig Start Date End Date Taking? Authorizing Provider  b complex vitamins tablet Take 1 tablet by mouth daily.    [provider]  FLUoxetine (PROZAC) 20 MG tablet Take 20 mg by mouth daily.    [provider]  ketotifen (THERATEARS ALLERGY) 0.025 % ophthalmic solution Place 1-2 drops into both eyes at bedtime.    [provider]  Multiple Minerals-Vitamins (CALCIUM-MAGNESIUM-ZINC-D3) TABS Take 2-3 tablets by mouth daily.    [provider]  nitroGLYCERIN (NITROSTAT) 0.4 MG SL tablet Place 1 tablet (0.4 mg total) under the tongue every 5 (five) minutes as needed for chest pain. 01/14/18 04/14/18  Marlan Steward, Crista Luria, PA  Omega-3 Fatty Acids (FISH OIL) 1200 MG CAPS Take 2 capsules by mouth daily.    [provider]    Allergies:   Lipitor [atorvastatin calcium] and Celebrex [celecoxib]   Social History   Socioeconomic History  . Marital status: Married    Spouse name: Not on file  . Number of children: Not on file  . Years of education: Not on file  . Highest education level: Not on file  Occupational History  . Not on file  Social Needs  . Financial resource strain: Not on file  . Food insecurity:    Worry: Not on file    Inability: Not on file  .  Transportation needs:    Medical: Not on file    Non-medical: Not on file  Tobacco Use  . Smoking status: Never Smoker  . Smokeless tobacco: Never Used  Substance and Sexual Activity  . Alcohol use: Yes    Comment: rare  . Drug use: No  . Sexual activity: Not on file  Lifestyle  . Physical activity:    Days per week: Not on file    Minutes per session: Not on file  . Stress: Not on file  Relationships  . Social connections:    Talks on phone: Not on file    Gets together: Not on file    Attends religious service: Not on file    Active member of club or organization: Not on file    Attends meetings of clubs or organizations: Not on file     Relationship status: Not on file  Other Topics Concern  . Not on file  Social History Narrative  . Not on file     Family History:  The patient's family history includes Diabetes in her father and mother; Hypertension in her mother; Stroke in her maternal uncle.   ROS:   Please see the history of present illness.    ROS All other systems reviewed and are negative.   PHYSICAL EXAM:   VS:  BP (!) 86/58   Pulse 81   Ht 5' 2.5" (1.588 m)   Wt 144 lb 12.8 oz (65.7 kg)   SpO2 97%   BMI 26.06 kg/m    GEN: Well nourished, well developed, in no acute distress  HEENT: normal  Neck: no JVD, carotid bruits, or masses Cardiac: RRR; no murmurs, rubs, or gallops,no edema  Respiratory:  clear to auscultation bilaterally, normal work of breathing GI: soft, nontender, nondistended, + BS MS: no deformity or atrophy  Skin: warm and dry, no rash Neuro:  Alert and Oriented x 3, Strength and sensation are intact Psych: euthymic mood, full affect  Wt Readings from Last 3 Encounters:  02/10/18 144 lb 12.8 oz (65.7 kg)  01/16/18 142 lb (64.4 kg)  01/14/18 144 lb 12.8 oz (65.7 kg)      Studies/Labs Reviewed:   EKG:  EKG is not ordered today.    Recent Labs: 01/14/2018: BUN 14; Creatinine, Ser 0.84; Hemoglobin 10.8; Platelets 407; Potassium 4.2; Sodium 141   Lipid Panel No results found for: CHOL, TRIG, HDL, CHOLHDL, VLDL, LDLCALC, LDLDIRECT  Additional studies/ records that were reviewed today include:   LEFT HEART CATH AND CORONARY ANGIOGRAPHY  01/16/18  Conclusion   Conclusions: 1. No angiographically significant coronary artery disease. 2. Normal left ventricular contraction. 3. Normal left ventricular filling pressure.  Recommendations: 1. Primary prevention of coronary artery disease. 2. Consider evaluation for non-cardiac causes of chest pain and/or medical therapy for microvascular dysfunction if symptoms persist.     ASSESSMENT & PLAN:    1. Chest pain - Seems  related to esophageal spasm. Improved after Prevacid but yesterday had recurrent episode after less than one cup of coffee. Will send referal GI. Previously seen by Dr. Percell Miller.   2. Hyperlipidemia -Statin intolerance.  She had a memory issue on Lipitor and leg cramp on Crestor. However, tolerating Crestor 2.5mg  qd.   3. Dizziness - Not orthostatic by vital or symptoms today. Previously seen by ENT for ear pain. Seem exacerbated by head movement. Has appointment with neurology next week. Encouraged hydration.  Orthostatic VS for the past 24 hrs:  BP- Lying Pulse- Lying BP-  Sitting Pulse- Sitting BP- Standing at 0 minutes Pulse- Standing at 0 minutes  02/10/18 1551 98/64 70 106/70 75 98/66 76      Medication Adjustments/Labs and Tests Ordered: Current medicines are reviewed at length with the patient today.  Concerns regarding medicines are outlined above.  Medication changes, Labs and Tests ordered today are listed in the Patient Instructions below. Patient Instructions  Medication Instructions:  Your physician recommends that you continue on your current medications as directed. Please refer to the Current Medication list given to you today.   Labwork: NONE ORDERED TODAY  Testing/Procedures: NONE ORDERED TODAY  Follow-Up: FOLLOW UP AS NEEDED   Any Other Special Instructions Will Be Listed Below (If Applicable).     If you need a refill on your cardiac medications before your next appointment, please call your pharmacy.      Jarrett Soho, [place]  02/10/2018 4:20 PM    [facility] [address], [place], [place]  [address] Phone: [phone]; Fax: [phone]

## 2018-02-20 ENCOUNTER — Encounter

## 2018-02-20 ENCOUNTER — Ambulatory Visit (INDEPENDENT_AMBULATORY_CARE_PROVIDER_SITE_OTHER): Payer: No Typology Code available for payment source | Admitting: Neurology

## 2018-02-20 ENCOUNTER — Encounter: Payer: Self-pay | Admitting: Neurology

## 2018-02-20 ENCOUNTER — Telehealth: Payer: Self-pay | Admitting: Neurology

## 2018-02-20 VITALS — BP 114/68 | HR 72 | Ht 62.0 in | Wt 145.0 lb

## 2018-02-20 DIAGNOSIS — M79601 Pain in right arm: Secondary | ICD-10-CM

## 2018-02-20 DIAGNOSIS — R251 Tremor, unspecified: Secondary | ICD-10-CM

## 2018-02-20 DIAGNOSIS — G500 Trigeminal neuralgia: Secondary | ICD-10-CM | POA: Diagnosis not present

## 2018-02-20 DIAGNOSIS — R51 Headache: Secondary | ICD-10-CM

## 2018-02-20 DIAGNOSIS — H539 Unspecified visual disturbance: Secondary | ICD-10-CM

## 2018-02-20 DIAGNOSIS — W57XXXA Bitten or stung by nonvenomous insect and other nonvenomous arthropods, initial encounter: Secondary | ICD-10-CM

## 2018-02-20 DIAGNOSIS — R519 Headache, unspecified: Secondary | ICD-10-CM

## 2018-02-20 DIAGNOSIS — R200 Anesthesia of skin: Secondary | ICD-10-CM | POA: Diagnosis not present

## 2018-02-20 NOTE — Patient Instructions (Addendum)
MRI brain w.wo contrast MRI face and neck with trigeminal protocol Labs   Trigeminal Neuralgia Trigeminal neuralgia is a nerve disorder that causes attacks of severe facial pain. The attacks last from a few seconds to several minutes. They can happen for days, weeks, or months and then go away for months or years. Trigeminal neuralgia is also called tic douloureux. What are the causes? This condition is caused by damage to a nerve in the face that is called the trigeminal nerve. An attack can be triggered by:  Talking.  Chewing.  Putting on makeup.  Washing your face.  Shaving your face.  Brushing your teeth.  Touching your face.  What increases the risk? This condition is more likely to develop in:  Women.  People who are 9 years of age or older.  What are the signs or symptoms? The main symptom of this condition is pain in the jaw, lips, eyes, nose, scalp, forehead, and face. The pain may be intense, stabbing, electric, or shock-like. How is this diagnosed? This condition is diagnosed with a physical exam. A CT scan or MRI may be done to rule out other conditions that can cause facial pain. How is this treated? This condition may be treated with:  Avoiding the things that trigger your attacks.  Pain medicine.  Surgery. This may be done in severe cases if other medical treatment does not provide relief.  Follow these instructions at home:  Take over-the-counter and prescription medicines only as told by your health care provider.  If you wish to get pregnant, talk with your health care provider before you start trying to get pregnant.  Avoid the things that trigger your attacks. It may help to: ? Chew on the unaffected side of your mouth. ? Avoid touching your face. ? Avoid blasts of hot or cold air. Contact a health care provider if:  Your pain medicine is not helping.  You develop new, unexplained symptoms, such as: ? Double vision. ? Facial  weakness. ? Changes in hearing or balance.  You become pregnant. Get help right away if:  Your pain is unbearable, and your pain medicine does not help. This information is not intended to replace advice given to you by your health care provider. Make sure you discuss any questions you have with your health care provider. Document Released: 10/04/2000 Document Revised: 06/09/2016 Document Reviewed: 01/30/2015 Elsevier Interactive Patient Education  2018 [place] Headache A cluster headache is a type of headache that causes deep, intense head pain. Cluster headaches can last from 15 minutes to 3 hours. They usually occur:  On one side of the head. They may occur on the other side when a new cluster of headaches begins.  Repeatedly over weeks to months.  Several times a day.  At the same time of day, often at night.  More often in the fall and springtime.  What are the causes? The cause of this condition is not known. What increases the risk? This condition is more likely to develop in:  Males.  People who drink alcohol.  People who smoke or use products that contain nicotine or tobacco.  People who take medicines that cause blood vessels to expand, such as nitroglycerin.  People who take antihistamines.  What are the signs or symptoms? Symptoms of this condition include:  Severe pain on one side of the head that begins behind or around your eye or temple.  Pain on one side of the head.  Nausea.  Sensitivity to light.  Runny nose and nasal stuffiness.  Sweaty, pale skin on the face.  Droopy or swollen eyelid, eye redness, or tearing.  Restlessness and agitation.  How is this diagnosed? This condition may be diagnosed based on:  Your symptoms.  A physical exam.  Your health care provider may order tests to see if your headaches are caused by another medical condition. These tests may show that you do not have cluster headaches. Tests may  include:  A CT scan of your head.  An MRI of your head.  Lab tests.  How is this treated? This condition may be treated with:  Medicines to relieve pain and to prevent repeated (recurrent) attacks. Some people may need a combination of medicines.  Oxygen. This helps to relieve pain.  Follow these instructions at home: Headache diary Keep a headache diary as told by your health care provider. Doing this can help you and your health care provider figure out what triggers your headaches. In your headache diary, include information about:  The time of day that your headache started and what you were doing when it began.  How long your headache lasted.  Where your pain started and whether it moved to other areas.  The type of pain, such as burning, stabbing, throbbing, or cramping.  Your level of pain. Use a pain scale and rate the pain with a number from 1 (mild) up to 10 (severe).  The treatment that you used, and any change in symptoms after treatment.  Medicines  Take over-the-counter and prescription medicines only as told by your health care provider.  Do not drive or use heavy machinery while taking prescription pain medicine.  Use oxygen as told by your health care provider. Lifestyle  Follow a regular sleep schedule. Do not vary the time that you go to bed or the amount that you sleep from day to day. It is important to stay on the same schedule during a cluster period to help prevent headaches.  Exercise regularly.  Eat a healthy diet and avoid foods that may trigger your headaches.  Avoid alcohol.  Do not use any products that contain nicotine or tobacco, such as cigarettes and e-cigarettes. If you need help quitting, ask your health care provider. Contact a health care provider if:  Your headaches change, become more severe, or occur more often.  The medicine or oxygen that your health care provider recommended does not help. Get help right away if:  You  faint.  You have weakness or numbness, especially on one side of your body or face.  You have double vision.  You have nausea or vomiting that does not go away within several hours.  You have trouble talking, walking, or keeping your balance.  You have pain or stiffness in your neck.  You have a fever. Summary  A cluster headache is a type of headache that causes deep, intense head pain, usually on one side of the head.  Keep a headache diary to help discover what triggers your headaches.  A regular sleep schedule can help prevent headaches. This information is not intended to replace advice given to you by your health care provider. Make sure you discuss any questions you have with your health care provider. Document Released: 10/07/2005 Document Revised: 06/18/2016 Document Reviewed: 06/18/2016 Elsevier Interactive Patient Education  [employer].

## 2018-02-20 NOTE — Progress Notes (Signed)
[facility]    Provider:  Dr Jaynee Eagles Referring Provider: Shirline Frees, MD Primary Care Physician:  Shirline Frees, MD  CC:  Random body pains in arms, legs, pelvis, since being bit by a tick as well as stabbing pain behind her left ear  HPI:  Michele [name] is a 52 y.o. female here as a referral from Dr. Kenton Kingfisher for trigeminal neuralgia but she says she is here to discuss multiple  Problems she thinks is related to lyme disease.  Marland Kitchen PMHx HLD, GERD, anxiety, back pain, DDD, she is a nurse at [employer]. Recently evaluated for chest pain also reporting fatigue. She had a tick bite in June. In August she started getting infrequent sharp shooting pain behind her left ear (points behind the tragus) lasts 2-3 seconds, started to become more frequent once a week. Now it is every other day twice a day. She is taking a B complex. Periodically her bottom lip wil quiver, she can feel it but no one can see it. She also has a pain poking in her temple, not as sharp as behind the ear, but sharp, also 3 seconds and then another 3 seconds the whole episode can last 30 minutes - 3 hours, no autonomic symptoms with the stabbing, the 2 pains (behind the ear and in the temple) don;t seem to be associated. Left eye was "twitching" for 45 minutes. She also has "random strange things". A month ago her forearm started hurting severely for an hour and went away. She was lying in bed later that week and her legs ached for 20 minutes then gone. Yesterday her groin hurt for 15 minutes. Odd transient incidents that happen once. Since her root canal she has intermittent belly pain and slight nausea and awful smeeling gas. She has chest discomfort. Not sensitive to sunlight or have rashes with sunlight. 10-12 headache free days. Emgality is helping. She had a lot of side effects to the triptans, would help then rebound.   Reviewed notes, labs and imaging from outside physicians, which showed:  12/26/2016:  CT head showed No acute intracranial abnormalities including mass lesion or mass effect, hydrocephalus, extra-axial fluid collection, midline shift, hemorrhage, or acute infarction, large ischemic events (personally reviewed images)  Bmp unremarkable, CMP with anemia (10.8/33.9) and platelets 407 otherwise normal.   Reviewed referring physician's notes, examination normal, she has anxiety on Prozac, she has back pain, she is been seen by GI for abdominal bloating and lower abdominal pelvic discomfort also seen by cardiology for chest pain and shortness of breath, she had a colonoscopy, she has high cholesterol LDL 161 but she deferred treatment  Review of Systems: Patient complains of symptoms per HPI as well as the following symptoms: sleepiness, cramps, decreased energy. Pertinent negatives and positives per HPI. All others negative.   Social History   Socioeconomic History  . Marital status: Married    Spouse name: Not on file  . Number of children: Not on file  . Years of education: Not on file  . Highest education level: Not on file  Occupational History  . Not on file  Social Needs  . Financial resource strain: Not on file  . Food insecurity:    Worry: Not on file    Inability: Not on file  . Transportation needs:    Medical: Not on file    Non-medical: Not on file  Tobacco Use  . Smoking status: Never Smoker  . Smokeless tobacco: Never Used  Substance and Sexual  Activity  . Alcohol use: Yes    Alcohol/week: 2.4 oz    Types: 4 Glasses of wine per week  . Drug use: No  . Sexual activity: Not on file  Lifestyle  . Physical activity:    Days per week: Not on file    Minutes per session: Not on file  . Stress: Not on file  Relationships  . Social connections:    Talks on phone: Not on file    Gets together: Not on file    Attends religious service: Not on file    Active member of club or organization: Not on file    Attends meetings of clubs or organizations: Not on  file    Relationship status: Not on file  . Intimate partner violence:    Fear of current or ex partner: Not on file    Emotionally abused: Not on file    Physically abused: Not on file    Forced sexual activity: Not on file  Other Topics Concern  . Not on file  Social History Narrative  . Not on file    Family History  Problem Relation Age of Onset  . Hypertension Mother   . Diabetes Mother   . Diabetes Father   . Heart attack Father   . Stroke Maternal Uncle   . Heart attack Sister   . Breast cancer Neg Hx     Past Medical History:  Diagnosis Date  . Abdominal bloating   . Anxiety   . Back pain   . DDD (degenerative disc disease), cervical   . Dorsalgia   . Dorsalgia   . GERD (gastroesophageal reflux disease)   . Hypercholesterolemia   . Hyperlipidemia   . Intestinal gas excretion     Past Surgical History:  Procedure Laterality Date  . BASAL CELL CARCINOMA EXCISION    . BREAST BIOPSY Left    Duct removal (No scar seen)   . BREAST LUMPECTOMY Left 1992  . LEFT HEART CATH AND CORONARY ANGIOGRAPHY N/A 01/16/2018   Procedure: LEFT HEART CATH AND CORONARY ANGIOGRAPHY;  Surgeon: Nelva Bush, MD;  Location: [facility];  Service: Cardiovascular;  Laterality: N/A;    Current Outpatient Medications  Medication Sig Dispense Refill  . b complex vitamins tablet Take 1 tablet by mouth daily.    Marland Kitchen dexlansoprazole (DEXILANT) 60 MG capsule Take 60 mg by mouth daily.    Marland Kitchen FLUoxetine (PROZAC) 20 MG tablet Take 20 mg by mouth daily.    Marland Kitchen ketotifen (THERATEARS ALLERGY) 0.025 % ophthalmic solution Place 1-2 drops into both eyes at bedtime.    . Multiple Minerals-Vitamins (CALCIUM-MAGNESIUM-ZINC-D3) TABS Take 1-2 tablets by mouth daily.     . Omega-3 Fatty Acids (FISH OIL) 1200 MG CAPS Take 2 capsules by mouth daily.     No current facility-administered medications for this visit.     Allergies as of 02/20/2018 - Review Complete 02/20/2018  Allergen Reaction Noted    . Lipitor [atorvastatin calcium]  01/14/2018  . Celebrex [celecoxib] Rash 12/05/2015    Vitals: BP 114/68   Pulse 72   Ht 5\' 2"  (1.575 m)   Wt 145 lb (65.8 kg)   BMI 26.52 kg/m  Last Weight:  Wt Readings from Last 1 Encounters:  02/20/18 145 lb (65.8 kg)   Last Height:   Ht Readings from Last 1 Encounters:  02/20/18 5\' 2"  (1.575 m)   Physical exam: Exam: Gen: NAD, conversant, well nourised, well groomed  CV: RRR, no MRG. No Carotid Bruits. No peripheral edema, warm, nontender Eyes: Conjunctivae clear without exudates or hemorrhage  Neuro: Detailed Neurologic Exam  Speech:    Speech is normal; fluent and spontaneous with normal comprehension.  Cognition:    The patient is oriented to person, place, and time;     recent and remote memory intact;     language fluent;     normal attention, concentration,     fund of knowledge Cranial Nerves:    The pupils are equal, round, and reactive to light. The fundi are normal and spontaneous venous pulsations are present. Visual fields are full to finger confrontation. Extraocular movements are intact. Trigeminal sensation is intact and the muscles of mastication are normal. The face is symmetric. The palate elevates in the midline. Hearing intact. Voice is normal. Shoulder shrug is normal. The tongue has normal motion without fasciculations.   Coordination:    Normal finger to nose and heel to shin. Normal rapid alternating movements.   Gait:    Heel-toe and tandem gait are normal.   Motor Observation:    No asymmetry, no atrophy, and no involuntary movements noted. Tone:    Normal muscle tone.    Posture:    Posture is normal. normal erect    Strength:    Strength is V/V in the upper and lower limbs.      Sensation: intact to LT     Reflex Exam:  DTR's:    Deep tendon reflexes in the upper and lower extremities are normal bilaterally.   Toes:    The toes are downgoing bilaterally.   Clonus:     Clonus is absent.       Assessment/Plan:  Patient with a constellation of neurologic symptoms.  Needs evaluation for MS due to multiple symptoms Also trigeminal neuralgia needs dedicated to see for tumor or vascular loop  Extensive labs today If Lyme positive will order lumbar puncture  Orders Placed This Encounter  Procedures  . MR FACE/TRIGEMINAL WO/W CM  . MR BRAIN W WO CONTRAST  . TSH  . Sedimentation rate  . B12 and Folate Panel  . Tissue transglutaminase, IgA  . Gliadin antibodies, serum  . Heavy metals, blood  . Vitamin B6  . Methylmalonic acid, serum  . Vitamin B1  . ANA, IFA (with reflex)  . C-reactive protein  . Basic Metabolic Panel  . BClaris Che Antibody   Cc: Dr. Maylon Peppers, MD  [facility] [address] [place], [place] [address]  Phone [phone] Fax [phone]

## 2018-02-20 NOTE — Telephone Encounter (Signed)
Patient came into office today to schedule MRI. Patient has been schedule for MRI Brain w/wo and MRI Face/Orbits w/wo. I called focus plan and submitted auth [id number], the nurse is going to fax a letter with date and approval information. Patient has a 150 dollar deductible, no referral needed. I called and made the patient patient aware. Dr. Jaynee Eagles ordered a MRI Brain w/wo and MRI Face Trigeminal, I spoke with her and she said that she wanted the [phone], our sheet reads that this is an MRI orbit face (this is what I got approved). Please make sure this is correct.

## 2018-02-22 DIAGNOSIS — W57XXXA Bitten or stung by nonvenomous insect and other nonvenomous arthropods, initial encounter: Secondary | ICD-10-CM | POA: Insufficient documentation

## 2018-02-22 HISTORY — DX: Bitten or stung by nonvenomous insect and other nonvenomous arthropods, initial encounter: W57.XXXA

## 2018-02-23 LAB — B. BURGDORFI ANTIBODIES: Lyme IgG/IgM Ab: <0.91 {ISR} (ref 0.00–0.90)

## 2018-02-23 NOTE — Telephone Encounter (Signed)
Noted, thank you

## 2018-02-25 ENCOUNTER — Telehealth: Payer: Self-pay

## 2018-02-25 ENCOUNTER — Ambulatory Visit: Payer: No Typology Code available for payment source

## 2018-02-25 DIAGNOSIS — R51 Headache: Secondary | ICD-10-CM

## 2018-02-25 DIAGNOSIS — R251 Tremor, unspecified: Secondary | ICD-10-CM

## 2018-02-25 DIAGNOSIS — R200 Anesthesia of skin: Secondary | ICD-10-CM

## 2018-02-25 DIAGNOSIS — R519 Headache, unspecified: Secondary | ICD-10-CM

## 2018-02-25 DIAGNOSIS — G500 Trigeminal neuralgia: Secondary | ICD-10-CM | POA: Diagnosis not present

## 2018-02-25 DIAGNOSIS — H539 Unspecified visual disturbance: Secondary | ICD-10-CM

## 2018-02-25 DIAGNOSIS — M79601 Pain in right arm: Secondary | ICD-10-CM

## 2018-02-25 LAB — HEAVY METALS, BLOOD
ARSENIC: 8 ug/L (ref 2–23)
Lead, Blood: NOT DETECTED ug/dL (ref 0–4)
MERCURY: 1.5 ug/L (ref 0.0–14.9)

## 2018-02-25 LAB — C-REACTIVE PROTEIN: CRP: 1.5 mg/L (ref 0.0–4.9)

## 2018-02-25 LAB — B12 AND FOLATE PANEL
Folate: >20 ng/mL (ref >3.0)
VITAMIN B 12: 703 pg/mL (ref 232–1245)

## 2018-02-25 LAB — BASIC METABOLIC PANEL
BUN / CREAT RATIO: 14 (ref 9–23)
BUN: 12 mg/dL (ref 6–24)
CO2: 21 mmol/L (ref 20–29)
CREATININE: 0.86 mg/dL (ref 0.57–1.00)
Calcium: 9.2 mg/dL (ref 8.7–10.2)
Chloride: 105 mmol/L (ref 96–106)
GFR calc non Af Amer: 77 mL/min/{1.73_m2} (ref >59)
GFR, EST AFRICAN AMERICAN: 89 mL/min/{1.73_m2} (ref >59)
Glucose: 93 mg/dL (ref 65–99)
Potassium: 4 mmol/L (ref 3.5–5.2)
SODIUM: 138 mmol/L (ref 134–144)

## 2018-02-25 LAB — TISSUE TRANSGLUTAMINASE, IGA

## 2018-02-25 LAB — GLIADIN ANTIBODIES, SERUM
Antigliadin Abs, IgA: 7 units (ref 0–19)
Gliadin IgG: 2 units (ref 0–19)

## 2018-02-25 LAB — TSH: TSH: 0.952 u[IU]/mL (ref 0.450–4.500)

## 2018-02-25 LAB — METHYLMALONIC ACID, SERUM: METHYLMALONIC ACID: 95 nmol/L (ref 0–378)

## 2018-02-25 LAB — VITAMIN B6: Vitamin B6: 21.4 ug/L (ref 2.0–32.8)

## 2018-02-25 LAB — SEDIMENTATION RATE: SED RATE: 2 mm/h (ref 0–40)

## 2018-02-25 LAB — ANTINUCLEAR ANTIBODIES, IFA: ANA Titer 1: NEGATIVE

## 2018-02-25 LAB — VITAMIN B1: THIAMINE: 139.6 nmol/L (ref 66.5–200.0)

## 2018-02-25 MED ORDER — GADOPENTETATE DIMEGLUMINE 469.01 MG/ML IV SOLN
13.0000 mL | Freq: Once | INTRAVENOUS | Status: AC | PRN
  Administered 2018-02-25: 13 mL via INTRAVENOUS

## 2018-02-25 NOTE — Telephone Encounter (Signed)
-----   Message from Melvenia Beam, MD sent at 02/24/2018  5:44 PM EDT ----- Labs normal including negative Lyne disease. Still one lab pending and if that is abnormal we will call her but everything looks fine.

## 2018-02-25 NOTE — Telephone Encounter (Addendum)
Pt returned my call. She asked me to read off all the labs that were tested and the results. I do not see any pending labs at this time. Pt completed her MRI today. I asked pt to give [place] a week to review those results. Pt verbalized understanding of results. Pt had no further questions at this time but was encouraged to call back if questions arise.

## 2018-02-25 NOTE — Telephone Encounter (Signed)
I called pt to discuss her labs. No answer at home or cell. Left a message at both home and cell number asking her to call me back.

## 2018-02-27 NOTE — Telephone Encounter (Signed)
They all resulted then thanks

## 2018-03-01 ENCOUNTER — Telehealth: Payer: Self-pay | Admitting: Neurology

## 2018-03-01 NOTE — Telephone Encounter (Signed)
MRI with trigaminal protocol was completed 02/25/2018 but doesn't appear to have been read. Would one of be able to do this on Monday please? Let me know, thanks

## 2018-03-01 NOTE — Telephone Encounter (Signed)
Spoke with patient, reviewed MRI brain as follows: Unremarkable MRI scan of the brain with and without contrast.  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

## 2018-03-02 NOTE — Telephone Encounter (Signed)
See result note, MRI trigeminal protocol normal, already discussed brain with patient (incidental chiari)

## 2018-03-02 NOTE — Telephone Encounter (Signed)
I just read the study.

## 2018-04-02 MED FILL — FLUoxetine HCL 20 MG CAPS: 20 | 90 days supply | Qty: 90 | Fill #2

## 2018-04-14 ENCOUNTER — Ambulatory Visit: Payer: No Typology Code available for payment source | Admitting: Neurology

## 2018-05-13 NOTE — Telephone Encounter (Signed)
Patient calling to get MRI results. °

## 2018-05-13 NOTE — Telephone Encounter (Signed)
Spoke with Michele [name]. She stated she talked to Dr. Jaynee Eagles about the MRI brain but did not get the MRI face results. Per Dr. Jaynee Eagles, MRI face is normal. Informed Michele [name] of this. Answered Michele [name]'s questions and she verbalized appreciation. RN encouraged Michele [name] to email Dr. Jaynee Eagles if she has any other concerns or questions. Michele [name] verbalized appreciation.

## 2018-07-22 ENCOUNTER — Other Ambulatory Visit: Payer: Self-pay | Admitting: Obstetrics and Gynecology

## 2018-07-22 DIAGNOSIS — Z1231 Encounter for screening mammogram for malignant neoplasm of breast: Secondary | ICD-10-CM

## 2018-07-31 MED FILL — FLUoxetine HCL 20 MG CAPS: 20 | 90 days supply | Qty: 90 | Fill #0

## 2018-09-04 ENCOUNTER — Ambulatory Visit
Admission: RE | Admit: 2018-09-04 | Discharge: 2018-09-04 | Disposition: A | Discharge status: Home / Self Care | Payer: No Typology Code available for payment source | Source: Ambulatory Visit | Attending: Obstetrics and Gynecology | Admitting: Obstetrics and Gynecology

## 2018-09-04 DIAGNOSIS — Z1231 Encounter for screening mammogram for malignant neoplasm of breast: Secondary | ICD-10-CM

## 2019-01-14 ENCOUNTER — Telehealth: Payer: No Typology Code available for payment source | Admitting: Family

## 2019-01-14 DIAGNOSIS — R05 Cough: Secondary | ICD-10-CM

## 2019-01-14 DIAGNOSIS — R059 Cough, unspecified: Secondary | ICD-10-CM

## 2019-01-14 MED ORDER — PROMETHAZINE-DM 6.25-15 MG/5ML PO SYRP: 5.0000 mL | ORAL_SOLUTION | Freq: Four times a day (QID) | ORAL | 0 refills | Status: DC | PRN

## 2019-01-14 NOTE — Progress Notes (Signed)

## 2019-01-28 MED FILL — ZYLET EYE DROPS: 0.5-0.3 | 50 days supply | Qty: 10 | Fill #0

## 2019-01-29 MED FILL — FLUoxetine HCL 20 MG CAPS: 20 | 30 days supply | Qty: 30 | Fill #0

## 2019-03-03 ENCOUNTER — Other Ambulatory Visit: Payer: Self-pay | Admitting: Family Medicine

## 2019-03-03 DIAGNOSIS — R1011 Right upper quadrant pain: Secondary | ICD-10-CM

## 2019-03-25 MED FILL — FLUoxetine HCL 10 MG CAPS: 10 | 90 days supply | Qty: 90 | Fill #0

## 2019-04-07 MED FILL — FLUoxetine HCL 10 MG CAPS: 10 | 90 days supply | Qty: 90 | Fill #0

## 2019-05-24 MED FILL — MINOCYCLINE 100 MG CAPSULE: 100 | 30 days supply | Qty: 60 | Fill #0

## 2019-05-24 MED FILL — MELOXICAM 7.5 MG TABLET: 7.5 | 15 days supply | Qty: 30 | Fill #0

## 2019-06-21 MED FILL — FLUoxetine HCL 20 MG CAPS: 20 | 30 days supply | Qty: 30 | Fill #0

## 2019-06-30 MED FILL — FLUoxetine HCL 10 MG CAPS: 10 | 90 days supply | Qty: 90 | Fill #1

## 2019-07-14 MED FILL — traZODone HCL 50 MG TABS: 50 | 30 days supply | Qty: 30 | Fill #0

## 2019-07-19 MED FILL — hydrOXYzine HCL 25 MG TABS: 25 | 30 days supply | Qty: 30 | Fill #0

## 2019-08-02 MED FILL — ZYLET EYE DROPS: 0.5-0.3 | 25 days supply | Qty: 5 | Fill #0

## 2019-08-04 ENCOUNTER — Other Ambulatory Visit: Payer: Self-pay | Admitting: Obstetrics and Gynecology

## 2019-08-04 DIAGNOSIS — Z1231 Encounter for screening mammogram for malignant neoplasm of breast: Secondary | ICD-10-CM

## 2019-09-21 ENCOUNTER — Ambulatory Visit
Admission: RE | Admit: 2019-09-21 | Discharge: 2019-09-21 | Disposition: A | Discharge status: Home / Self Care | Payer: No Typology Code available for payment source | Source: Ambulatory Visit | Attending: Obstetrics and Gynecology | Admitting: Obstetrics and Gynecology

## 2019-09-21 ENCOUNTER — Other Ambulatory Visit: Payer: Self-pay

## 2019-09-21 DIAGNOSIS — Z1231 Encounter for screening mammogram for malignant neoplasm of breast: Secondary | ICD-10-CM

## 2019-09-29 MED FILL — FLUoxetine HCL 10 MG CAPS: 10 | 90 days supply | Qty: 90 | Fill #0

## 2019-10-19 MED FILL — ROSUVASTATIN CALCIUM 5 MG T: 5 | 84 days supply | Qty: 12 | Fill #0

## 2019-10-28 MED FILL — TOBRAMYCIN-DEXAMETH OPTH SU: 0.3-0.1 | 90 days supply | Qty: 10 | Fill #0

## 2019-11-08 MED FILL — GABAPENTIN 300 MG CAPSULE: 300 | 30 days supply | Qty: 30 | Fill #0

## 2019-11-10 LAB — CBC: RBC: 4.42 (ref 3.87–5.11)

## 2019-11-10 LAB — HEPATIC FUNCTION PANEL
ALT: 10 (ref 7–35)
AST: 16 (ref 13–35)
Alkaline Phosphatase: 46 (ref 25–125)
Bilirubin, Total: 0.5

## 2019-11-10 LAB — BASIC METABOLIC PANEL
BUN: 9 (ref 4–21)
CO2: 29 — AB (ref 13–22)
Chloride: 100 (ref 99–108)
Creatinine: 0.9 (ref 0.5–1.1)
Glucose: 93
Potassium: 4.5 (ref 3.4–5.3)
Sodium: 137 (ref 137–147)

## 2019-11-10 LAB — COMPREHENSIVE METABOLIC PANEL: Calcium: 9.6 (ref 8.7–10.7)

## 2019-11-10 LAB — LIPID PANEL
Cholesterol: 261 — AB (ref 0–200)
HDL: 75 — AB (ref 35–70)
LDL Cholesterol: 171
LDl/HDL Ratio: 3.5
Triglycerides: 90 (ref 40–160)

## 2019-11-10 LAB — CBC AND DIFFERENTIAL
HCT: 40 (ref 36–46)
Hemoglobin: 13.3 (ref 12.0–16.0)
Neutrophils Absolute: 3
Platelets: 321 (ref 150–399)
WBC: 5

## 2019-11-10 LAB — TSH: TSH: 0.83 (ref 0.41–5.90)

## 2019-12-13 MED FILL — FLUoxetine HCL 20 MG CAPS: 20 | 30 days supply | Qty: 30 | Fill #1

## 2020-01-10 ENCOUNTER — Other Ambulatory Visit (HOSPITAL_BASED_OUTPATIENT_CLINIC_OR_DEPARTMENT_OTHER): Payer: Self-pay | Admitting: Family Medicine

## 2020-01-10 MED FILL — GABAPENTIN 300 MG CAPSULE: 300 | 30 days supply | Qty: 90 | Fill #0

## 2020-01-10 MED FILL — predniSONE 10 MG TABS: 10 | 8 days supply | Qty: 20 | Fill #0

## 2020-01-12 ENCOUNTER — Ambulatory Visit (INDEPENDENT_AMBULATORY_CARE_PROVIDER_SITE_OTHER): Payer: No Typology Code available for payment source | Admitting: Nurse Practitioner

## 2020-01-12 ENCOUNTER — Other Ambulatory Visit: Payer: Self-pay

## 2020-01-12 VITALS — BP 102/74 | HR 79 | Temp 96.9°F | Ht 62.0 in | Wt 138.0 lb

## 2020-01-12 DIAGNOSIS — R413 Other amnesia: Secondary | ICD-10-CM

## 2020-01-12 DIAGNOSIS — R519 Headache, unspecified: Secondary | ICD-10-CM

## 2020-01-12 DIAGNOSIS — Z8616 Personal history of COVID-19: Secondary | ICD-10-CM | POA: Diagnosis not present

## 2020-01-12 MED ORDER — BUTALBITAL-APAP-CAFFEINE 50-325-40 MG PO TABS: 1.0000 | ORAL_TABLET | Freq: Four times a day (QID) | ORAL | 0 refills | Status: AC | PRN

## 2020-01-12 MED FILL — BUTALBITAL-APAP-CAFFEINE 50: 50-325-40 | 5 days supply | Qty: 20 | Fill #0

## 2020-01-12 NOTE — Progress Notes (Addendum)
@Patient  ID: Michele [name], female    DOB: 05-Jun-1966, 54 y.o.   MRN: [id number]  Chief Complaint  Patient presents with  . Post COVID    Tested Positive 1/19 and 3/4. Sx: fatugue, headache, dizzy, some ear pressure.    Referring provider: Shirline Frees, MD   54 year old female with history of Lyme disease, and trigeminal neuralgia, degenerative disc disease,GERD, hyperlipidemia, Covid diagnosed Jan 2021 and March 2021.    HPI Patient presents today for post Covid care visit.  She states that she tested positive for Covid in January 2021 and recovered.  She became ill again at the beginning of March and re-tested positive.  At that time infectious disease was consulted and they suspected possible reinfection with a Covid variant.  Patient states that since the beginning of March she has been having severe headaches, memory loss, poor concentration and severe fatigue.  She has been trying to work but has needed to cut back on her work hours.  Patient states that her doctor has tried Ultram and prednisone and Neurontin with her for headaches.  She does have a history of drug history of trigeminal neuralgia.  She states that none of this is working for her.  She does have decreased appetite but is trying to stay hydrated. Denies f/c/s, n/v/d, hemoptysis, PND, leg swelling, Denies chest pain.  Denies any respiratory symptoms.    Allergies  Allergen Reactions  . Lipitor [Atorvastatin Calcium]     Memory issue  . Celebrex [Celecoxib] Rash    Immunization History  Administered Date(s) Administered  . Influenza Split 08/07/2015    Past Medical History:  Diagnosis Date  . Abdominal bloating   . Anxiety   . Back pain   . DDD (degenerative disc disease), cervical   . Dorsalgia   . Dorsalgia   . GERD (gastroesophageal reflux disease)   . Hypercholesterolemia   . Hyperlipidemia   . Intestinal gas excretion     Tobacco History: Social History   Tobacco Use  Smoking Status  Never Smoker  Smokeless Tobacco Never Used   Counseling given: Yes   Outpatient Encounter Medications as of 01/12/2020  Medication Sig  . FLUoxetine (PROZAC) 20 MG tablet Take 20 mg by mouth daily.  Marland Kitchen ketotifen (THERATEARS ALLERGY) 0.025 % ophthalmic solution Place 1-2 drops into both eyes at bedtime.  Marland Kitchen b complex vitamins tablet Take 1 tablet by mouth daily.  . butalbital-acetaminophen-caffeine (FIORICET) 50-325-40 MG tablet Take 1 tablet by mouth every 6 (six) hours as needed for headache.  . dexlansoprazole (DEXILANT) 60 MG capsule Take 60 mg by mouth daily.  Marland Kitchen gabapentin (NEURONTIN) 300 MG capsule Take 300 mg by mouth 3 (three) times daily.  . Multiple Minerals-Vitamins (CALCIUM-MAGNESIUM-ZINC-D3) TABS Take 1-2 tablets by mouth daily.   . Omega-3 Fatty Acids (FISH OIL) 1200 MG CAPS Take 2 capsules by mouth daily.  . predniSONE (DELTASONE) 10 MG tablet TAKE 4 TABLETS BY MOUTH DAILY FOR 2 DAYS THEN 3 TABLETS FOR 2 DAYS THEN 2 TABS FOR 2 DAYS THEN 1 TAB FOR 2 DAYS  . promethazine-dextromethorphan (PROMETHAZINE-DM) 6.25-15 MG/5ML syrup Take 5 mLs by mouth 4 (four) times daily as needed.  . traMADol (ULTRAM) 50 MG tablet Take 50 mg by mouth every 6 (six) hours as needed.   No facility-administered encounter medications on file as of 01/12/2020.     Review of Systems  Review of Systems  Constitutional: Positive for activity change (decreased) and fatigue.  HENT: Negative.  Respiratory: Positive for cough and shortness of breath.   Cardiovascular: Negative.  Negative for chest pain, palpitations and leg swelling.  Gastrointestinal: Negative.   Allergic/Immunologic: Negative.   Neurological: Positive for headaches.  Psychiatric/Behavioral: Positive for decreased concentration.       Memory loss       Physical Exam  BP 102/74 (BP Location: Left Arm, Patient Position: Sitting, Cuff Size: Small)   Pulse 79   Temp (!) 96.9 F (36.1 C)   Ht 5\' 2"  (1.575 m)   Wt 138 lb (62.6 kg)    LMP 12/26/2019   SpO2 100%   BMI 25.24 kg/m   Wt Readings from Last 5 Encounters:  01/12/20 138 lb (62.6 kg)  02/20/18 145 lb (65.8 kg)  02/10/18 144 lb 12.8 oz (65.7 kg)  01/16/18 142 lb (64.4 kg)  01/14/18 144 lb 12.8 oz (65.7 kg)     Physical Exam Vitals and nursing note reviewed.  Constitutional:      General: She is not in acute distress.    Appearance: She is well-developed.  Cardiovascular:     Rate and Rhythm: Normal rate and regular rhythm.  Pulmonary:     Effort: Pulmonary effort is normal. No respiratory distress.     Breath sounds: Normal breath sounds. No wheezing or rhonchi.  Musculoskeletal:     Right lower leg: No edema.     Left lower leg: No edema.  Neurological:     Mental Status: She is alert and oriented to person, place, and time.  Psychiatric:        Mood and Affect: Mood normal.        Behavior: Behavior normal.     Comments: Slow to answer questions in office today       Assessment & Plan:   History of COVID-19 Fatigue Headache: Patient continues to have severe headaches and fatigue. She has tried gabapentin, ultram, and prednisone with no relief. She continues to work even with extreme fatigue, but has had to cut back work hours.   Plan: May stay out of work for the rest of the week to rest.  Will trial Fioricet  Drink plenty of fluids  Get plenty of rest  If headache becomes more severe please go to the ED    Memory loss:  Hopefully memory will improve with rest, headache relief, and recovery time.  Plan: If memory does not improve - may need referral to neurology.    Patient Instructions  Will trial Fioricet  Drink plenty of fluids  Get plenty of rest  If headache becomes more severe please go to the ED  Follow up in 1 week or sooner if needed      Person Under Monitoring Name: Michele [name]  Location: [place] [address]   Infection Prevention Recommendations for Individuals  Confirmed to have, or Being Evaluated for, 2019 Novel Coronavirus (COVID-19) Infection Who Receive Care at Home  Individuals who are confirmed to have, or are being evaluated for, COVID-19 should follow the prevention steps below until a healthcare provider or local or state health department says they can return to normal activities.  Stay home except to get medical care You should restrict activities outside your home, except for getting medical care. Do not go to work, school, or public areas, and do not use public transportation or taxis.  Call ahead before visiting your doctor Before your medical appointment, call the healthcare provider and tell them that you have, or  are being evaluated for, COVID-19 infection. This will help the healthcare provider's office take steps to keep other people from getting infected. Ask your healthcare provider to call the local or state health department.  Monitor your symptoms Seek prompt medical attention if your illness is worsening (e.g., difficulty breathing). Before going to your medical appointment, call the healthcare provider and tell them that you have, or are being evaluated for, COVID-19 infection. Ask your healthcare provider to call the local or state health department.  Wear a facemask You should wear a facemask that covers your nose and mouth when you are in the same room with other people and when you visit a healthcare provider. People who live with or visit you should also wear a facemask while they are in the same room with you.  Separate yourself from other people in your home As much as possible, you should stay in a different room from other people in your home. Also, you should use a separate bathroom, if available.  Avoid sharing household items You should not share dishes, drinking glasses, cups, eating utensils, towels, bedding, or other items with other people in your home. After using these items, you should wash them  thoroughly with soap and water.  Cover your coughs and sneezes Cover your mouth and nose with a tissue when you cough or sneeze, or you can cough or sneeze into your sleeve. Throw used tissues in a lined trash can, and immediately wash your hands with soap and water for at least 20 seconds or use an alcohol-based hand rub.  Wash your [facility] your hands often and thoroughly with soap and water for at least 20 seconds. You can use an alcohol-based hand sanitizer if soap and water are not available and if your hands are not visibly dirty. Avoid touching your eyes, nose, and mouth with unwashed hands.   Prevention Steps for Caregivers and Household Members of Individuals Confirmed to have, or Being Evaluated for, COVID-19 Infection Being Cared for in the Home  If you live with, or provide care at home for, a person confirmed to have, or being evaluated for, COVID-19 infection please follow these guidelines to prevent infection:  Follow healthcare provider's instructions Make sure that you understand and can help the patient follow any healthcare provider instructions for all care.  Provide for the patient's basic needs You should help the patient with basic needs in the home and provide support for getting groceries, prescriptions, and other personal needs.  Monitor the patient's symptoms If they are getting sicker, call his or her medical provider and tell them that the patient has, or is being evaluated for, COVID-19 infection. This will help the healthcare provider's office take steps to keep other people from getting infected. Ask the healthcare provider to call the local or state health department.  Limit the number of people who have contact with the patient  If possible, have only one caregiver for the patient.  Other household members should stay in another home or place of residence. If this is not possible, they should stay  in another room, or be separated from the  patient as much as possible. Use a separate bathroom, if available.  Restrict visitors who do not have an essential need to be in the home.  Keep older adults, very young children, and other sick people away from the patient Keep older adults, very young children, and those who have compromised immune systems or chronic health conditions away from the patient.  This includes people with chronic heart, lung, or kidney conditions, diabetes, and cancer.  Ensure good ventilation Make sure that shared spaces in the home have good air flow, such as from an air conditioner or an opened window, weather permitting.  Wash your hands often  Wash your hands often and thoroughly with soap and water for at least 20 seconds. You can use an alcohol based hand sanitizer if soap and water are not available and if your hands are not visibly dirty.  Avoid touching your eyes, nose, and mouth with unwashed hands.  Use disposable paper towels to dry your hands. If not available, use dedicated cloth towels and replace them when they become wet.  Wear a facemask and gloves  Wear a disposable facemask at all times in the room and gloves when you touch or have contact with the patient's blood, body fluids, and/or secretions or excretions, such as sweat, saliva, sputum, nasal mucus, vomit, urine, or feces.  Ensure the mask fits over your nose and mouth tightly, and do not touch it during use.  Throw out disposable facemasks and gloves after using them. Do not reuse.  Wash your hands immediately after removing your facemask and gloves.  If your personal clothing becomes contaminated, carefully remove clothing and launder. Wash your hands after handling contaminated clothing.  Place all used disposable facemasks, gloves, and other waste in a lined container before disposing them with other household waste.  Remove gloves and wash your hands immediately after handling these items.  Do not share dishes, glasses, or  other household items with the patient  Avoid sharing household items. You should not share dishes, drinking glasses, cups, eating utensils, towels, bedding, or other items with a patient who is confirmed to have, or being evaluated for, COVID-19 infection.  After the person uses these items, you should wash them thoroughly with soap and water.  Wash laundry thoroughly  Immediately remove and wash clothes or bedding that have blood, body fluids, and/or secretions or excretions, such as sweat, saliva, sputum, nasal mucus, vomit, urine, or feces, on them.  Wear gloves when handling laundry from the patient.  Read and follow directions on labels of laundry or clothing items and detergent. In general, wash and dry with the warmest temperatures recommended on the label.  Clean all areas the individual has used often  Clean all touchable surfaces, such as counters, tabletops, doorknobs, bathroom fixtures, toilets, phones, keyboards, tablets, and bedside tables, every day. Also, clean any surfaces that may have blood, body fluids, and/or secretions or excretions on them.  Wear gloves when cleaning surfaces the patient has come in contact with.  Use a diluted bleach solution (e.g., dilute bleach with 1 part bleach and 10 parts water) or a household disinfectant with a label that says EPA-registered for coronaviruses. To make a bleach solution at home, add 1 tablespoon of bleach to 1 quart (4 cups) of water. For a larger supply, add  cup of bleach to 1 gallon (16 cups) of water.  Read labels of cleaning products and follow recommendations provided on product labels. Labels contain instructions for safe and effective use of the cleaning product including precautions you should take when applying the product, such as wearing gloves or eye protection and making sure you have good ventilation during use of the product.  Remove gloves and wash hands immediately after cleaning.  Monitor yourself for  signs and symptoms of illness Caregivers and household members are considered close contacts, should monitor their health,  and will be asked to limit movement outside of the home to the extent possible. Follow the monitoring steps for close contacts listed on the symptom monitoring form.   ? If you have additional questions, contact your local health department or call the epidemiologist on call at [phone] (available 24/7). ? This guidance is subject to change. For the most up-to-date guidance from CDC, please refer to their website: YouBlogs.pl   Tension Headache, Adult A tension headache is pain, pressure, or aching in your head. Tension headaches can last from 30 minutes to several days. Follow these instructions at home: Managing pain  Take over-the-counter and prescription medicines only as told by your doctor.  When you have a headache, lie down in a dark, quiet room.  If told, put ice on your head and neck: ? Put ice in a plastic bag. ? Place a towel between your skin and the bag. ? Leave the ice on for 20 minutes, 2-3 times a day.  If told, put heat on the back of your neck. Do this as often as your doctor tells you to. Use the kind of heat that your doctor recommends, such as a moist heat pack or a heating pad. ? Place a towel between your skin and the heat. ? Leave the heat on for 20-30 minutes. ? Remove the heat if your skin turns bright red. Eating and drinking  Eat meals on a regular schedule.  Watch how much alcohol you drink: ? If you are a woman and are not pregnant, do not drink more than 1 drink a day. ? If you are a man, do not drink more than 2 drinks a day.  Drink enough fluid to keep your pee (urine) pale yellow.  Do not use a lot of caffeine, or stop using caffeine. Lifestyle  Get enough sleep. Get 7-9 hours of sleep each night. Or get the amount of sleep that your doctor tells you  to.  At bedtime, remove all electronic devices from your room. Examples of electronic devices are computers, phones, and tablets.  Find ways to lessen your stress. Some things that can lessen stress are: ? Exercise. ? Deep breathing. ? Yoga. ? Music. ? Positive thoughts.  Sit up straight. Do not tighten (tense) your muscles.  Do not use any products that have nicotine or tobacco in them, such as cigarettes and e-cigarettes. If you need help quitting, ask your doctor. General instructions   Keep all follow-up visits as told by your doctor. This is important.  Avoid things that can bring on headaches. Keep a journal to find out if certain things bring on headaches. For example, write down: ? What you eat and drink. ? How much sleep you get. ? Any change to your diet or medicines. Contact a doctor if:  Your headache does not get better.  Your headache comes back.  You have a headache and sounds, light, or smells bother you.  You feel sick to your stomach (nauseous) or you throw up (vomit).  Your stomach hurts. Get help right away if:  You suddenly get a very bad headache along with any of these: ? A stiff neck. ? Feeling sick to your stomach. ? Throwing up. ? Feeling weak. ? Trouble seeing. ? Feeling short of breath. ? A rash. ? Feeling unusually sleepy. ? Trouble speaking. ? Pain in your eye or ear. ? Trouble walking or balancing. ? Feeling like you will pass out (faint). ? Passing out. Summary  A tension  headache is pain, pressure, or aching in your head.  Tension headaches can last from 30 minutes to several days.  Lifestyle changes and medicines may help relieve pain. This information is not intended to replace advice given to you by your health care provider. Make sure you discuss any questions you have with your health care provider. Document Revised: 08/04/2019 Document Reviewed: 01/17/2017 Elsevier Patient Education  2020 [place], [place] 01/13/2020

## 2020-01-12 NOTE — Patient Instructions (Signed)
Will trial Fioricet  Drink plenty of fluids  Get plenty of rest  If headache becomes more severe please go to the ED  Follow up in 1 week or sooner if needed      Person Under Monitoring Name: Michele [name]  Location: [place] [address]   Infection Prevention Recommendations for Individuals Confirmed to have, or Being Evaluated for, 2019 Novel Coronavirus (COVID-19) Infection Who Receive Care at Home  Individuals who are confirmed to have, or are being evaluated for, COVID-19 should follow the prevention steps below until a healthcare provider or local or state health department says they can return to normal activities.  Stay home except to get medical care You should restrict activities outside your home, except for getting medical care. Do not go to work, school, or public areas, and do not use public transportation or taxis.  Call ahead before visiting your doctor Before your medical appointment, call the healthcare provider and tell them that you have, or are being evaluated for, COVID-19 infection. This will help the healthcare provider's office take steps to keep other people from getting infected. Ask your healthcare provider to call the local or state health department.  Monitor your symptoms Seek prompt medical attention if your illness is worsening (e.g., difficulty breathing). Before going to your medical appointment, call the healthcare provider and tell them that you have, or are being evaluated for, COVID-19 infection. Ask your healthcare provider to call the local or state health department.  Wear a facemask You should wear a facemask that covers your nose and mouth when you are in the same room with other people and when you visit a healthcare provider. People who live with or visit you should also wear a facemask while they are in the same room with you.  Separate yourself from other people in your home As much as possible,  you should stay in a different room from other people in your home. Also, you should use a separate bathroom, if available.  Avoid sharing household items You should not share dishes, drinking glasses, cups, eating utensils, towels, bedding, or other items with other people in your home. After using these items, you should wash them thoroughly with soap and water.  Cover your coughs and sneezes Cover your mouth and nose with a tissue when you cough or sneeze, or you can cough or sneeze into your sleeve. Throw used tissues in a lined trash can, and immediately wash your hands with soap and water for at least 20 seconds or use an alcohol-based hand rub.  Wash your [facility] your hands often and thoroughly with soap and water for at least 20 seconds. You can use an alcohol-based hand sanitizer if soap and water are not available and if your hands are not visibly dirty. Avoid touching your eyes, nose, and mouth with unwashed hands.   Prevention Steps for Caregivers and Household Members of Individuals Confirmed to have, or Being Evaluated for, COVID-19 Infection Being Cared for in the Home  If you live with, or provide care at home for, a person confirmed to have, or being evaluated for, COVID-19 infection please follow these guidelines to prevent infection:  Follow healthcare provider's instructions Make sure that you understand and can help the patient follow any healthcare provider instructions for all care.  Provide for the patient's basic needs You should help the patient with basic needs in the home and provide support for getting groceries, prescriptions, and other personal  needs.  Monitor the patient's symptoms If they are getting sicker, call his or her medical provider and tell them that the patient has, or is being evaluated for, COVID-19 infection. This will help the healthcare provider's office take steps to keep other people from getting infected. Ask the healthcare  provider to call the local or state health department.  Limit the number of people who have contact with the patient  If possible, have only one caregiver for the patient.  Other household members should stay in another home or place of residence. If this is not possible, they should stay  in another room, or be separated from the patient as much as possible. Use a separate bathroom, if available.  Restrict visitors who do not have an essential need to be in the home.  Keep older adults, very young children, and other sick people away from the patient Keep older adults, very young children, and those who have compromised immune systems or chronic health conditions away from the patient. This includes people with chronic heart, lung, or kidney conditions, diabetes, and cancer.  Ensure good ventilation Make sure that shared spaces in the home have good air flow, such as from an air conditioner or an opened window, weather permitting.  Wash your hands often  Wash your hands often and thoroughly with soap and water for at least 20 seconds. You can use an alcohol based hand sanitizer if soap and water are not available and if your hands are not visibly dirty.  Avoid touching your eyes, nose, and mouth with unwashed hands.  Use disposable paper towels to dry your hands. If not available, use dedicated cloth towels and replace them when they become wet.  Wear a facemask and gloves  Wear a disposable facemask at all times in the room and gloves when you touch or have contact with the patient's blood, body fluids, and/or secretions or excretions, such as sweat, saliva, sputum, nasal mucus, vomit, urine, or feces.  Ensure the mask fits over your nose and mouth tightly, and do not touch it during use.  Throw out disposable facemasks and gloves after using them. Do not reuse.  Wash your hands immediately after removing your facemask and gloves.  If your personal clothing becomes contaminated,  carefully remove clothing and launder. Wash your hands after handling contaminated clothing.  Place all used disposable facemasks, gloves, and other waste in a lined container before disposing them with other household waste.  Remove gloves and wash your hands immediately after handling these items.  Do not share dishes, glasses, or other household items with the patient  Avoid sharing household items. You should not share dishes, drinking glasses, cups, eating utensils, towels, bedding, or other items with a patient who is confirmed to have, or being evaluated for, COVID-19 infection.  After the person uses these items, you should wash them thoroughly with soap and water.  Wash laundry thoroughly  Immediately remove and wash clothes or bedding that have blood, body fluids, and/or secretions or excretions, such as sweat, saliva, sputum, nasal mucus, vomit, urine, or feces, on them.  Wear gloves when handling laundry from the patient.  Read and follow directions on labels of laundry or clothing items and detergent. In general, wash and dry with the warmest temperatures recommended on the label.  Clean all areas the individual has used often  Clean all touchable surfaces, such as counters, tabletops, doorknobs, bathroom fixtures, toilets, phones, keyboards, tablets, and bedside tables, every day. Also, clean any  surfaces that may have blood, body fluids, and/or secretions or excretions on them.  Wear gloves when cleaning surfaces the patient has come in contact with.  Use a diluted bleach solution (e.g., dilute bleach with 1 part bleach and 10 parts water) or a household disinfectant with a label that says EPA-registered for coronaviruses. To make a bleach solution at home, add 1 tablespoon of bleach to 1 quart (4 cups) of water. For a larger supply, add  cup of bleach to 1 gallon (16 cups) of water.  Read labels of cleaning products and follow recommendations provided on product labels.  Labels contain instructions for safe and effective use of the cleaning product including precautions you should take when applying the product, such as wearing gloves or eye protection and making sure you have good ventilation during use of the product.  Remove gloves and wash hands immediately after cleaning.  Monitor yourself for signs and symptoms of illness Caregivers and household members are considered close contacts, should monitor their health, and will be asked to limit movement outside of the home to the extent possible. Follow the monitoring steps for close contacts listed on the symptom monitoring form.   ? If you have additional questions, contact your local health department or call the epidemiologist on call at [phone] (available 24/7). ? This guidance is subject to change. For the most up-to-date guidance from CDC, please refer to their website: YouBlogs.pl   Tension Headache, Adult A tension headache is pain, pressure, or aching in your head. Tension headaches can last from 30 minutes to several days. Follow these instructions at home: Managing pain  Take over-the-counter and prescription medicines only as told by your doctor.  When you have a headache, lie down in a dark, quiet room.  If told, put ice on your head and neck: ? Put ice in a plastic bag. ? Place a towel between your skin and the bag. ? Leave the ice on for 20 minutes, 2-3 times a day.  If told, put heat on the back of your neck. Do this as often as your doctor tells you to. Use the kind of heat that your doctor recommends, such as a moist heat pack or a heating pad. ? Place a towel between your skin and the heat. ? Leave the heat on for 20-30 minutes. ? Remove the heat if your skin turns bright red. Eating and drinking  Eat meals on a regular schedule.  Watch how much alcohol you drink: ? If you are a woman and are not pregnant,  do not drink more than 1 drink a day. ? If you are a man, do not drink more than 2 drinks a day.  Drink enough fluid to keep your pee (urine) pale yellow.  Do not use a lot of caffeine, or stop using caffeine. Lifestyle  Get enough sleep. Get 7-9 hours of sleep each night. Or get the amount of sleep that your doctor tells you to.  At bedtime, remove all electronic devices from your room. Examples of electronic devices are computers, phones, and tablets.  Find ways to lessen your stress. Some things that can lessen stress are: ? Exercise. ? Deep breathing. ? Yoga. ? Music. ? Positive thoughts.  Sit up straight. Do not tighten (tense) your muscles.  Do not use any products that have nicotine or tobacco in them, such as cigarettes and e-cigarettes. If you need help quitting, ask your doctor. General instructions   Keep all follow-up visits as told  by your doctor. This is important.  Avoid things that can bring on headaches. Keep a journal to find out if certain things bring on headaches. For example, write down: ? What you eat and drink. ? How much sleep you get. ? Any change to your diet or medicines. Contact a doctor if:  Your headache does not get better.  Your headache comes back.  You have a headache and sounds, light, or smells bother you.  You feel sick to your stomach (nauseous) or you throw up (vomit).  Your stomach hurts. Get help right away if:  You suddenly get a very bad headache along with any of these: ? A stiff neck. ? Feeling sick to your stomach. ? Throwing up. ? Feeling weak. ? Trouble seeing. ? Feeling short of breath. ? A rash. ? Feeling unusually sleepy. ? Trouble speaking. ? Pain in your eye or ear. ? Trouble walking or balancing. ? Feeling like you will pass out (faint). ? Passing out. Summary  A tension headache is pain, pressure, or aching in your head.  Tension headaches can last from 30 minutes to several days.  Lifestyle  changes and medicines may help relieve pain. This information is not intended to replace advice given to you by your health care provider. Make sure you discuss any questions you have with your health care provider. Document Revised: 08/04/2019 Document Reviewed: 01/17/2017 Elsevier Patient Education  [place].

## 2020-01-13 ENCOUNTER — Encounter: Payer: Self-pay | Admitting: Nurse Practitioner

## 2020-01-13 DIAGNOSIS — Z8616 Personal history of COVID-19: Secondary | ICD-10-CM | POA: Insufficient documentation

## 2020-01-13 DIAGNOSIS — R519 Headache, unspecified: Secondary | ICD-10-CM | POA: Insufficient documentation

## 2020-01-13 DIAGNOSIS — R413 Other amnesia: Secondary | ICD-10-CM | POA: Insufficient documentation

## 2020-01-13 HISTORY — DX: Other amnesia: R41.3

## 2020-01-13 NOTE — Assessment & Plan Note (Signed)
Fatigue Headache: Patient continues to have severe headaches and fatigue. She has tried gabapentin, ultram, and prednisone with no relief. She continues to work even with extreme fatigue, but has had to cut back work hours.   Plan: May stay out of work for the rest of the week to rest.  Will trial Fioricet  Drink plenty of fluids  Get plenty of rest  If headache becomes more severe please go to the ED    Memory loss:  Hopefully memory will improve with rest, headache relief, and recovery time.  Plan: If memory does not improve - may need referral to neurology.

## 2020-01-19 ENCOUNTER — Ambulatory Visit: Payer: No Typology Code available for payment source

## 2020-01-19 ENCOUNTER — Encounter: Payer: Self-pay | Admitting: Nurse Practitioner

## 2020-01-19 ENCOUNTER — Ambulatory Visit (INDEPENDENT_AMBULATORY_CARE_PROVIDER_SITE_OTHER): Payer: No Typology Code available for payment source | Admitting: Nurse Practitioner

## 2020-01-19 DIAGNOSIS — Z8616 Personal history of COVID-19: Secondary | ICD-10-CM | POA: Diagnosis not present

## 2020-01-19 NOTE — Patient Instructions (Addendum)
Drink plenty of fluids  Get plenty of rest  If headache becomes more severe please go to the ED   Trigeminal Neuralgia  Trigeminal neuralgia is a nerve disorder that causes severe pain on one side of the face. The pain may last from a few seconds to several minutes. The pain is usually only on one side of the face. Symptoms may occur for days, weeks, or months and then go away for months or years. The pain may return and be worse than before. What are the causes? This condition is caused by damage or pressure to a nerve in the head that is called the trigeminal nerve. An attack can be triggered by:  Talking.  Chewing.  Putting on makeup.  Washing your face.  Shaving your face.  Brushing your teeth.  Touching your face. What increases the risk? You are more likely to develop this condition if you:  Are 15 years of age or older.  Are female. What are the signs or symptoms? The main symptom of this condition is severe pain in the:  Jaw.  Lips.  Eyes.  Nose.  Scalp.  Forehead.  Face. The pain may be:  Intense.  Stabbing.  Electric.  Shock-like. How is this diagnosed? This condition is diagnosed with a physical exam. A CT scan or an MRI may be done to rule out other conditions that can cause facial pain. How is this treated? This condition may be treated with:  Avoiding the things that trigger your symptoms.  Taking prescription medicines (anticonvulsants).  Having surgery. This may be done in severe cases if other medical treatment does not provide relief.  Having procedures such as ablation, thermal, or radiation therapy. It may take up to one month for treatment to start relieving the pain. Follow these instructions at home: Managing pain  Learn as much as you can about how to manage your pain. Ask your health care provider if a pain specialist would be helpful.  Consider talking with a mental health care provider (psychologist) about  how to cope with the pain.  Consider joining a pain support group. General instructions  Take over-the-counter and prescription medicines only as told by your health care provider.  Avoid the things that trigger your symptoms. It may help to: ? Chew on the unaffected side of your mouth. ? Avoid touching your face. ? Avoid blasts of hot or cold air.  Follow your treatment plan as told by your health care provider. This may include: ? Cognitive or behavioral therapy. ? Gentle, regular exercise. ? Meditation or yoga. ? Aromatherapy.  Keep all follow-up visits as told by your health care provider. You may need to be monitored closely to make sure treatment is working well for you. Where to find more information  Facial Pain Association: fpa-support.org Contact a health care provider if:  Your medicine is not helping your symptoms.  You have side effects from the medicine used for treatment.  You develop new, unexplained symptoms, such as: ? Double vision. ? Facial weakness. ? Facial numbness. ? Changes in hearing or balance.  You feel depressed. Get help right away if:  Your pain is severe and is not getting better.  You develop suicidal thoughts. If you ever feel like you may hurt yourself or others, or have thoughts about taking your own life, get help right away. You can go to your nearest emergency department or call:  Your local emergency services (911 in the [place]  A suicide crisis helpline,  such as the [facility] at 717-771-5182. This is open 24 hours a day. Summary  Trigeminal neuralgia is a nerve disorder that causes severe pain on one side of the face. The pain may last from a few seconds to several minutes.  This condition is caused by damage or pressure to a nerve in the head that is called the trigeminal nerve.  Treatment may include avoiding the things that trigger your symptoms, taking medicines, or having surgery or  procedures. It may take up to one month for treatment to start relieving the pain.  Avoid the things that trigger your symptoms.  Keep all follow-up visits as told by your health care provider. You may need to be monitored closely to make sure treatment is working well for you. This information is not intended to replace advice given to you by your health care provider. Make sure you discuss any questions you have with your health care provider. Document Revised: 08/24/2018 Document Reviewed: 08/24/2018 Elsevier Patient Education  [place].

## 2020-01-19 NOTE — Assessment & Plan Note (Signed)
History of COVID-19 Fatigue Headache/trigemnial neuralgia: Patient continues to have severe headaches and fatigue. She has tried gabapentin, ultram, and prednisone with no relief. She continues to work even with extreme fatigue, but has had to cut back work hours. She has been to neurology for trigeminal neuralgia pain before she had covid and none of the treatments provided any relief. She is slowly improving. Her headaches are not as severe now. Fatigue is slowly improving as well.   Plan: Continue to work reduced hours.  Drink plenty of fluids  Get plenty of rest  If headache becomes more severe please go to the ED    Memory loss:  Hopefully memory will improve with rest, headache relief, and recovery time.  Plan: If memory does not improve - may need referral to neurology.

## 2020-01-19 NOTE — Progress Notes (Signed)
@Patient  ID: Michele [name], female    DOB: [birth year], 54 y.o.   MRN: [id number]  Chief Complaint  Patient presents with  . Follow-up    Patient continues to complain of headache, brain fog and fatigue. Per patient it is not getting worse but is still consistant    Referring provider: Shirline Frees, MD   54 year old female with history of Lyme disease, and trigeminal neuralgia, degenerative disc disease,GERD, hyperlipidemia, Covid diagnosed Jan 2021 and March 2021.   HPI Patient presents today for follow-up visit.  Patient states that since the beginning of March she has been having severe headaches, memory loss, poor concentration and severe fatigue.  She has been trying to work but has needed to cut back on her work hours.  Patient states that her doctor has tried Ultram and prednisone and Neurontin with her for headaches.  She does have a history of history of trigeminal neuralgia.  She states that none of this is working for her.  At last visit we tried Fioricet.  Patient states that this did not help so she stopped it.  Patient states that her head is hurting in the same area as her trigeminal neuralgia has hurt before.  She has been to a neurologist for this before Covid and states that she has tried every treatment and no treatment has relieved her pain.  She states that this is just been worse that she has been diagnosed with Covid.  She is also taking supplements such as nerve renew and magnesium. She does have decreased appetite but is trying to stay hydrated.  Patient does state that her fatigue is still severe but he she has seen improvement with this over the past week.  She states that her headaches are somewhat better over the past week. Denies f/c/s, n/v/d, hemoptysis, PND, leg swelling, Denies chest pain.  Denies any respiratory symptoms.    Allergies  Allergen Reactions  . Lipitor [Atorvastatin Calcium]     Memory issue  . Celebrex [Celecoxib] Rash    Immunization  History  Administered Date(s) Administered  . Influenza Split 08/07/2015    Past Medical History:  Diagnosis Date  . Abdominal bloating   . Anxiety   . Back pain   . DDD (degenerative disc disease), cervical   . Dorsalgia   . Dorsalgia   . GERD (gastroesophageal reflux disease)   . Hypercholesterolemia   . Hyperlipidemia   . Intestinal gas excretion     Tobacco History: Social History   Tobacco Use  Smoking Status Never Smoker  Smokeless Tobacco Never Used   Counseling given: Not Answered   Outpatient Encounter Medications as of 01/19/2020  Medication Sig  . b complex vitamins tablet Take 1 tablet by mouth daily.  Marland Kitchen dexlansoprazole (DEXILANT) 60 MG capsule Take 60 mg by mouth daily.  Marland Kitchen FLUoxetine (PROZAC) 20 MG tablet Take 20 mg by mouth daily.  Marland Kitchen gabapentin (NEURONTIN) 300 MG capsule Take 300 mg by mouth 3 (three) times daily.  Marland Kitchen ketotifen (THERATEARS ALLERGY) 0.025 % ophthalmic solution Place 1-2 drops into both eyes at bedtime.  . Multiple Minerals-Vitamins (CALCIUM-MAGNESIUM-ZINC-D3) TABS Take 1-2 tablets by mouth daily.   . Omega-3 Fatty Acids (FISH OIL) 1200 MG CAPS Take 2 capsules by mouth daily.  . predniSONE (DELTASONE) 10 MG tablet TAKE 4 TABLETS BY MOUTH DAILY FOR 2 DAYS THEN 3 TABLETS FOR 2 DAYS THEN 2 TABS FOR 2 DAYS THEN 1 TAB FOR 2 DAYS  . promethazine-dextromethorphan (PROMETHAZINE-DM) 6.25-15  MG/5ML syrup Take 5 mLs by mouth 4 (four) times daily as needed.  . traMADol (ULTRAM) 50 MG tablet Take 50 mg by mouth every 6 (six) hours as needed.  . butalbital-acetaminophen-caffeine (FIORICET) 50-325-40 MG tablet Take 1 tablet by mouth every 6 (six) hours as needed for headache. (Patient not taking: Reported on 01/19/2020)   No facility-administered encounter medications on file as of 01/19/2020.     Review of Systems  Review of Systems  Constitutional: Positive for fatigue.  HENT: Negative.   Respiratory: Negative for cough and shortness of breath.    Cardiovascular: Negative.  Negative for chest pain, palpitations and leg swelling.  Gastrointestinal: Negative.   Allergic/Immunologic: Negative.   Neurological: Positive for headaches.  Psychiatric/Behavioral: Positive for decreased concentration.       Physical Exam  BP 114/72 (BP Location: Left Arm, Patient Position: Sitting, Cuff Size: Small)   Pulse 75   Temp (!) 96.6 F (35.9 C)   Ht 5\' 2"  (1.575 m)   Wt 136 lb (61.7 kg)   LMP 12/26/2019   SpO2 98%   BMI 24.87 kg/m   Wt Readings from Last 5 Encounters:  01/19/20 136 lb (61.7 kg)  01/12/20 138 lb (62.6 kg)  02/20/18 145 lb (65.8 kg)  02/10/18 144 lb 12.8 oz (65.7 kg)  01/16/18 142 lb (64.4 kg)     Physical Exam Vitals and nursing note reviewed.  Constitutional:      General: She is not in acute distress.    Appearance: She is well-developed. She is not ill-appearing or toxic-appearing.  HENT:     Right Ear: Tympanic membrane normal.     Left Ear: Tympanic membrane normal.  Cardiovascular:     Rate and Rhythm: Normal rate and regular rhythm.  Pulmonary:     Effort: Pulmonary effort is normal.     Breath sounds: Normal breath sounds.  Musculoskeletal:     Right lower leg: No edema.     Left lower leg: No edema.  Neurological:     Mental Status: She is alert and oriented to person, place, and time.     Comments: Slow to answer questions during exam.       Assessment & Plan:   History of COVID-19 History of COVID-19 Fatigue Headache/trigemnial neuralgia: Patient continues to have severe headaches and fatigue. She has tried gabapentin, ultram, and prednisone with no relief. She continues to work even with extreme fatigue, but has had to cut back work hours. She has been to neurology for trigeminal neuralgia pain before she had covid and none of the treatments provided any relief. She is slowly improving. Her headaches are not as severe now. Fatigue is slowly improving as well.   Plan: Continue to  work reduced hours.  Drink plenty of fluids  Get plenty of rest  If headache becomes more severe please go to the ED    Memory loss:  Hopefully memory will improve with rest, headache relief, and recovery time.  Plan: If memory does not improve - may need referral to neurology.       Fenton Foy, NP 01/19/2020

## 2020-01-24 MED FILL — FLUoxetine HCL 20 MG CAPS: 20 | 30 days supply | Qty: 30 | Fill #2

## 2020-02-22 MED FILL — FLUoxetine HCL 20 MG CAPS: 20 | 30 days supply | Qty: 30 | Fill #3

## 2020-03-21 MED FILL — AMOXICILLIN 500 MG CAPSULE: 500 | 7 days supply | Qty: 21 | Fill #0

## 2020-04-10 MED FILL — MONTELUKAST SOD 10 MG TAB: 10 | 30 days supply | Qty: 30 | Fill #0

## 2020-04-10 MED FILL — FENOFIBRATE 145 MG TABS: 145 | 30 days supply | Qty: 30 | Fill #0

## 2020-04-10 MED FILL — IVERMECTIN 3 MG TABLET: 3 | 25 days supply | Qty: 25 | Fill #0

## 2020-05-01 MED FILL — FLUoxetine HCL 10 MG CAPS: 10 | 90 days supply | Qty: 135 | Fill #0

## 2020-05-22 MED FILL — ROSUVASTATIN CALCIUM 5 MG T: 5 | 84 days supply | Qty: 12 | Fill #1

## 2020-05-25 LAB — HEPATIC FUNCTION PANEL
ALT: 9 (ref 7–35)
AST: 15 (ref 13–35)
Alkaline Phosphatase: 45 (ref 25–125)
Bilirubin, Total: 0.3

## 2020-05-25 LAB — BASIC METABOLIC PANEL
BUN: 20 (ref 4–21)
CO2: 30 — AB (ref 13–22)
Chloride: 104 (ref 99–108)
Creatinine: 1 (ref 0.5–1.1)
Glucose: 103
Potassium: 4.3 (ref 3.4–5.3)
Sodium: 143 (ref 137–147)

## 2020-05-25 LAB — COMPREHENSIVE METABOLIC PANEL
Albumin: 4.1 (ref 3.5–5.0)
Calcium: 9.5 (ref 8.7–10.7)
GFR calc Af Amer: 73
GFR calc non Af Amer: 60

## 2020-05-25 LAB — POCT ERYTHROCYTE SEDIMENTATION RATE, NON-AUTOMATED: Sed Rate: 5

## 2020-06-27 MED FILL — FENOFIBRATE 145 MG TABS: 145 | 30 days supply | Qty: 30 | Fill #0

## 2020-06-27 MED FILL — IVERMECTIN 3 MG TABLET: 3 | 30 days supply | Qty: 48 | Fill #0

## 2020-06-27 MED FILL — predniSONE 20 MG TABS: 20 | 15 days supply | Qty: 20 | Fill #0

## 2020-08-09 MED FILL — FLUoxetine HCL 10 MG CAPS: 10 | 90 days supply | Qty: 135 | Fill #1

## 2020-08-17 DIAGNOSIS — R87619 Unspecified abnormal cytological findings in specimens from cervix uteri: Secondary | ICD-10-CM | POA: Insufficient documentation

## 2020-08-17 DIAGNOSIS — G43909 Migraine, unspecified, not intractable, without status migrainosus: Secondary | ICD-10-CM | POA: Insufficient documentation

## 2020-08-17 DIAGNOSIS — D649 Anemia, unspecified: Secondary | ICD-10-CM | POA: Insufficient documentation

## 2020-08-17 HISTORY — DX: Migraine, unspecified, not intractable, without status migrainosus: G43.909

## 2020-08-17 HISTORY — DX: Unspecified abnormal cytological findings in specimens from cervix uteri: R87.619

## 2020-08-22 DIAGNOSIS — E785 Hyperlipidemia, unspecified: Secondary | ICD-10-CM | POA: Insufficient documentation

## 2020-08-22 DIAGNOSIS — F419 Anxiety disorder, unspecified: Secondary | ICD-10-CM | POA: Insufficient documentation

## 2020-08-22 DIAGNOSIS — K219 Gastro-esophageal reflux disease without esophagitis: Secondary | ICD-10-CM | POA: Insufficient documentation

## 2020-10-20 MED FILL — GABAPENTIN 300 MG CAPSULE: 300 | 30 days supply | Qty: 90 | Fill #1

## 2020-10-21 HISTORY — PX: DACRORHINOCYSTOTOMY: SHX5559

## 2020-10-30 DIAGNOSIS — G500 Trigeminal neuralgia: Secondary | ICD-10-CM | POA: Insufficient documentation

## 2020-10-30 DIAGNOSIS — G629 Polyneuropathy, unspecified: Secondary | ICD-10-CM | POA: Insufficient documentation

## 2020-10-30 DIAGNOSIS — U099 Post covid-19 condition, unspecified: Secondary | ICD-10-CM | POA: Insufficient documentation

## 2020-10-30 HISTORY — DX: Post covid-19 condition, unspecified: U09.9

## 2020-11-02 ENCOUNTER — Other Ambulatory Visit (HOSPITAL_BASED_OUTPATIENT_CLINIC_OR_DEPARTMENT_OTHER): Payer: Self-pay | Admitting: Ophthalmology

## 2020-11-02 DIAGNOSIS — K5902 Outlet dysfunction constipation: Secondary | ICD-10-CM | POA: Insufficient documentation

## 2020-11-02 DIAGNOSIS — H04222 Epiphora due to insufficient drainage, left lacrimal gland: Secondary | ICD-10-CM | POA: Diagnosis not present

## 2020-11-02 DIAGNOSIS — H04429 Chronic lacrimal canaliculitis of unspecified lacrimal passage: Secondary | ICD-10-CM | POA: Insufficient documentation

## 2020-11-02 DIAGNOSIS — E7800 Pure hypercholesterolemia, unspecified: Secondary | ICD-10-CM | POA: Insufficient documentation

## 2020-11-02 DIAGNOSIS — H04512 Dacryolith of left lacrimal passage: Secondary | ICD-10-CM | POA: Diagnosis not present

## 2020-11-02 DIAGNOSIS — H04552 Acquired stenosis of left nasolacrimal duct: Secondary | ICD-10-CM | POA: Diagnosis not present

## 2020-11-02 DIAGNOSIS — R143 Flatulence: Secondary | ICD-10-CM | POA: Insufficient documentation

## 2020-11-02 HISTORY — DX: Chronic lacrimal canaliculitis of unspecified lacrimal passage: H04.429

## 2020-11-02 MED FILL — NEO/POLYMYXIN/DEXAMETH DROP: 3.5-10000-0 | 25 days supply | Qty: 5 | Fill #0

## 2020-11-03 ENCOUNTER — Other Ambulatory Visit: Payer: Self-pay | Admitting: Obstetrics and Gynecology

## 2020-11-03 DIAGNOSIS — Z1231 Encounter for screening mammogram for malignant neoplasm of breast: Secondary | ICD-10-CM

## 2020-11-20 DIAGNOSIS — H04552 Acquired stenosis of left nasolacrimal duct: Secondary | ICD-10-CM | POA: Diagnosis not present

## 2020-11-27 ENCOUNTER — Other Ambulatory Visit (HOSPITAL_BASED_OUTPATIENT_CLINIC_OR_DEPARTMENT_OTHER): Payer: Self-pay | Admitting: Family Medicine

## 2020-11-27 MED FILL — FLUoxetine HCL 10 MG CAPS: 10 | 90 days supply | Qty: 135 | Fill #0

## 2020-12-18 ENCOUNTER — Ambulatory Visit: Payer: No Typology Code available for payment source

## 2020-12-29 ENCOUNTER — Other Ambulatory Visit (HOSPITAL_BASED_OUTPATIENT_CLINIC_OR_DEPARTMENT_OTHER): Payer: Self-pay | Admitting: Family Medicine

## 2021-01-03 ENCOUNTER — Other Ambulatory Visit (HOSPITAL_BASED_OUTPATIENT_CLINIC_OR_DEPARTMENT_OTHER): Payer: Self-pay | Admitting: Family Medicine

## 2021-01-03 DIAGNOSIS — M546 Pain in thoracic spine: Secondary | ICD-10-CM | POA: Diagnosis not present

## 2021-01-17 DIAGNOSIS — H04552 Acquired stenosis of left nasolacrimal duct: Secondary | ICD-10-CM | POA: Diagnosis not present

## 2021-01-17 DIAGNOSIS — G500 Trigeminal neuralgia: Secondary | ICD-10-CM | POA: Diagnosis not present

## 2021-01-17 DIAGNOSIS — H04222 Epiphora due to insufficient drainage, left lacrimal gland: Secondary | ICD-10-CM | POA: Diagnosis not present

## 2021-01-22 NOTE — Progress Notes (Signed)
[facility] [address] [place] Phone: [phone] Subjective:   I [name] am serving as a Education administrator for Dr. Hulan Saas.  This visit occurred during the SARS-CoV-2 public health emergency.  Safety protocols were in place, including screening questions prior to the visit, additional usage of staff PPE, and extensive cleaning of exam room while observing appropriate contact time as indicated for disinfecting solutions.   I'm seeing this patient by the request  of:  Shirline Frees, MD  CC: Left upper back pain  [id number]  Michele [name] is a 55 y.o. female coming in with complaint of back pain. States she was told by chiropractor that she has a rib out. Sometimes inhaling causes pain. States with certain ranges of motion she feels a catch.   Onset- December Location - Upper trap left sided that radiates to thoracic spine  Duration-  Character- sore and tight Aggravating factors- increased activity = increased pain Reliving factors-  Therapies tried- ice, heat, oral and topical medication  Severity- 8/10 at its worse    Patient had an MRI of the brain in May 2019.  Found to have a type I Arnold-Chiari malformation as well as chronic trace nasal sinusitis.  Past Medical History:  Diagnosis Date  . Abdominal bloating   . Anxiety   . Back pain   . DDD (degenerative disc disease), cervical   . Dorsalgia   . Dorsalgia   . GERD (gastroesophageal reflux disease)   . Hypercholesterolemia   . Hyperlipidemia   . Intestinal gas excretion    Past Surgical History:  Procedure Laterality Date  . BASAL CELL CARCINOMA EXCISION    . BREAST BIOPSY Left    Duct removal (No scar seen)   . BREAST LUMPECTOMY Left 1992  . LEFT HEART CATH AND CORONARY ANGIOGRAPHY N/A 01/16/2018   Procedure: LEFT HEART CATH AND CORONARY ANGIOGRAPHY;  Surgeon: Nelva Bush, MD;  Location: [facility];  Service: Cardiovascular;  Laterality: N/A;    Social History   Socioeconomic History  . Marital status: Married    Spouse name: Not on file  . Number of children: Not on file  . Years of education: Not on file  . Highest education level: Not on file  Occupational History  . Not on file  Tobacco Use  . Smoking status: Never Smoker  . Smokeless tobacco: Never Used  Substance and Sexual Activity  . Alcohol use: Yes    Alcohol/week: 4.0 standard drinks    Types: 4 Glasses of wine per week  . Drug use: No  . Sexual activity: Not on file  Other Topics Concern  . Not on file  Social History Narrative  . Not on file   Social Determinants of Health   Financial Resource Strain: Not on file  Food Insecurity: Not on file  Transportation Needs: Not on file  Physical Activity: Not on file  Stress: Not on file  Social Connections: Not on file   Allergies  Allergen Reactions  . Lipitor [Atorvastatin Calcium]     Memory issue  . Celebrex [Celecoxib] Rash   Family History  Problem Relation Age of Onset  . Hypertension Mother   . Diabetes Mother   . Diabetes Father   . Heart attack Father   . Stroke Maternal Uncle   . Heart attack Sister   . Breast cancer Neg Hx     Current Outpatient Medications (Endocrine & Metabolic):  .  predniSONE (DELTASONE)  10 MG tablet, TAKE 4 TABLETS BY MOUTH DAILY FOR 2 DAYS THEN 3 TABLETS FOR 2 DAYS THEN 2 TABS FOR 2 DAYS THEN 1 TAB FOR 2 DAYS   Current Outpatient Medications (Respiratory):  .  promethazine-dextromethorphan (PROMETHAZINE-DM) 6.25-15 MG/5ML syrup, Take 5 mLs by mouth 4 (four) times daily as needed.  Current Outpatient Medications (Analgesics):  .  traMADol (ULTRAM) 50 MG tablet, Take 50 mg by mouth every 6 (six) hours as needed.   Current Outpatient Medications (Other):  .  b complex vitamins tablet, Take 1 tablet by mouth daily. .  cyclobenzaprine (FLEXERIL) 10 MG tablet, TAKE 1 TABLET BY MOUTH AT BEDTIME AS NEEDED ONCE A DAY FOR MUSCLE SPASM .  dexlansoprazole  (DEXILANT) 60 MG capsule, Take 60 mg by mouth daily. Marland Kitchen  FLUoxetine (PROZAC) 10 MG capsule, TAKE 1 CAPSULE BY MOUTH ONCE DAILY ALTERNATING WITH 2 CAPSULES EVERY OTHER DAY **NEEDS OFFICE VISIT TO FOLLOW-UP CHRONIC CONDITIONS** .  FLUoxetine (PROZAC) 20 MG tablet, Take 20 mg by mouth daily. Marland Kitchen  gabapentin (NEURONTIN) 300 MG capsule, Take 300 mg by mouth 3 (three) times daily. Marland Kitchen  ketotifen (ZADITOR) 0.025 % ophthalmic solution, Place 1-2 drops into both eyes at bedtime. .  lidocaine (LIDODERM) 5 %, APPLY 1-3 PATCHES EXTERNALLY TO AFFECTED AREA(S) REMOVE AFTER 12 HOURS ONCE A DAY .  Multiple Minerals-Vitamins (CALCIUM-MAGNESIUM-ZINC-D3) TABS, Take 1-2 tablets by mouth daily.  Marland Kitchen  neomycin-polymyxin b-dexamethasone (MAXITROL) 3.5-10000-0.1 SUSP, PLACE 1 DROP IN EYE ON OPERATED SIDE 4 TIMES PER DAY FOR 1 WEEK .  Omega-3 Fatty Acids (FISH OIL) 1200 MG CAPS, Take 2 capsules by mouth daily. Marland Kitchen  tiZANidine (ZANAFLEX) 2 MG tablet, TAKE 1 TABLET BY MOUTH THREE TIMES DAILY AS NEEDED FOR MUSCLE PAIN .  gabapentin (NEURONTIN) 300 MG capsule, TAKE 1 CAPSULE BY MOUTH THREE TIMES DAILY   Reviewed prior external information including notes and imaging from  primary care provider As well as notes that were available from care everywhere and other healthcare systems.  Past medical history, social, surgical and family history all reviewed in electronic medical record.  No pertanent information unless stated regarding to the chief complaint.   Review of Systems:  No  visual changes, nausea, vomiting, diarrhea, constipation, dizziness, abdominal pain, skin rash, fevers, chills, night sweats, weight loss, swollen lymph nodes, body aches, joint swelling, chest pain, shortness of breath, mood changes. POSITIVE muscle aches, headache  Objective  Blood pressure 110/82, pulse 77, height 5\' 2"  (1.575 m), weight 146 lb (66.2 kg), last menstrual period 12/26/2019, SpO2 100 %.   General: No apparent distress alert and oriented  x3 mood and affect normal, dressed appropriately.  HEENT: Pupils equal, extraocular movements intact  Respiratory: Patient's speak in full sentences and does not appear short of breath  Cardiovascular: No lower extremity edema, non tender, no erythema  Gait normal with good balance and coordination.  MSK: Left shoulder exam shows the patient does have mild scapular dyskinesis.  Very mild positive impingement.  Neck exam does have some tightness noted left side.  Negative Spurling's though noted.  Osteopathic findings C4 flexed rotated and side bent left T4 extended rotated and side bent left with inhaled rib     Impression and Recommendations:     The above documentation has been reviewed and is accurate and complete Lyndal Pulley, DO

## 2021-01-23 ENCOUNTER — Other Ambulatory Visit: Payer: Self-pay

## 2021-01-23 ENCOUNTER — Ambulatory Visit (INDEPENDENT_AMBULATORY_CARE_PROVIDER_SITE_OTHER): Payer: 59

## 2021-01-23 ENCOUNTER — Ambulatory Visit (INDEPENDENT_AMBULATORY_CARE_PROVIDER_SITE_OTHER): Payer: 59 | Admitting: Family Medicine

## 2021-01-23 ENCOUNTER — Encounter: Payer: Self-pay | Admitting: Family Medicine

## 2021-01-23 VITALS — BP 110/82 | HR 77 | Ht 62.0 in | Wt 146.0 lb

## 2021-01-23 DIAGNOSIS — G2589 Other specified extrapyramidal and movement disorders: Secondary | ICD-10-CM

## 2021-01-23 DIAGNOSIS — M546 Pain in thoracic spine: Secondary | ICD-10-CM

## 2021-01-23 DIAGNOSIS — M4804 Spinal stenosis, thoracic region: Secondary | ICD-10-CM | POA: Diagnosis not present

## 2021-01-23 DIAGNOSIS — M542 Cervicalgia: Secondary | ICD-10-CM

## 2021-01-23 DIAGNOSIS — M999 Biomechanical lesion, unspecified: Secondary | ICD-10-CM

## 2021-01-23 DIAGNOSIS — M4802 Spinal stenosis, cervical region: Secondary | ICD-10-CM | POA: Diagnosis not present

## 2021-01-23 HISTORY — DX: Biomechanical lesion, unspecified: M99.9

## 2021-01-23 HISTORY — DX: Other specified extrapyramidal and movement disorders: G25.89

## 2021-01-23 NOTE — Assessment & Plan Note (Signed)
   Decision today to treat with OMT was based on Physical Exam  After verbal consent patient was treated with HVLA, ME, FPR techniques in cervical, thoracic, rib areas, al

## 2021-01-23 NOTE — Assessment & Plan Note (Signed)
Patient has more on the left side seems to be more of a scapular dyskinesis.  Some mild trapezius muscle tightness noted.  We will get x-rays of the thoracic and cervical spine to further evaluate.  Patient given a muscle relaxer to help her with some nighttime pain and some of the muscle tightness.  Responded fairly well to osteopathic manipulation.  Discussed the potential short term use of gastroesophageal reflux medications secondary to the area of where this information is.  Follow-up with me again 6 weeks

## 2021-01-23 NOTE — Patient Instructions (Addendum)
Good to see you Neck and thoracic xrays Exercise 3 times a week Nexium daily for 2 weeks Tart cherry extract 1200mg  at night Continue Vit D See me again in 3-4 weeks

## 2021-02-01 ENCOUNTER — Telehealth: Payer: Self-pay | Admitting: Family Medicine

## 2021-02-01 ENCOUNTER — Other Ambulatory Visit: Payer: Self-pay

## 2021-02-01 DIAGNOSIS — G2589 Other specified extrapyramidal and movement disorders: Secondary | ICD-10-CM

## 2021-02-01 NOTE — Telephone Encounter (Signed)
Spoke with patient. Pain in thoracic spine is increasing. Would like MRI. Ordered and patient notified.

## 2021-02-01 NOTE — Telephone Encounter (Signed)
Pt seen 2 weeks ago, has followed our instructions but feel her back/side pain is getting worse and would like to have an MRI done if Dr. Tamala Julian agrees.

## 2021-02-02 ENCOUNTER — Inpatient Hospital Stay: Admission: RE | Admit: 2021-02-02 | Payer: Self-pay | Source: Ambulatory Visit

## 2021-02-09 ENCOUNTER — Other Ambulatory Visit: Payer: Self-pay

## 2021-02-09 ENCOUNTER — Ambulatory Visit
Admission: RE | Admit: 2021-02-09 | Discharge: 2021-02-09 | Disposition: A | Discharge status: Home / Self Care | Payer: 59 | Source: Ambulatory Visit | Attending: Family Medicine | Admitting: Family Medicine

## 2021-02-09 DIAGNOSIS — M47814 Spondylosis without myelopathy or radiculopathy, thoracic region: Secondary | ICD-10-CM | POA: Diagnosis not present

## 2021-02-09 DIAGNOSIS — G2589 Other specified extrapyramidal and movement disorders: Secondary | ICD-10-CM

## 2021-02-09 DIAGNOSIS — M5124 Other intervertebral disc displacement, thoracic region: Secondary | ICD-10-CM | POA: Diagnosis not present

## 2021-02-11 ENCOUNTER — Encounter: Payer: Self-pay | Admitting: Family Medicine

## 2021-02-19 DIAGNOSIS — G500 Trigeminal neuralgia: Secondary | ICD-10-CM | POA: Diagnosis not present

## 2021-02-19 DIAGNOSIS — H04222 Epiphora due to insufficient drainage, left lacrimal gland: Secondary | ICD-10-CM | POA: Diagnosis not present

## 2021-02-19 DIAGNOSIS — H04552 Acquired stenosis of left nasolacrimal duct: Secondary | ICD-10-CM | POA: Diagnosis not present

## 2021-02-20 NOTE — Progress Notes (Signed)
[facility] [address] [place] Phone: [phone] Subjective:   I Michele [name] am serving as a Education administrator for Dr. Hulan Saas.  This visit occurred during the SARS-CoV-2 public health emergency.  Safety protocols were in place, including screening questions prior to the visit, additional usage of staff PPE, and extensive cleaning of exam room while observing appropriate contact time as indicated for disinfecting solutions.   I'm seeing this patient by the request  of:  Michele Frees, MD  CC: Neck and neck pain follow-up  [id number]  Michele [name] is a 55 y.o. female coming in with complaint of back and neck pain. OMT 01/23/2021. Patient states she just left the chiropractor so her back is doing well. States yesterday was difficult. Yesterday afternoon she had increased pain.  Patient states that at the end of a long day can have more discomfort.  Seems to be more on the left side of the thoracic spine still.  Patient was having worsening pain and we did send for an MRI.  MRI was reviewed today.  Patient's MRI of the thoracic spine did show some facet arthropathy from T2-T5 but no significant compression noted.  Patient points to the pain though seems to be more at the T7-T8 area.  Patient did give 2 weeks of the Prozac with no significant improvement.  Medications patient has been prescribed: None          Reviewed prior external information including notes and imaging from previsou exam, outside providers and external EMR if available.  This includes the MRI as stated above.  As well as notes that were available from care everywhere and other healthcare systems.  Past medical history, social, surgical and family history all reviewed in electronic medical record.  No pertanent information unless stated regarding to the chief complaint.   Past Medical History:  Diagnosis Date  . Abdominal bloating   . Anxiety   . Back pain   .  DDD (degenerative disc disease), cervical   . Dorsalgia   . Dorsalgia   . GERD (gastroesophageal reflux disease)   . Hypercholesterolemia   . Hyperlipidemia   . Intestinal gas excretion     Allergies  Allergen Reactions  . Lipitor [Atorvastatin Calcium]     Memory issue  . Celebrex [Celecoxib] Rash     Review of Systems:  No headache, visual changes, nausea, vomiting, diarrhea, constipation, dizziness, abdominal pain, skin rash, fevers, chills, night sweats, weight loss, swollen lymph nodes, body aches, joint swelling, chest pain, shortness of breath, mood changes. POSITIVE muscle aches  Objective  Blood pressure 118/80, pulse 66, height 5\' 2"  (1.575 m), weight 144 lb (65.3 kg), last menstrual period 12/26/2019, SpO2 100 %.   General: No apparent distress alert and oriented x3 mood and affect normal, dressed appropriately.  HEENT: Pupils equal, extraocular movements intact  Respiratory: Patient's speak in full sentences and does not appear short of breath  Cardiovascular: No lower extremity edema, non tender, no erythema  Neuro: Cranial nerves II through XII are intact, neurovascularly intact in all extremities with 2+ DTRs and 2+ pulses.  Gait normal with good balance and coordination.  MSK:  Non tender with full range of motion and good stability and symmetric strength and tone of shoulders, elbows, wrist, hip, knee and ankles bilaterally.  Back -mid back does have some mild tightness noted.  Continue scapular dyskinesis noted with patient having the inferior left side of the scapula with posterior and  lateral winging noted.  Mild weakness in this area.         Assessment and Plan:       The above documentation has been reviewed and is accurate and complete Michele Pulley, DO       Note: This dictation was prepared with Dragon dictation along with smaller phrase technology. Any transcriptional errors that result from this process are unintentional.

## 2021-02-21 ENCOUNTER — Encounter: Payer: Self-pay | Admitting: Family Medicine

## 2021-02-21 ENCOUNTER — Ambulatory Visit (INDEPENDENT_AMBULATORY_CARE_PROVIDER_SITE_OTHER): Payer: 59 | Admitting: Family Medicine

## 2021-02-21 ENCOUNTER — Other Ambulatory Visit (HOSPITAL_BASED_OUTPATIENT_CLINIC_OR_DEPARTMENT_OTHER): Payer: Self-pay

## 2021-02-21 ENCOUNTER — Other Ambulatory Visit: Payer: Self-pay

## 2021-02-21 DIAGNOSIS — G2589 Other specified extrapyramidal and movement disorders: Secondary | ICD-10-CM | POA: Diagnosis not present

## 2021-02-21 NOTE — Patient Instructions (Addendum)
Good to see you Keeping working on scapular exercises Do posture exercise on the door for 5 mins a day See me again in 5-6 week

## 2021-02-21 NOTE — Assessment & Plan Note (Signed)
Still believe that most of this is secondary to scapular dyskinesis.  Patient did have the MRI of the thoracic spine that did show some very mild facet arthropathy.  Do not think that injections would be necessary at the moment.  I am likely optimistic that patient will respond.  He does have the Zanaflex for nighttime.  Given other postural exercises that I think will be beneficial.  Held on any manipulation with patient seeing chiropractor today.  Follow-up with me again 6 weeks

## 2021-02-23 ENCOUNTER — Other Ambulatory Visit (HOSPITAL_BASED_OUTPATIENT_CLINIC_OR_DEPARTMENT_OTHER): Payer: Self-pay

## 2021-02-23 MED ORDER — GABAPENTIN 300 MG PO CAPS
ORAL_CAPSULE | ORAL | 11 refills | Status: DC
  Filled 2021-02-23: qty 90, 30d supply, fill #0
  Filled 2021-04-12: qty 90, 30d supply, fill #1
  Filled 2021-06-13: qty 90, 30d supply, fill #2
  Filled 2021-08-06: qty 90, 30d supply, fill #3
  Filled 2021-09-14: qty 90, 30d supply, fill #4
  Filled 2022-01-03: qty 90, 30d supply, fill #5

## 2021-03-01 ENCOUNTER — Encounter: Payer: Self-pay | Admitting: Family Medicine

## 2021-03-12 ENCOUNTER — Other Ambulatory Visit (HOSPITAL_BASED_OUTPATIENT_CLINIC_OR_DEPARTMENT_OTHER): Payer: Self-pay

## 2021-03-13 ENCOUNTER — Other Ambulatory Visit (HOSPITAL_BASED_OUTPATIENT_CLINIC_OR_DEPARTMENT_OTHER): Payer: Self-pay

## 2021-03-13 MED ORDER — FLUOXETINE HCL 10 MG PO CAPS
ORAL_CAPSULE | ORAL | 0 refills | Status: DC
  Filled 2021-03-13: qty 135, 90d supply, fill #0

## 2021-03-14 ENCOUNTER — Other Ambulatory Visit (HOSPITAL_BASED_OUTPATIENT_CLINIC_OR_DEPARTMENT_OTHER): Payer: Self-pay

## 2021-03-14 MED ORDER — FLUOXETINE HCL 10 MG PO CAPS
ORAL_CAPSULE | ORAL | 2 refills | Status: DC
  Filled 2021-03-14: qty 135, 90d supply, fill #0

## 2021-03-23 ENCOUNTER — Ambulatory Visit: Payer: Self-pay

## 2021-04-03 NOTE — Progress Notes (Signed)
[facility] [address] [place] Phone: [phone] Subjective:   I Michele [name] am serving as a Education administrator for Dr. Hulan Saas.  This visit occurred during the SARS-CoV-2 public health emergency.  Safety protocols were in place, including screening questions prior to the visit, additional usage of staff PPE, and extensive cleaning of exam room while observing appropriate contact time as indicated for disinfecting solutions.   I'm seeing this patient by the request  of:  Shirline Frees, MD  CC: Neck pain and upper back pain  [id number]  02/21/2021 Still believe that most of this is secondary to scapular dyskinesis.  Patient did have the MRI of the thoracic spine that did show some very mild facet arthropathy.  Do not think that injections would be necessary at the moment.  I am likely optimistic that patient will respond.  He does have the Zanaflex for nighttime.  Given other postural exercises that I think will be beneficial.  Held on any manipulation with patient seeing chiropractor today.  Follow-up with me again 6 weeks  Update 04/04/2021 Michele [name] is a 55 y.o. female coming in with complaint of upper back pain. Patient states she is slowly but consistently getting better.  Patient has noticed more right-sided neck pain recently.  Seems to be more of a tightness.  Patient denies any recent injury.  He did do a lot more activity that still think she is exacerbated.  Patient states that the left side of the thoracic spine was doing somewhat better.     Past Medical History:  Diagnosis Date   Abdominal bloating    Anxiety    Back pain    DDD (degenerative disc disease), cervical    Dorsalgia    Dorsalgia    GERD (gastroesophageal reflux disease)    Hypercholesterolemia    Hyperlipidemia    Intestinal gas excretion    Past Surgical History:  Procedure Laterality Date   BASAL CELL CARCINOMA EXCISION     BREAST BIOPSY Left     Duct removal (No scar seen)    BREAST LUMPECTOMY Left 1992   LEFT HEART CATH AND CORONARY ANGIOGRAPHY N/A 01/16/2018   Procedure: LEFT HEART CATH AND CORONARY ANGIOGRAPHY;  Surgeon: Nelva Bush, MD;  Location: [facility];  Service: Cardiovascular;  Laterality: N/A;   Social History   Socioeconomic History   Marital status: Married    Spouse name: Not on file   Number of children: Not on file   Years of education: Not on file   Highest education level: Not on file  Occupational History   Not on file  Tobacco Use   Smoking status: Never   Smokeless tobacco: Never  Substance and Sexual Activity   Alcohol use: Yes    Alcohol/week: 4.0 standard drinks    Types: 4 Glasses of wine per week   Drug use: No   Sexual activity: Not on file  Other Topics Concern   Not on file  Social History Narrative   Not on file   Social Determinants of Health   Financial Resource Strain: Not on file  Food Insecurity: Not on file  Transportation Needs: Not on file  Physical Activity: Not on file  Stress: Not on file  Social Connections: Not on file   Allergies  Allergen Reactions   Lipitor [Atorvastatin Calcium]     Memory issue   Celebrex [Celecoxib] Rash   Family History  Problem Relation Age of Onset  Hypertension Mother    Diabetes Mother    Diabetes Father    Heart attack Father    Stroke Maternal Uncle    Heart attack Sister    Breast cancer Neg Hx     Current Outpatient Medications (Endocrine & Metabolic):    predniSONE (DELTASONE) 10 MG tablet, TAKE 4 TABLETS BY MOUTH DAILY FOR 2 DAYS THEN 3 TABLETS FOR 2 DAYS THEN 2 TABS FOR 2 DAYS THEN 1 TAB FOR 2 DAYS   Current Outpatient Medications (Respiratory):    promethazine-dextromethorphan (PROMETHAZINE-DM) 6.25-15 MG/5ML syrup, Take 5 mLs by mouth 4 (four) times daily as needed.  Current Outpatient Medications (Analgesics):    traMADol (ULTRAM) 50 MG tablet, Take 50 mg by mouth every 6 (six) hours as  needed.   Current Outpatient Medications (Other):    b complex vitamins tablet, Take 1 tablet by mouth daily.   cyclobenzaprine (FLEXERIL) 10 MG tablet, TAKE 1 TABLET BY MOUTH AT BEDTIME AS NEEDED ONCE A DAY FOR MUSCLE SPASM   dexlansoprazole (DEXILANT) 60 MG capsule, Take 60 mg by mouth daily.   FLUoxetine (PROZAC) 10 MG capsule, TAKE 1 CAPSULE BY MOUTH ONCE DAILY ALTERNATING WITH 2 CAPSULES EVERY OTHER DAY **NEEDS OFFICE VISIT TO FOLLOW-UP CHRONIC CONDITIONS**   FLUoxetine (PROZAC) 10 MG capsule, TAKE 1 CAPSULE BY MOUTH ONCE DAILY ALTERNATING WITH 2 CAPSULES EVERY OTHER DAY   FLUoxetine (PROZAC) 20 MG tablet, Take 20 mg by mouth daily.   gabapentin (NEURONTIN) 300 MG capsule, Take 300 mg by mouth 3 (three) times daily.   gabapentin (NEURONTIN) 300 MG capsule, take 1 capsule by mouth 3 times a day   ketotifen (ZADITOR) 0.025 % ophthalmic solution, Place 1-2 drops into both eyes at bedtime.   lidocaine (LIDODERM) 5 %, APPLY 1-3 PATCHES EXTERNALLY TO AFFECTED AREA(S) REMOVE AFTER 12 HOURS ONCE A DAY   Multiple Minerals-Vitamins (CALCIUM-MAGNESIUM-ZINC-D3) TABS, Take 1-2 tablets by mouth daily.    neomycin-polymyxin b-dexamethasone (MAXITROL) 3.5-10000-0.1 SUSP, PLACE 1 DROP IN EYE ON OPERATED SIDE 4 TIMES PER DAY FOR 1 WEEK   Omega-3 Fatty Acids (FISH OIL) 1200 MG CAPS, Take 2 capsules by mouth daily.   tiZANidine (ZANAFLEX) 2 MG tablet, TAKE 1 TABLET BY MOUTH THREE TIMES DAILY AS NEEDED FOR MUSCLE PAIN   gabapentin (NEURONTIN) 300 MG capsule, TAKE 1 CAPSULE BY MOUTH THREE TIMES DAILY   Reviewed prior external information including notes and imaging from  primary care provider As well as notes that were available from care everywhere and other healthcare systems.  Past medical history, social, surgical and family history all reviewed in electronic medical record.  No pertanent information unless stated regarding to the chief complaint.   Review of Systems:  No headache, visual changes,  nausea, vomiting, diarrhea, constipation, dizziness, abdominal pain, skin rash, fevers, chills, night sweats, weight loss, swollen lymph nodes, body aches, joint swelling, chest pain, shortness of breath, mood changes. POSITIVE muscle aches  Objective  Blood pressure 110/70, pulse 77, height 5\' 2"  (1.575 m), weight 143 lb (64.9 kg), last menstrual period 12/26/2019, SpO2 98 %.   General: No apparent distress alert and oriented x3 mood and affect normal, dressed appropriately.  HEENT: Pupils equal, extraocular movements intact  Respiratory: Patient's speak in full sentences and does not appear short of breath  Cardiovascular: No lower extremity edema, non tender, no erythema  Gait normal with good balance and coordination.  MSK: Neck exam does have some mild loss of lordosis.  Significant tightness and muscle spasm noted of the  right trapezius.  Multiple trigger points noted in the area.  Patient does have good grip strength   Osteopathic findings C7 flexed rotated and side bent right T4 extended rotated and side bent right with inhaled rib T7 extended rotated and side bent left   Impression and Recommendations:     The above documentation has been reviewed and is accurate and complete Lyndal Pulley, DO

## 2021-04-04 ENCOUNTER — Ambulatory Visit (INDEPENDENT_AMBULATORY_CARE_PROVIDER_SITE_OTHER): Payer: 59 | Admitting: Family Medicine

## 2021-04-04 ENCOUNTER — Other Ambulatory Visit: Payer: Self-pay

## 2021-04-04 ENCOUNTER — Encounter: Payer: Self-pay | Admitting: Family Medicine

## 2021-04-04 VITALS — BP 110/70 | HR 77 | Ht 62.0 in | Wt 143.0 lb

## 2021-04-04 DIAGNOSIS — M9902 Segmental and somatic dysfunction of thoracic region: Secondary | ICD-10-CM

## 2021-04-04 DIAGNOSIS — M9908 Segmental and somatic dysfunction of rib cage: Secondary | ICD-10-CM | POA: Diagnosis not present

## 2021-04-04 DIAGNOSIS — G2589 Other specified extrapyramidal and movement disorders: Secondary | ICD-10-CM

## 2021-04-04 DIAGNOSIS — M999 Biomechanical lesion, unspecified: Secondary | ICD-10-CM | POA: Diagnosis not present

## 2021-04-04 DIAGNOSIS — M9901 Segmental and somatic dysfunction of cervical region: Secondary | ICD-10-CM | POA: Diagnosis not present

## 2021-04-04 NOTE — Patient Instructions (Addendum)
Good to see you Tried manipulation on the neck Continue exercises Ice after activity See me again in 2 months

## 2021-04-04 NOTE — Assessment & Plan Note (Signed)
Patient does have the scapular dyskinesis.  Attempted osteopathic manipulation with some mild improvement noted.  We discussed avoiding certain activities still.  Patient will continue to work on the strengthening.  Worsening pain can consider the possibility of trigger point injections.  Follow-up with me again in 2 to 3 months

## 2021-04-04 NOTE — Assessment & Plan Note (Signed)
   Decision today to treat with OMT was based on Physical Exam  After verbal consent patient was treated with HVLA, ME, FPR techniques in cervical, thoracic, rib,areas, all areas are chronic   Patient tolerated the procedure well with improvement in symptoms  Patient given exercises, stretches and lifestyle modifications  See medications in patient instructions if given  Patient will follow up in 4-8 weeks 

## 2021-04-12 ENCOUNTER — Other Ambulatory Visit (HOSPITAL_BASED_OUTPATIENT_CLINIC_OR_DEPARTMENT_OTHER): Payer: Self-pay

## 2021-04-26 ENCOUNTER — Ambulatory Visit: Payer: Self-pay

## 2021-05-03 ENCOUNTER — Other Ambulatory Visit: Payer: Self-pay

## 2021-05-03 ENCOUNTER — Other Ambulatory Visit (HOSPITAL_BASED_OUTPATIENT_CLINIC_OR_DEPARTMENT_OTHER): Payer: Self-pay

## 2021-05-03 ENCOUNTER — Ambulatory Visit
Admission: RE | Admit: 2021-05-03 | Discharge: 2021-05-03 | Disposition: A | Discharge status: Home / Self Care | Payer: Self-pay | Source: Ambulatory Visit | Attending: Obstetrics and Gynecology | Admitting: Obstetrics and Gynecology

## 2021-05-03 DIAGNOSIS — Z1231 Encounter for screening mammogram for malignant neoplasm of breast: Secondary | ICD-10-CM | POA: Diagnosis not present

## 2021-05-03 DIAGNOSIS — E7800 Pure hypercholesterolemia, unspecified: Secondary | ICD-10-CM | POA: Diagnosis not present

## 2021-05-03 DIAGNOSIS — F419 Anxiety disorder, unspecified: Secondary | ICD-10-CM | POA: Diagnosis not present

## 2021-05-03 DIAGNOSIS — L259 Unspecified contact dermatitis, unspecified cause: Secondary | ICD-10-CM | POA: Diagnosis not present

## 2021-05-03 LAB — HM MAMMOGRAPHY

## 2021-05-03 MED ORDER — CLOBETASOL PROPIONATE 0.05 % EX OINT
TOPICAL_OINTMENT | CUTANEOUS | 0 refills | Status: DC
  Filled 2021-05-03: qty 60, 14d supply, fill #0

## 2021-05-03 MED ORDER — PREDNISONE 10 MG PO TABS
ORAL_TABLET | ORAL | 0 refills | Status: DC
  Filled 2021-05-03: qty 20, 8d supply, fill #0

## 2021-05-03 MED ORDER — FLUOXETINE HCL 10 MG PO CAPS
ORAL_CAPSULE | ORAL | 11 refills | Status: DC
  Filled 2021-05-03: qty 135, 90d supply, fill #0

## 2021-05-03 MED ORDER — HYDROXYZINE HCL 10 MG PO TABS
ORAL_TABLET | ORAL | 1 refills | Status: DC
  Filled 2021-05-03: qty 30, 10d supply, fill #0

## 2021-05-08 ENCOUNTER — Other Ambulatory Visit (HOSPITAL_BASED_OUTPATIENT_CLINIC_OR_DEPARTMENT_OTHER): Payer: Self-pay

## 2021-05-08 MED ORDER — PREDNISONE 10 MG PO TABS
ORAL_TABLET | ORAL | 0 refills | Status: DC
  Filled 2021-05-08 (×2): qty 20, 8d supply, fill #0

## 2021-05-28 DIAGNOSIS — E7800 Pure hypercholesterolemia, unspecified: Secondary | ICD-10-CM | POA: Diagnosis not present

## 2021-05-28 DIAGNOSIS — Z131 Encounter for screening for diabetes mellitus: Secondary | ICD-10-CM | POA: Diagnosis not present

## 2021-05-28 LAB — LIPID PANEL
Cholesterol: 247 — AB (ref 0–200)
HDL: 78 — AB (ref 35–70)
LDL Cholesterol: 153
LDl/HDL Ratio: 3.2
Triglycerides: 98 (ref 40–160)

## 2021-05-28 LAB — CBC AND DIFFERENTIAL
HCT: 37 (ref 36–46)
Hemoglobin: 12.1 (ref 12.0–16.0)
Neutrophils Absolute: 3.4
Platelets: 313 (ref 150–399)
WBC: 5.6

## 2021-05-28 LAB — TSH: TSH: 0.99 (ref 0.41–5.90)

## 2021-05-28 LAB — COMPREHENSIVE METABOLIC PANEL
Albumin: 4 (ref 3.5–5.0)
Calcium: 9.5 (ref 8.7–10.7)
GFR calc non Af Amer: 76

## 2021-05-28 LAB — HEPATIC FUNCTION PANEL
ALT: 10 (ref 7–35)
AST: 17 (ref 13–35)
Alkaline Phosphatase: 50 (ref 25–125)
Bilirubin, Total: 0.6

## 2021-05-28 LAB — CBC: RBC: 4.09 (ref 3.87–5.11)

## 2021-05-28 LAB — BASIC METABOLIC PANEL
BUN: 10 (ref 4–21)
CO2: 28 — AB (ref 13–22)
Chloride: 105 (ref 99–108)
Creatinine: 0.9 (ref 0.5–1.1)
Glucose: 85
Potassium: 4.4 (ref 3.4–5.3)
Sodium: 139 (ref 137–147)

## 2021-05-28 LAB — HEMOGLOBIN A1C: Hemoglobin A1C: 5.5

## 2021-06-05 ENCOUNTER — Ambulatory Visit: Payer: 59 | Admitting: Family Medicine

## 2021-06-13 ENCOUNTER — Other Ambulatory Visit (HOSPITAL_BASED_OUTPATIENT_CLINIC_OR_DEPARTMENT_OTHER): Payer: Self-pay

## 2021-06-13 MED ORDER — FLUOXETINE HCL 10 MG PO CAPS
ORAL_CAPSULE | ORAL | 3 refills | Status: DC
  Filled 2021-06-13: qty 135, 90d supply, fill #0
  Filled 2021-10-18: qty 135, 90d supply, fill #1
  Filled 2022-01-31: qty 135, 90d supply, fill #2
  Filled 2022-05-02: qty 135, 90d supply, fill #3

## 2021-06-14 ENCOUNTER — Other Ambulatory Visit (HOSPITAL_BASED_OUTPATIENT_CLINIC_OR_DEPARTMENT_OTHER): Payer: Self-pay

## 2021-06-15 ENCOUNTER — Other Ambulatory Visit (HOSPITAL_BASED_OUTPATIENT_CLINIC_OR_DEPARTMENT_OTHER): Payer: Self-pay

## 2021-06-19 ENCOUNTER — Ambulatory Visit: Payer: Self-pay

## 2021-07-20 ENCOUNTER — Other Ambulatory Visit (INDEPENDENT_AMBULATORY_CARE_PROVIDER_SITE_OTHER): Payer: 59

## 2021-07-20 ENCOUNTER — Encounter: Payer: Self-pay | Admitting: Gastroenterology

## 2021-07-20 ENCOUNTER — Ambulatory Visit (INDEPENDENT_AMBULATORY_CARE_PROVIDER_SITE_OTHER)
Admission: RE | Admit: 2021-07-20 | Discharge: 2021-07-20 | Disposition: A | Discharge status: Home / Self Care | Payer: 59 | Source: Ambulatory Visit | Attending: Gastroenterology | Admitting: Gastroenterology

## 2021-07-20 ENCOUNTER — Ambulatory Visit (INDEPENDENT_AMBULATORY_CARE_PROVIDER_SITE_OTHER): Payer: 59 | Admitting: Gastroenterology

## 2021-07-20 ENCOUNTER — Other Ambulatory Visit: Payer: Self-pay

## 2021-07-20 VITALS — BP 110/70 | HR 72 | Ht 62.25 in | Wt 143.0 lb

## 2021-07-20 DIAGNOSIS — R1013 Epigastric pain: Secondary | ICD-10-CM

## 2021-07-20 DIAGNOSIS — R1012 Left upper quadrant pain: Secondary | ICD-10-CM

## 2021-07-20 DIAGNOSIS — R194 Change in bowel habit: Secondary | ICD-10-CM

## 2021-07-20 DIAGNOSIS — R195 Other fecal abnormalities: Secondary | ICD-10-CM | POA: Diagnosis not present

## 2021-07-20 DIAGNOSIS — R109 Unspecified abdominal pain: Secondary | ICD-10-CM | POA: Diagnosis not present

## 2021-07-20 DIAGNOSIS — R141 Gas pain: Secondary | ICD-10-CM

## 2021-07-20 LAB — CBC
HCT: 36.3 % (ref 36.0–46.0)
Hemoglobin: 11.9 g/dL — ABNORMAL LOW (ref 12.0–15.0)
MCHC: 32.8 g/dL (ref 30.0–36.0)
MCV: 89.2 fl (ref 78.0–100.0)
Platelets: 270 10*3/uL (ref 150.0–400.0)
RBC: 4.08 Mil/uL (ref 3.87–5.11)
RDW: 16.1 % — ABNORMAL HIGH (ref 11.5–15.5)
WBC: 4.7 10*3/uL (ref 4.0–10.5)

## 2021-07-20 LAB — COMPREHENSIVE METABOLIC PANEL
ALT: 9 U/L (ref 0–35)
AST: 15 U/L (ref 0–37)
Albumin: 4.3 g/dL (ref 3.5–5.2)
Alkaline Phosphatase: 43 U/L (ref 39–117)
BUN: 10 mg/dL (ref 6–23)
CO2: 24 mEq/L (ref 19–32)
Calcium: 9.4 mg/dL (ref 8.4–10.5)
Chloride: 102 mEq/L (ref 96–112)
Creatinine, Ser: 0.77 mg/dL (ref 0.40–1.20)
GFR: 85.78 mL/min (ref >60.00)
Glucose, Bld: 91 mg/dL (ref 70–99)
Potassium: 3.7 mEq/L (ref 3.5–5.1)
Sodium: 135 mEq/L (ref 135–145)
Total Bilirubin: 0.4 mg/dL (ref 0.2–1.2)
Total Protein: 7.1 g/dL (ref 6.0–8.3)

## 2021-07-20 LAB — TSH: TSH: 0.83 u[IU]/mL (ref 0.35–5.50)

## 2021-07-20 LAB — SEDIMENTATION RATE: Sed Rate: 13 mm/hr (ref 0–30)

## 2021-07-20 LAB — C-REACTIVE PROTEIN: CRP: <1 mg/dL (ref 0.5–20.0)

## 2021-07-20 NOTE — Progress Notes (Signed)
[facility] VISIT   Primary Care Provider Shirline Frees, MD [address] [place] [address] [phone]  Referring Provider Shirline Frees, MD [address] [place],  [place] [address] [phone]  Patient Profile: Michele [name] is a 55 y.o. female with a pmh significant for HLD, DJD, Anxiety, Glaucoma, reported hiatal hernia.  The patient presents to the [facility] for an evaluation and management of problem(s) noted below:  Problem List 1. Change in bowel habits   2. Mucus in stool   3. Abdominal pain, acute, left upper quadrant   4. Abdominal pain, epigastric   5. Abdominal gas pain     History of Present Illness This is the patient's first visit to the outpatient [facility] clinic.  The patient was previously followed by Dr. Leonie Douglas of [facility] gastroenterology.  She reports a previous colonoscopy approximately 7 or 8 years ago and an upper endoscopy being performed approximately 3 to 4 years ago.  The colonoscopy was for colon cancer screening and she recalls this being normal without evidence of diverticulosis per her report.  The patient remembers the upper endoscopy being performed for further evaluation of atypical chest pain after negative cardiac work-up and she reports that she may have had a hiatal hernia but otherwise no evidence of ulcer disease that she is aware of.  The patient typically has a bowel movement on a daily basis that is noted to be Bristol scale 4.  She has not noted any blood in her stools.  However over the course the last 2 weeks she has had an alteration of her bowel habits.  She has noted an alteration of diarrhea and constipation most recently.  She also has noted some mucus production within her bowels.  No one has been sick around her.  She has not had any URI symptoms.  She denies any new rashes.  No new medications.  There has been increased stresses at home due to  familial situation.  She has had some rectal discomfort and rectal pressure but after passage of a significant amount of gas this morning she is feeling better.  The patient initiated Nexium over the course the last 3 days without significant improvement that she is aware of.  She tried Levsin within the last 24 hours without improvement in her pain.  She is worried as to what this could be.  The patient denies any fevers or chills.  She does not take NSAIDs on a regular basis.  The patient still has regular menstrual cycles and her last period was 3 weeks ago.  GI Review of Systems Positive as above including very minimal bloating Negative for pyrosis, dysphagia, odynophagia, nausea, vomiting, melena, hematochezia  Review of Systems General: Denies fevers/chills/weight loss unintentionally HEENT: Denies oral lesions/sore throat Cardiovascular: Denies chest pain Pulmonary: Denies shortness of breath Gastroenterological: See HPI Genitourinary: Denies darkened urine Hematological: Denies easy bruising/bleeding Endocrine: Denies temperature intolerance Dermatological: Denies jaundice Psychological: Mood is stable   Medications Current Outpatient Medications  Medication Sig Dispense Refill   aspirin EC 81 MG tablet Take 1 tablet by mouth daily.     b complex vitamins tablet Take 1 tablet by mouth daily.     Cholecalciferol (VITAMIN D3) 25 MCG/SPRAY LIQD Take 250 mcg by mouth daily.     COD LIVER OIL PO Take 1 tablet by mouth daily.     FLUoxetine (PROZAC) 10 MG capsule TAKE 1 CAPSULE BY MOUTH ONCE DAILY  ALTERNATING WITH 2 CAPSULES EVERY OTHER DAY 90 days (Patient taking differently: Take 10 mg by mouth every other day.) 135 capsule 3   FLUoxetine (PROZAC) 20 MG tablet Take 20 mg by mouth every other day.     gabapentin (NEURONTIN) 300 MG capsule take 1 capsule by mouth 3 times a day (Patient taking differently: Take 300 mg by mouth at bedtime.) 90 capsule 11   magnesium oxide (MAG-OX) 400 MG  tablet Take 400 mg by mouth daily.     cyclobenzaprine (FLEXERIL) 10 MG tablet TAKE 1 TABLET BY MOUTH AT BEDTIME AS NEEDED ONCE A DAY FOR MUSCLE SPASM (Patient not taking: Reported on 07/20/2021) 30 tablet 1   No current facility-administered medications for this visit.    Allergies Allergies  Allergen Reactions   Lipitor [Atorvastatin Calcium]     Memory issue   Celebrex [Celecoxib] Rash    Histories Past Medical History:  Diagnosis Date   Abdominal bloating    Anemia    Anxiety    Arthritis    Back pain    DDD (degenerative disc disease), cervical    Dorsalgia    Dorsalgia    GERD (gastroesophageal reflux disease)    Glaucoma    Hypercholesterolemia    Hyperlipidemia    Intestinal gas excretion    Peptic ulcer    Past Surgical History:  Procedure Laterality Date   BASAL CELL CARCINOMA EXCISION     BREAST BIOPSY Left 1992   Duct removal (No scar seen)    BREAST LUMPECTOMY Left 1992   COLONOSCOPY     LEFT HEART CATH AND CORONARY ANGIOGRAPHY N/A 01/16/2018   Procedure: LEFT HEART CATH AND CORONARY ANGIOGRAPHY;  Surgeon: Nelva Bush, MD;  Location: [facility];  Service: Cardiovascular;  Laterality: N/A;   UPPER GASTROINTESTINAL ENDOSCOPY     Social History   Socioeconomic History   Marital status: Married    Spouse name: Not on file   Number of children: 2   Years of education: Not on file   Highest education level: Not on file  Occupational History   Occupation: Therapist, sports    Employer: [employer]  Tobacco Use   Smoking status: Never   Smokeless tobacco: Never  Vaping Use   Vaping Use: Never used  Substance and Sexual Activity   Alcohol use: Yes    Alcohol/week: 4.0 standard drinks    Types: 4 Glasses of wine per week    Comment: 1 per day   Drug use: No   Sexual activity: Not on file  Other Topics Concern   Not on file  Social History Narrative   Not on file   Social Determinants of Health   Financial Resource Strain: Not on file  Food  Insecurity: Not on file  Transportation Needs: Not on file  Physical Activity: Not on file  Stress: Not on file  Social Connections: Not on file  Intimate Partner Violence: Not on file   Family History  Problem Relation Age of Onset   Hypertension Mother    Diabetes Mother    Diverticulitis Mother    Diabetes Father    Heart attack Father    Heart attack Sister    Asthma Sister    Alcohol abuse Brother    Stroke Maternal Uncle    Diabetes Maternal Grandfather    Diabetes Paternal Grandmother    Heart disease Paternal Grandfather    Liver disease Other        Paternal great aunt  Breast cancer Neg Hx    Colon cancer Neg Hx    Esophageal cancer Neg Hx    Inflammatory bowel disease Neg Hx    Pancreatic cancer Neg Hx    Rectal cancer Neg Hx    Stomach cancer Neg Hx    I have reviewed her medical, social, and family history in detail and updated the electronic medical record as necessary.    PHYSICAL EXAMINATION  BP 110/70 (BP Location: Left Arm, Patient Position: Sitting, Cuff Size: Normal)   Pulse 72   Ht 5' 2.25" (1.581 m) Comment: height measured without shoes  Wt 143 lb (64.9 kg)   LMP 12/26/2019   BMI 25.95 kg/m  Wt Readings from Last 3 Encounters:  07/20/21 143 lb (64.9 kg)  04/04/21 143 lb (64.9 kg)  02/21/21 144 lb (65.3 kg)  GEN: NAD, appears stated age, doesn't appear chronically ill PSYCH: Cooperative, without pressured speech EYE: Conjunctivae pink, sclerae anicteric ENT: Masked CV: RR without R/Gs  RESP: CTAB posteriorly, without wheezing GI: NABS, soft, minimal tenderness to palpation in the left upper quadrant/midepigastrium, ND, without rebound or guarding, no HSM appreciated GU: DRE deferred MSK/EXT: No lower extremity edema SKIN: No jaundice NEURO:  Alert & Oriented x 3, no focal deficits   REVIEW OF DATA  I reviewed the following data at the time of this encounter:  GI Procedures and Studies  We will work on obtaining prior  EGD/colonoscopy from [facility]  Laboratory Studies  Reviewed those in epic and care everywhere  Imaging Studies  2016 barium esophagram IMPRESSION: Normal study.   ASSESSMENT  Ms. Thumma is a 55 y.o. female with a pmh significant for HLD, DJD, Anxiety, Glaucoma, reported hiatal hernia.  The patient is seen today for evaluation and management of:  1. Change in bowel habits   2. Mucus in stool   3. Abdominal pain, acute, left upper quadrant   4. Abdominal pain, epigastric   5. Abdominal gas pain    The patient is hemodynamically stable.  Clinically, the patient seems to be continuing to demonstrate symptoms that are concerning.  The etiology of her symptoms are not clear.  Query whether patient has dealt with postinfectious ileus with subsequent improvement with gas passage today.  With the patient's symptoms we must also consider de novo IBD.  Pending how the patient does over the course of the coming days will determine whether further imaging and/or endoscopic evaluation may be required.  We will proceed with a KUB and laboratories today.  As long as nothing is overtly concerning for an obstruction, the patient will be planned for monitoring and reevaluation early next week.  We will likely proceed with a cross-sectional CT abdomen/pelvis pending the patient's symptoms unless she is dramatically better.  Underlying functional GI disorder seems less likely although she is dealing with a lot of stress based on her familial situation.  It is not clear that the patient is dealing with upper GI gastritis as an etiology so I will not necessarily continue Nexium at this time.  She would likely needs fiber supplementation as well as oral laxative therapy but we will wait for the KUB.  If the patient has continued issues moving forward, we need to consider strongly a colonoscopic evaluation.  All patient questions were answered to the best of my ability, and the patient agrees to the aforementioned plan of  action with follow-up as indicated.   PLAN  Laboratories as outlined below Proceed with KUB stat read to  ensure no evidence of obstruction Likely CT abdomen/pelvis with IV and oral contrast next week unless patient is significantly better Holding on colonoscopic evaluation for now but will strongly consider based on patient's symptoms persisting or worsening Obtain [facility] endoscopic evaluation over the course of the last 7 to 10 years   Orders Placed This Encounter  Procedures   CT Abdomen Pelvis W Contrast   DG Abd 2 Views   CBC   Comp Met (CMET)   TSH   Sedimentation rate   C-reactive protein    New Prescriptions   No medications on file   Modified Medications   No medications on file    Planned Follow Up No follow-ups on file.   Total Time in Face-to-Face and in Coordination of Care for patient including independent/personal interpretation/review of prior testing, medical history, examination, medication adjustment, communicating results with the patient directly, and documentation with the EHR is 30 minutes.   Justice Britain, MD [facility]oscopy Office # [phone]

## 2021-07-20 NOTE — Patient Instructions (Signed)
Your provider has requested that you go to the basement level for lab work before leaving today. Press "B" on the elevator. The lab is located at the first door on the left as you exit the elevator.  Your provider has requested that you have an abdominal x ray before leaving today. Please go to the basement floor to our Radiology department for the test.  You have been scheduled for a CT scan of the abdomen and pelvis at [facility] ([address]---this is in the same building as [employer]).   You are scheduled on ________________ at ______________________. You should arrive 15 minutes prior to your appointment time for registration. Please follow the written instructions below on the day of your exam:  WARNING: IF YOU ARE ALLERGIC TO IODINE/X-RAY DYE, PLEASE NOTIFY RADIOLOGY IMMEDIATELY AT [phone]! YOU WILL BE GIVEN A 13 HOUR PREMEDICATION PREP.  1) Do not eat or drink anything after _________________________ (4 hours prior to your test) 2) You have been given 2 bottles of oral contrast to drink. The solution may taste better if refrigerated, but do NOT add ice or any other liquid to this solution. Shake well before drinking.    Drink 1 bottle of contrast @ ___________________ (2 hours prior to your exam)  Drink 1 bottle of contrast @ ____________________ (1 hour prior to your exam)  You may take any medications as prescribed with a small amount of water, if necessary. If you take any of the following medications: METFORMIN, GLUCOPHAGE, GLUCOVANCE, AVANDAMET, RIOMET, FORTAMET, [place] MET, JANUMET, GLUMETZA or METAGLIP, you MAY be asked to HOLD this medication 48 hours AFTER the exam.  The purpose of you drinking the oral contrast is to aid in the visualization of your intestinal tract. The contrast solution may cause some diarrhea. Depending on your individual set of symptoms, you may also receive an intravenous injection of x-ray contrast/dye. Plan on being at  [facility] for 30 minutes or longer, depending on the type of exam you are having performed.  This test typically takes 30-45 minutes to complete.  If you have any questions regarding your exam or if you need to reschedule, you may call the CT department at [phone] between the hours of 8:00 am and 5:00 pm, Monday-Friday.  Thank you for choosing me and [facility].  Dr. Rush Landmark   ________________________________________________________________

## 2021-07-21 HISTORY — PX: COLONOSCOPY: SHX174

## 2021-07-21 HISTORY — PX: UPPER GASTROINTESTINAL ENDOSCOPY: SHX188

## 2021-07-22 DIAGNOSIS — R141 Gas pain: Secondary | ICD-10-CM

## 2021-07-22 DIAGNOSIS — R1084 Generalized abdominal pain: Secondary | ICD-10-CM

## 2021-07-23 ENCOUNTER — Other Ambulatory Visit: Payer: Self-pay

## 2021-07-23 DIAGNOSIS — R1012 Left upper quadrant pain: Secondary | ICD-10-CM

## 2021-07-30 ENCOUNTER — Other Ambulatory Visit: Payer: Self-pay

## 2021-07-30 ENCOUNTER — Telehealth: Payer: Self-pay

## 2021-07-30 ENCOUNTER — Encounter: Payer: Self-pay | Admitting: Gastroenterology

## 2021-07-30 DIAGNOSIS — R194 Change in bowel habit: Secondary | ICD-10-CM

## 2021-07-30 NOTE — Telephone Encounter (Signed)
The pt has been scheduled for 10/11 at 1030 am in the [facility].  She was given instructions for miralax prep.  She is on clear liquids today.     Mansouraty, Telford Nab., MD   Physician  Gastroenterology  Telephone Encounter  Signed  Encounter Date:  07/22/2021           Signed               Chong Sicilian, Please reach out to the patient (our nurse from the [facility]) and let her know that I have availability for colonoscopy tomorrow. She is already started clear liquids in the possibility that she should need a colonoscopy. Please try and get her prepped and she can have an afternoon procedure to ensure that she is really clean on Tuesday. No other contraindications from my standpoint.  Can forward to Jenny Reichmann if necessary but I see no reason why she cannot be done in the [facility]. Diagnosis is alteration of bowel habits. Please update me once she is scheduled. Thanks. GM        Electronically signed by Irving Copas., MD at 07/30/2021 10:00 AM Patient Message on 07/22/2021  Patient Message on 07/22/2021   Detailed Report  Additional Documentation  Vitals:  LMP 12/26/2019  Encounter Info:  Billing Info, History, Allergies, Detailed Report

## 2021-07-30 NOTE — Telephone Encounter (Signed)
Patty, Please reach out to the patient (our nurse from the [facility]) and let her know that I have availability for colonoscopy tomorrow. She is already started clear liquids in the possibility that she should need a colonoscopy. Please try and get her prepped and she can have an afternoon procedure to ensure that she is really clean on Tuesday. No other contraindications from my standpoint.  Can forward to Jenny Reichmann if necessary but I see no reason why she cannot be done in the [facility]. Diagnosis is alteration of bowel habits. Please update me once she is scheduled. Thanks. GM

## 2021-07-31 ENCOUNTER — Encounter: Payer: Self-pay | Admitting: Gastroenterology

## 2021-07-31 ENCOUNTER — Other Ambulatory Visit: Payer: Self-pay | Admitting: Gastroenterology

## 2021-07-31 ENCOUNTER — Other Ambulatory Visit: Payer: Self-pay

## 2021-07-31 ENCOUNTER — Ambulatory Visit (AMBULATORY_SURGERY_CENTER): Payer: 59 | Admitting: Gastroenterology

## 2021-07-31 VITALS — BP 119/67 | HR 63 | Temp 97.1°F | Resp 11 | Ht 62.0 in | Wt 143.0 lb

## 2021-07-31 DIAGNOSIS — R194 Change in bowel habit: Secondary | ICD-10-CM

## 2021-07-31 DIAGNOSIS — F419 Anxiety disorder, unspecified: Secondary | ICD-10-CM | POA: Diagnosis not present

## 2021-07-31 DIAGNOSIS — K640 First degree hemorrhoids: Secondary | ICD-10-CM

## 2021-07-31 DIAGNOSIS — E785 Hyperlipidemia, unspecified: Secondary | ICD-10-CM | POA: Diagnosis not present

## 2021-07-31 DIAGNOSIS — R197 Diarrhea, unspecified: Secondary | ICD-10-CM

## 2021-07-31 MED ORDER — SODIUM CHLORIDE 0.9 % IV SOLN: 500.0000 mL | Freq: Once | INTRAVENOUS | Status: DC

## 2021-07-31 NOTE — Progress Notes (Signed)
VS taken by C.W. 

## 2021-07-31 NOTE — Op Note (Signed)
[facility] Patient Name: Michele [name] Procedure Date: 07/31/2021 10:02 AM MRN: [id number] Endoscopist: Justice Britain , MD Age: 55 Referring MD:  Date of Birth: 1966-06-24 Gender: Female Account #: [id number] Procedure:                Colonoscopy Indications:              Abdominal distress, Change in bowel habits, Change                            in stool caliber Medicines:                Monitored Anesthesia Care Procedure:                Pre-Anesthesia Assessment:                           - Prior to the procedure, a History and Physical                            was performed, and patient medications and                            allergies were reviewed. The patient's tolerance of                            previous anesthesia was also reviewed. The risks                            and benefits of the procedure and the sedation                            options and risks were discussed with the patient.                            All questions were answered, and informed consent                            was obtained. Prior Anticoagulants: The patient has                            taken no previous anticoagulant or antiplatelet                            agents except for aspirin. ASA Grade Assessment: II                            - A patient with mild systemic disease. After                            reviewing the risks and benefits, the patient was                            deemed in satisfactory condition to undergo the  procedure.                           After obtaining informed consent, the colonoscope                            was passed under direct vision. Throughout the                            procedure, the patient's blood pressure, pulse, and                            oxygen saturations were monitored continuously. The                            Olympus PCF-[id number] (#[id number]) Colonoscope was                             introduced through the anus and advanced to the 8                            cm into the ileum. The colonoscopy was performed                            without difficulty. The patient tolerated the                            procedure. The quality of the bowel preparation was                            good. The terminal ileum, ileocecal valve,                            appendiceal orifice, and rectum were photographed. Scope In: 86:57:84 AM Scope Out: 10:25:57 AM Scope Withdrawal Time: 0 hours 15 minutes 35 seconds  Total Procedure Duration: 0 hours 19 minutes 53 seconds  Findings:                 The digital rectal exam findings include                            hemorrhoids. Pertinent negatives include no                            palpable rectal lesions.                           The terminal ileum and ileocecal valve appeared                            normal.                           Normal mucosa was found in the entire colon.  Biopsies for histology were taken with a cold                            forceps from the entire colon for evaluation of                            microscopic colitis.                           Non-bleeding non-thrombosed external and internal                            hemorrhoids were found during retroflexion, during                            perianal exam, during digital exam and during                            endoscopy. The hemorrhoids were Grade I (internal                            hemorrhoids that do not prolapse). Complications:            No immediate complications. Estimated Blood Loss:     Estimated blood loss was minimal. Impression:               - Hemorrhoids found on digital rectal exam.                           - The examined portion of the ileum was normal.                           - Normal mucosa in the entire examined colon.                            Biopsied.                           -  Non-bleeding non-thrombosed external and internal                            hemorrhoids. Recommendation:           - The patient will be observed post-procedure,                            until all discharge criteria are met.                           - Discharge patient to home.                           - Patient has a contact number available for                            emergencies. The signs and symptoms of potential  delayed complications were discussed with the                            patient. Return to normal activities tomorrow.                            Written discharge instructions were provided to the                            patient.                           - High fiber diet.                           - Use FiberCon 1-2 tablets PO daily.                           - Would allow patient to remain on Miralax daily as                            needed.                           - Pending patient having persistent symptoms of                            altered bowel habits, although nothing noted today                            in regards to overt extrinsic impression, we can                            consider a CT-AP to ensure nothing else is found to                            cause this alteration.                           - Pending how patient is doing consider Empiric                            Rifaximin therapy vs SIBO breath testing if this is                            post-infectious IBS or SIBO.                           - If patient wants to get follow up micronutrient                            labs they are pended.                           - Repeat colonoscopy in 10 years for screening  purposes.                           - The findings and recommendations were discussed                            with the patient.                           - The findings and recommendations were discussed                             with the patient's family. Justice Britain, MD 07/31/2021 10:37:00 AM

## 2021-07-31 NOTE — Progress Notes (Signed)
GASTROENTEROLOGY PROCEDURE H&P NOTE   Primary Care Physician: Shirline Frees, MD  HPI: Michele [name] is a 55 y.o. female who presents for Colonoscopy for alteration of bowel habits and bloating and discomfort.  Past Medical History:  Diagnosis Date   Abdominal bloating    Anemia    Anxiety    Arthritis    Back pain    DDD (degenerative disc disease), cervical    Dorsalgia    Dorsalgia    Glaucoma    Hypercholesterolemia    Hyperlipidemia    Intestinal gas excretion    Peptic ulcer    Past Surgical History:  Procedure Laterality Date   BASAL CELL CARCINOMA EXCISION     BREAST BIOPSY Left 1992   Duct removal (No scar seen)    BREAST LUMPECTOMY Left 1992   COLONOSCOPY     LEFT HEART CATH AND CORONARY ANGIOGRAPHY N/A 01/16/2018   Procedure: LEFT HEART CATH AND CORONARY ANGIOGRAPHY;  Surgeon: Nelva Bush, MD;  Location: [facility];  Service: Cardiovascular;  Laterality: N/A;   UPPER GASTROINTESTINAL ENDOSCOPY     Current Outpatient Medications  Medication Sig Dispense Refill   aspirin EC 81 MG tablet Take 1 tablet by mouth daily.     b complex vitamins tablet Take 1 tablet by mouth daily.     Cholecalciferol (VITAMIN D3) 25 MCG/SPRAY LIQD Take 250 mcg by mouth daily.     COD LIVER OIL PO Take 1 tablet by mouth daily.     FLUoxetine (PROZAC) 10 MG capsule TAKE 1 CAPSULE BY MOUTH ONCE DAILY ALTERNATING WITH 2 CAPSULES EVERY OTHER DAY 90 days (Patient taking differently: Take 10 mg by mouth every other day.) 135 capsule 3   FLUoxetine (PROZAC) 20 MG tablet Take 20 mg by mouth every other day.     gabapentin (NEURONTIN) 300 MG capsule take 1 capsule by mouth 3 times a day (Patient taking differently: Take 300 mg by mouth at bedtime.) 90 capsule 11   magnesium oxide (MAG-OX) 400 MG tablet Take 400 mg by mouth daily.     cyclobenzaprine (FLEXERIL) 10 MG tablet TAKE 1 TABLET BY MOUTH AT BEDTIME AS NEEDED ONCE A DAY FOR MUSCLE SPASM (Patient not taking: No sig  reported) 30 tablet 1   Current Facility-Administered Medications  Medication Dose Route Frequency Provider Last Rate Last Admin   0.9 %  sodium chloride infusion  500 mL Intravenous Once Mansouraty, Telford Nab., MD        Current Outpatient Medications:    aspirin EC 81 MG tablet, Take 1 tablet by mouth daily., Disp: , Rfl:    b complex vitamins tablet, Take 1 tablet by mouth daily., Disp: , Rfl:    Cholecalciferol (VITAMIN D3) 25 MCG/SPRAY LIQD, Take 250 mcg by mouth daily., Disp: , Rfl:    COD LIVER OIL PO, Take 1 tablet by mouth daily., Disp: , Rfl:    FLUoxetine (PROZAC) 10 MG capsule, TAKE 1 CAPSULE BY MOUTH ONCE DAILY ALTERNATING WITH 2 CAPSULES EVERY OTHER DAY 90 days (Patient taking differently: Take 10 mg by mouth every other day.), Disp: 135 capsule, Rfl: 3   FLUoxetine (PROZAC) 20 MG tablet, Take 20 mg by mouth every other day., Disp: , Rfl:    gabapentin (NEURONTIN) 300 MG capsule, take 1 capsule by mouth 3 times a day (Patient taking differently: Take 300 mg by mouth at bedtime.), Disp: 90 capsule, Rfl: 11   magnesium oxide (MAG-OX) 400 MG tablet, Take 400 mg by  mouth daily., Disp: , Rfl:    cyclobenzaprine (FLEXERIL) 10 MG tablet, TAKE 1 TABLET BY MOUTH AT BEDTIME AS NEEDED ONCE A DAY FOR MUSCLE SPASM (Patient not taking: No sig reported), Disp: 30 tablet, Rfl: 1  Current Facility-Administered Medications:    0.9 %  sodium chloride infusion, 500 mL, Intravenous, Once, Mansouraty, Telford Nab., MD Allergies  Allergen Reactions   Lipitor [Atorvastatin Calcium]     Memory issue   Celebrex [Celecoxib] Rash   Family History  Problem Relation Age of Onset   Hypertension Mother    Diabetes Mother    Diverticulitis Mother    Diabetes Father    Heart attack Father    Heart attack Sister    Asthma Sister    Alcohol abuse Brother    Stroke Maternal Uncle    Diabetes Maternal Grandfather    Diabetes Paternal Grandmother    Heart disease Paternal Grandfather    Liver disease  Other        Paternal great aunt   Breast cancer Neg Hx    Colon cancer Neg Hx    Esophageal cancer Neg Hx    Inflammatory bowel disease Neg Hx    Pancreatic cancer Neg Hx    Rectal cancer Neg Hx    Stomach cancer Neg Hx    Social History   Socioeconomic History   Marital status: Married    Spouse name: Not on file   Number of children: 2   Years of education: Not on file   Highest education level: Not on file  Occupational History   Occupation: Programmer, multimedia:   Tobacco Use   Smoking status: Never   Smokeless tobacco: Never  Vaping Use   Vaping Use: Never used  Substance and Sexual Activity   Alcohol use: Yes    Alcohol/week: 4.0 standard drinks    Types: 4 Glasses of wine per week    Comment: 1 per day   Drug use: No   Sexual activity: Not on file  Other Topics Concern   Not on file  Social History Narrative   Not on file   Social Determinants of Health   Financial Resource Strain: Not on file  Food Insecurity: Not on file  Transportation Needs: Not on file  Physical Activity: Not on file  Stress: Not on file  Social Connections: Not on file  Intimate Partner Violence: Not on file    Physical Exam: Today's Vitals   07/31/21 0952  BP: 113/61  Pulse: 72  Temp: (!) 97.1 F (36.2 C)  SpO2: 99%  Weight: 143 lb (64.9 kg)  Height: 5\' 2"  (1.575 m)   Body mass index is 26.16 kg/m. GEN: NAD EYE: Sclerae anicteric ENT: MMM CV: Non-tachycardic GI: Soft, NT/ND NEURO:  Alert & Oriented x 3  Lab Results: No results for input(s): WBC, HGB, HCT, PLT in the last 72 hours. BMET No results for input(s): NA, K, CL, CO2, GLUCOSE, BUN, CREATININE, CALCIUM in the last 72 hours. LFT No results for input(s): PROT, ALBUMIN, AST, ALT, ALKPHOS, BILITOT, BILIDIR, IBILI in the last 72 hours. PT/INR No results for input(s): LABPROT, INR in the last 72 hours.   Impression / Plan: This is a 55 y.o.female who presents for Colonoscopy for alteration of bowel  habits and bloating and discomfort.  The risks and benefits of endoscopic evaluation/treatment were discussed with the patient and/or family; these include but are not limited to the risk of perforation, infection, bleeding, missed lesions,  lack of diagnosis, severe illness requiring hospitalization, as well as anesthesia and sedation related illnesses.  The patient's history has been reviewed, patient examined, no change in status, and deemed stable for procedure.  The patient and/or family is agreeable to proceed.    Justice Britain, MD [facility]oscopy Office # [phone]

## 2021-07-31 NOTE — Patient Instructions (Addendum)
Toileting tips to help with your constipation - Drink at least 64-80 ounces of water/liquid per day. - Establish a time to try to move your bowels every day.  For many people, this is after a cup of coffee or after a meal such as breakfast. - Sit all of the way back on the toilet keeping your back fairly straight and while sitting up, try to rest the tops of your forearms on your upper thighs.   - Raising your feet with a step stool/squatty potty can be helpful to improve the angle that allows your stool to pass through the rectum. - Relax the rectum feeling it bulge toward the toilet water.  If you feel your rectum raising toward your body, you are contracting rather than relaxing. - Breathe in and slowly exhale. "Belly breath" by expanding your belly towards your belly button. Keep belly expanded as you gently direct pressure down and back to the anus.  A low pitched GRRR sound can assist with increasing intra-abdominal pressure.  - Repeat 3-4 times. If unsuccessful, contract the pelvic floor to restore normal tone and get off the toilet.  Avoid excessive straining. - To reduce excessive wiping by teaching your anus to normally contract, place hands on outer aspect of knees and resist knee movement outward.  Hold 5-10 second then place hands just inside of knees and resist inward movement of knees.  Hold 5 seconds.  Repeat a few times each way.     Handout on hemorrhoids given. Await pathology results from biopsy. High Fiber Diet. Use FiberCon 1-2 tablets daily. Allow patient to remain on Miralax daily as needed. Repeat colonoscopy in 10 years for surveillance.   YOU HAD AN ENDOSCOPIC PROCEDURE TODAY AT [facility]:   Refer to the procedure report that was given to you for any specific questions about what was found during the examination.  If the procedure report does not answer your questions, please call your gastroenterologist to clarify.  If you requested that your care  partner not be given the details of your procedure findings, then the procedure report has been included in a sealed envelope for you to review at your convenience later.  YOU SHOULD EXPECT: Some feelings of bloating in the abdomen. Passage of more gas than usual.  Walking can help get rid of the air that was put into your GI tract during the procedure and reduce the bloating. If you had a lower endoscopy (such as a colonoscopy or flexible sigmoidoscopy) you may notice spotting of blood in your stool or on the toilet paper. If you underwent a bowel prep for your procedure, you may not have a normal bowel movement for a few days.  Please Note:  You might notice some irritation and congestion in your nose or some drainage.  This is from the oxygen used during your procedure.  There is no need for concern and it should clear up in a day or so.  SYMPTOMS TO REPORT IMMEDIATELY:  Following lower endoscopy (colonoscopy or flexible sigmoidoscopy):  Excessive amounts of blood in the stool  Significant tenderness or worsening of abdominal pains  Swelling of the abdomen that is new, acute  Fever of 100F or higher  For urgent or emergent issues, a gastroenterologist can be reached at any hour by calling [phone]. Do not use MyChart messaging for urgent concerns.    DIET:  We do recommend a small meal at first, but then you may proceed to your regular diet.  Drink plenty of fluids but you should avoid alcoholic beverages for 24 hours.  ACTIVITY:  You should plan to take it easy for the rest of today and you should NOT DRIVE or use heavy machinery until tomorrow (because of the sedation medicines used during the test).    FOLLOW UP: Our staff will call the number listed on your records 48-72 hours following your procedure to check on you and address any questions or concerns that you may have regarding the information given to you following your procedure. If we do not reach you, we will leave a  message.  We will attempt to reach you two times.  During this call, we will ask if you have developed any symptoms of COVID 19. If you develop any symptoms (ie: fever, flu-like symptoms, shortness of breath, cough etc.) before then, please call [phone].  If you test positive for Covid 19 in the 2 weeks post procedure, please call and report this information to [place].    If any biopsies were taken you will be contacted by phone or by letter within the next 1-3 weeks.  Please call us at [phone] if you have not heard about the biopsies in 3 weeks.    SIGNATURES/CONFIDENTIALITY: You and/or your care partner have signed paperwork which will be entered into your electronic medical record.  These signatures attest to the fact that that the information above on your After Visit Summary has been reviewed and is understood.  Full responsibility of the confidentiality of this discharge information lies with you and/or your care-partner.

## 2021-07-31 NOTE — Progress Notes (Signed)
Report to PACU, RN, vss, BBS= Clear.  

## 2021-08-02 ENCOUNTER — Other Ambulatory Visit (HOSPITAL_BASED_OUTPATIENT_CLINIC_OR_DEPARTMENT_OTHER): Payer: Self-pay

## 2021-08-02 MED ORDER — SUCRALFATE 1 GM/10ML PO SUSP
1.0000 g | Freq: Three times a day (TID) | ORAL | 2 refills | Status: DC
  Filled 2021-08-02: qty 420, 11d supply, fill #0

## 2021-08-02 NOTE — Telephone Encounter (Signed)
Dr Rush Landmark do you want to order a CT?

## 2021-08-02 NOTE — Addendum Note (Signed)
Addended by: Justice Britain on: 08/02/2021 04:32 AM   Modules accepted: Orders

## 2021-08-02 NOTE — Addendum Note (Signed)
Addended by: Justice Britain on: 08/02/2021 05:03 PM   Modules accepted: Orders

## 2021-08-03 ENCOUNTER — Ambulatory Visit (HOSPITAL_BASED_OUTPATIENT_CLINIC_OR_DEPARTMENT_OTHER)
Admission: RE | Admit: 2021-08-03 | Discharge: 2021-08-03 | Disposition: A | Discharge status: Home / Self Care | Payer: 59 | Source: Ambulatory Visit | Attending: Gastroenterology | Admitting: Gastroenterology

## 2021-08-03 ENCOUNTER — Other Ambulatory Visit: Payer: Self-pay

## 2021-08-03 DIAGNOSIS — R109 Unspecified abdominal pain: Secondary | ICD-10-CM | POA: Diagnosis not present

## 2021-08-03 DIAGNOSIS — R1084 Generalized abdominal pain: Secondary | ICD-10-CM

## 2021-08-03 DIAGNOSIS — R141 Gas pain: Secondary | ICD-10-CM | POA: Diagnosis not present

## 2021-08-03 DIAGNOSIS — R140 Abdominal distension (gaseous): Secondary | ICD-10-CM | POA: Diagnosis not present

## 2021-08-03 MED ORDER — IOHEXOL 300 MG/ML  SOLN
100.0000 mL | Freq: Once | INTRAMUSCULAR | Status: AC | PRN
  Administered 2021-08-03: 100 mL via INTRAVENOUS

## 2021-08-03 NOTE — Telephone Encounter (Signed)
The pt has an appt for today at the [facility] location.  She has been advised

## 2021-08-06 ENCOUNTER — Other Ambulatory Visit: Payer: Self-pay

## 2021-08-06 ENCOUNTER — Ambulatory Visit (INDEPENDENT_AMBULATORY_CARE_PROVIDER_SITE_OTHER): Payer: 59 | Admitting: Sports Medicine

## 2021-08-06 ENCOUNTER — Other Ambulatory Visit (HOSPITAL_BASED_OUTPATIENT_CLINIC_OR_DEPARTMENT_OTHER): Payer: Self-pay

## 2021-08-06 VITALS — BP 100/70 | HR 74 | Ht 62.0 in

## 2021-08-06 DIAGNOSIS — M5432 Sciatica, left side: Secondary | ICD-10-CM

## 2021-08-06 DIAGNOSIS — G5702 Lesion of sciatic nerve, left lower limb: Secondary | ICD-10-CM

## 2021-08-06 MED ORDER — CYCLOBENZAPRINE HCL 10 MG PO TABS
10.0000 mg | ORAL_TABLET | Freq: Three times a day (TID) | ORAL | 0 refills | Status: DC
  Filled 2021-08-06: qty 30, 10d supply, fill #0

## 2021-08-06 MED ORDER — METHYLPREDNISOLONE ACETATE 80 MG/ML IJ SUSP
80.0000 mg | Freq: Once | INTRAMUSCULAR | Status: AC
  Administered 2021-08-06: 80 mg via INTRAMUSCULAR

## 2021-08-06 MED ORDER — KETOROLAC TROMETHAMINE 60 MG/2ML IM SOLN
60.0000 mg | Freq: Once | INTRAMUSCULAR | Status: AC
  Administered 2021-08-06: 60 mg via INTRAMUSCULAR

## 2021-08-06 MED ORDER — MELOXICAM 15 MG PO TABS
15.0000 mg | ORAL_TABLET | Freq: Every day | ORAL | 0 refills | Status: DC
  Filled 2021-08-06: qty 14, 14d supply, fill #0

## 2021-08-06 NOTE — Progress Notes (Signed)
Michele [name] D.[place] Phone: [phone]   Assessment and Plan:     1. Sciatica of left side 2. Piriformis syndrome of left side -Acute, uncertain prognosis, initial sports medicine visit - Likely severe sciatica pain from left-sided piriformis syndrome based on HPI, physical exam, recent CT and x-ray - Reviewed patient's CT and x-ray obtained on 08/03/2021.  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 - It is possible that disc bulge is the cause of patient's sciatica pain, however with patient's pain being aggravated by sitting on that side, and disc bulge appearing only mild 3 days ago, I feel it is more likely that piriformis is the aggravating source - Start Flexeril 10 mg 3 times daily for the next 1 week - Continue gabapentin 600 mg every 6 hours - Start meloxicam 15 mg daily for 1 week. - Depo-Medrol/Toradol injection given at today's visit due to severity of patient's pain - Advised patient that using these multiple medications may make her dizzy, altered.  Do not recommend driving. -Out of work for 1 week until reevaluation.  Work note provided  Pertinent previous records reviewed include last GI note, CT abdomen/pelvis from 08/03/2021, x-ray obtained on 08/03/2021   Follow Up: In 1 week for reevaluation   Subjective:   I, Michele [name], am serving as a scribe for [name]  Chief Complaint: Low back pain   HPI:   08/06/21 Patient is a 55 year old female presenting with low back pain. Patient was last seen by Dr. Tamala Julian on 04/04/21 for upper back and neck pain. Today patient states that her low back pain started over the weekend Sunday patient had a hard time getting out of the car. Patient is not aware of anything that caused the pain but states she does have history of bulging disk in L4-L5. Patient cannot stand or even sit today without any  pain. Patient locates pain to the Left upper middle buttock and has pain in her upper thigh. Patient describes pain as sharp an shooting.  Numbness/tingling: no Weakness:yes  Aggravates: sitting, standing, walking, laying. Hard to move Treatments tried: flexeril, gabapentin, ice and heat    Relevant Historical Information: Patient is currently undergoing work-up for abdominal discomfort, intermittent constipation, belching, acid reflux.  Additional pertinent review of systems negative.   Current Outpatient Medications:    aspirin EC 81 MG tablet, Take 1 tablet by mouth daily., Disp: , Rfl:    b complex vitamins tablet, Take 1 tablet by mouth daily., Disp: , Rfl:    Cholecalciferol (VITAMIN D3) 25 MCG/SPRAY LIQD, Take 250 mcg by mouth daily., Disp: , Rfl:    COD LIVER OIL PO, Take 1 tablet by mouth daily., Disp: , Rfl:    FLUoxetine (PROZAC) 10 MG capsule, TAKE 1 CAPSULE BY MOUTH ONCE DAILY ALTERNATING WITH 2 CAPSULES EVERY OTHER DAY 90 days (Patient taking differently: Take 10 mg by mouth every other day.), Disp: 135 capsule, Rfl: 3   FLUoxetine (PROZAC) 20 MG tablet, Take 20 mg by mouth every other day., Disp: , Rfl:    gabapentin (NEURONTIN) 300 MG capsule, take 1 capsule by mouth 3 times a day (Patient taking differently: Take 300 mg by mouth at bedtime.), Disp: 90 capsule, Rfl: 11   magnesium oxide (MAG-OX) 400 MG tablet, Take 400 mg by mouth daily., Disp: , Rfl:    meloxicam (MOBIC) 15 MG tablet, Take  1 tablet (15 mg total) by mouth daily., Disp: 14 tablet, Rfl: 0   sucralfate (CARAFATE) 1 GM/10ML suspension, Take 10 mLs (1 g total) by mouth 4 (four) times daily -  with meals and at bedtime., Disp: 420 mL, Rfl: 2   cyclobenzaprine (FLEXERIL) 10 MG tablet, Take 1 tablet (10 mg total) by mouth 3 (three) times daily., Disp: 30 tablet, Rfl: 0   Objective:     Vitals:   08/06/21 1041  BP: 100/70  Pulse: 74  SpO2: 98%  Height: 5\' 2"  (1.575 m)      Body mass index is 26.16 kg/m.     Physical Exam:    General: awake, alert, and oriented.  Appears to be in moderate acute distress, tearing due to pain Skin: no suspicious lesions or rashes Neuro:sensation intact distally with no dificits, normal muscle tone, no atrophy, strength 5/5 in all tested lower ext groups, although significant posterior hip pain with hip flexion Psych: normal mood and affect, speech clear  Left hip: No deformity, swelling or wasting ROM Fexion 90, ext 30, IR 45, ER 45 TTP gluteal musculature NTTP over the hip flexors, adductors, inguinal canal, pubic ramus, greater troch, si joint, lumbar spine Negative log roll with FROM  Gait: Using wheelchair due to discomfort   Electronically signed by:  Michele [name] D.[name] 11:30 AM 08/06/21

## 2021-08-06 NOTE — Patient Instructions (Addendum)
Good to see you  Injections given today do not start meloxicam till tomorrow Note given to be out one week or till re-evaluated Meloxicam 15mg  daily  Can take gabapentin 600mg  every 6 hours  Flexeril 10mg  tablets 3 times a day  Follow up in 1 weeks

## 2021-08-07 ENCOUNTER — Other Ambulatory Visit: Payer: Self-pay

## 2021-08-07 DIAGNOSIS — R1084 Generalized abdominal pain: Secondary | ICD-10-CM

## 2021-08-07 NOTE — Telephone Encounter (Signed)
This should serve as documentation. Patient has undergone a colonoscopy which was unremarkable and pathology negative for microscopic/collagenous colitis. Patient since her procedure has had progressive abdominal pain now manifested as pain in the upper abdomen. She has had progressive postprandial abdominal pain. PPI has not been effective. Carafate has not been effective. In the setting of her pain while she eats she has become concerned about her ability to eat and has developed some anorexia. As such, upper endoscopy is now indicated to further evaluate for other potential etiologies for her symptoms. If the upper endoscopy is unremarkable and she has no continued improvement with PPI and Carafate therapy then will need to consider functional abdominal pain but we need to rule out H. pylori gastritis as well as eosinophilic gastroenteritis that can be performed via endoscopy and biopsy.  Patty, please reach out to the patient and see if she will be available for an upper endoscopy, next available with me (I have availability this Friday before my hospital week next week). Thanks. GM

## 2021-08-08 ENCOUNTER — Encounter: Payer: Self-pay | Admitting: Gastroenterology

## 2021-08-10 ENCOUNTER — Other Ambulatory Visit: Payer: 59

## 2021-08-10 ENCOUNTER — Other Ambulatory Visit (INDEPENDENT_AMBULATORY_CARE_PROVIDER_SITE_OTHER): Payer: 59

## 2021-08-10 ENCOUNTER — Other Ambulatory Visit: Payer: Self-pay

## 2021-08-10 ENCOUNTER — Other Ambulatory Visit (HOSPITAL_BASED_OUTPATIENT_CLINIC_OR_DEPARTMENT_OTHER): Payer: Self-pay

## 2021-08-10 ENCOUNTER — Ambulatory Visit (AMBULATORY_SURGERY_CENTER): Payer: 59 | Admitting: Gastroenterology

## 2021-08-10 ENCOUNTER — Encounter: Payer: Self-pay | Admitting: Gastroenterology

## 2021-08-10 VITALS — BP 102/57 | HR 68 | Temp 96.8°F | Resp 12 | Ht 62.0 in | Wt 143.0 lb

## 2021-08-10 DIAGNOSIS — R1012 Left upper quadrant pain: Secondary | ICD-10-CM

## 2021-08-10 DIAGNOSIS — R194 Change in bowel habit: Secondary | ICD-10-CM | POA: Diagnosis not present

## 2021-08-10 DIAGNOSIS — R100 Acute abdomen: Secondary | ICD-10-CM | POA: Diagnosis not present

## 2021-08-10 DIAGNOSIS — R140 Abdominal distension (gaseous): Secondary | ICD-10-CM

## 2021-08-10 DIAGNOSIS — F419 Anxiety disorder, unspecified: Secondary | ICD-10-CM | POA: Diagnosis not present

## 2021-08-10 DIAGNOSIS — K640 First degree hemorrhoids: Secondary | ICD-10-CM

## 2021-08-10 DIAGNOSIS — R1084 Generalized abdominal pain: Secondary | ICD-10-CM

## 2021-08-10 LAB — IBC + FERRITIN
Ferritin: 5.3 ng/mL — ABNORMAL LOW (ref 10.0–291.0)
Iron: 70 ug/dL (ref 42–145)
Saturation Ratios: 19.8 % — ABNORMAL LOW (ref 20.0–50.0)
TIBC: 352.8 ug/dL (ref 250.0–450.0)
Transferrin: 252 mg/dL (ref 212.0–360.0)

## 2021-08-10 LAB — FOLATE: Folate: 6.8 ng/mL (ref >5.9)

## 2021-08-10 LAB — VITAMIN B12: Vitamin B-12: >1550 pg/mL — ABNORMAL HIGH (ref 211–911)

## 2021-08-10 MED ORDER — FERROUS GLUCONATE 324 (38 FE) MG PO TABS
324.0000 mg | ORAL_TABLET | Freq: Every day | ORAL | 3 refills | Status: DC
  Filled 2021-08-10: qty 30, 30d supply, fill #0

## 2021-08-10 MED ORDER — SODIUM CHLORIDE 0.9 % IV SOLN: 500.0000 mL | Freq: Once | INTRAVENOUS | Status: DC

## 2021-08-10 MED ORDER — DESIPRAMINE HCL 25 MG PO TABS
25.0000 mg | ORAL_TABLET | Freq: Every day | ORAL | 1 refills | Status: DC
  Filled 2021-08-10 – 2021-08-20 (×2): qty 30, 30d supply, fill #0

## 2021-08-10 NOTE — Progress Notes (Signed)
Pt Drowsy. VSS. To PACU, report to RN. No anesthetic complications noted.  

## 2021-08-10 NOTE — Op Note (Signed)
[facility] Patient Name: Michele [name] Procedure Date: 08/10/2021 7:33 AM MRN: [id number] Endoscopist: Justice Britain , MD Age: 55 Referring MD:  Date of Birth: 06/11/1966 Gender: Female Account #: [id number] Procedure:                Upper GI endoscopy Indications:              Epigastric abdominal pain, Lower abdominal pain,                            Upper abdominal pain, Anorexia, Abdominal bloating Medicines:                Monitored Anesthesia Care Procedure:                Pre-Anesthesia Assessment:                           - Prior to the procedure, a History and Physical                            was performed, and patient medications and                            allergies were reviewed. The patient's tolerance of                            previous anesthesia was also reviewed. The risks                            and benefits of the procedure and the sedation                            options and risks were discussed with the patient.                            All questions were answered, and informed consent                            was obtained. Prior Anticoagulants: The patient has                            taken no previous anticoagulant or antiplatelet                            agents. ASA Grade Assessment: II - A patient with                            mild systemic disease. After reviewing the risks                            and benefits, the patient was deemed in                            satisfactory condition to undergo the procedure.  After obtaining informed consent, the endoscope was                            passed under direct vision. Throughout the                            procedure, the patient's blood pressure, pulse, and                            oxygen saturations were monitored continuously. The                            Endoscope was introduced through the mouth, and                             advanced to the third part of duodenum. The upper                            GI endoscopy was accomplished without difficulty.                            The patient tolerated the procedure. Scope In: Scope Out: Findings:                 No gross lesions were noted in the entire                            esophagus. Biopsies were taken with a cold forceps                            for histology to rule out EoE/LoE.                           The Z-line was regular and was found 36 cm from the                            incisors.                           Patchy mildly erythematous mucosa without bleeding                            was found in the gastric antrum.                           No other gross lesions were noted in the entire                            examined stomach. Biopsies were taken with a cold                            forceps for histology and Helicobacter pylori  testing.                           No gross lesions were noted in the duodenal bulb,                            in the first portion of the duodenum and in the                            second portion of the duodenum. Biopsies were taken                            with a cold forceps for histology to rule out                            enteropathies and Celiac. Complications:            No immediate complications. Estimated Blood Loss:     Estimated blood loss was minimal. Impression:               - No gross lesions in esophagus. Biopsied. Z-line                            regular, 36 cm from the incisors.                           - Erythematous mucosa in the antrum. No other gross                            lesions in the stomach. Biopsied.                           - No gross lesions in the duodenal bulb, in the                            first portion of the duodenum and in the second                            portion of the duodenum. Biopsied. Recommendation:           -  The patient will be observed post-procedure,                            until all discharge criteria are met.                           - Discharge patient to home.                           - Patient has a contact number available for                            emergencies. The signs and symptoms of potential  delayed complications were discussed with the                            patient. Return to normal activities tomorrow.                            Written discharge instructions were provided to the                            patient.                           - Resume previous diet.                           - Continue present medications.                           - Initiate Desipramine at 12.5 mg (1/2 tablet of                            25) nightly for next 2-weeks. If tolerating will                            increase up to 25 mg and maintain with potential                            increase higher (though would need to discuss with                            PCP if much higher since already on Fluoxetine). If                            not tolerating due to sleepiness then may stop.                           - Will consider likely Rifaximin (2-week treatment)                            empiric treatment for symptoms of                            bloating/abdominal pain in setting of possible                            IBS/Functional abdominal pain/SIBO.                           - Recommend Fecal Elastase testing to ensure no                            evidence of Exocrine Pancreatic Insufficiency                            (patient can pick up stool kit  in basement today).                           - The findings and recommendations were discussed                            with the patient.                           - The findings and recommendations were discussed                            with the patient's family. Justice Britain,  MD 08/10/2021 8:38:48 AM

## 2021-08-10 NOTE — Progress Notes (Signed)
GASTROENTEROLOGY PROCEDURE H&P NOTE   Primary Care Physician: Shirline Frees, MD  HPI: Michele [name] is a 55 y.o. female who presents for EGD for abdominal pain, anorexia, progressive pain s/p Colonoscopy and CT scan.  Past Medical History:  Diagnosis Date   Abdominal bloating    Anemia    Anxiety    Arthritis    Back pain    DDD (degenerative disc disease), cervical    Dorsalgia    Dorsalgia    Glaucoma    Hypercholesterolemia    Hyperlipidemia    Intestinal gas excretion    Peptic ulcer    Past Surgical History:  Procedure Laterality Date   BASAL CELL CARCINOMA EXCISION     BREAST BIOPSY Left 1992   Duct removal (No scar seen)    BREAST LUMPECTOMY Left 1992   COLONOSCOPY     LEFT HEART CATH AND CORONARY ANGIOGRAPHY N/A 01/16/2018   Procedure: LEFT HEART CATH AND CORONARY ANGIOGRAPHY;  Surgeon: Nelva Bush, MD;  Location: [facility];  Service: Cardiovascular;  Laterality: N/A;   UPPER GASTROINTESTINAL ENDOSCOPY     Current Outpatient Medications  Medication Sig Dispense Refill   aspirin EC 81 MG tablet Take 1 tablet by mouth daily.     b complex vitamins tablet Take 1 tablet by mouth daily.     Cholecalciferol (VITAMIN D3) 25 MCG/SPRAY LIQD Take 250 mcg by mouth daily.     COD LIVER OIL PO Take 1 tablet by mouth daily.     cyclobenzaprine (FLEXERIL) 10 MG tablet Take 1 tablet (10 mg total) by mouth 3 (three) times daily. 30 tablet 0   FLUoxetine (PROZAC) 10 MG capsule TAKE 1 CAPSULE BY MOUTH ONCE DAILY ALTERNATING WITH 2 CAPSULES EVERY OTHER DAY 90 days (Patient taking differently: Take 10 mg by mouth every other day.) 135 capsule 3   FLUoxetine (PROZAC) 20 MG tablet Take 20 mg by mouth every other day.     gabapentin (NEURONTIN) 300 MG capsule take 1 capsule by mouth 3 times a day (Patient taking differently: Take 300 mg by mouth at bedtime.) 90 capsule 11   magnesium oxide (MAG-OX) 400 MG tablet Take 400 mg by mouth daily.     meloxicam (MOBIC) 15  MG tablet Take 1 tablet (15 mg total) by mouth daily. 14 tablet 0   sucralfate (CARAFATE) 1 GM/10ML suspension Take 10 mLs (1 g total) by mouth 4 (four) times daily -  with meals and at bedtime. 420 mL 2   Current Facility-Administered Medications  Medication Dose Route Frequency Provider Last Rate Last Admin   0.9 %  sodium chloride infusion  500 mL Intravenous Once Mansouraty, Telford Nab., MD        Current Outpatient Medications:    aspirin EC 81 MG tablet, Take 1 tablet by mouth daily., Disp: , Rfl:    b complex vitamins tablet, Take 1 tablet by mouth daily., Disp: , Rfl:    Cholecalciferol (VITAMIN D3) 25 MCG/SPRAY LIQD, Take 250 mcg by mouth daily., Disp: , Rfl:    COD LIVER OIL PO, Take 1 tablet by mouth daily., Disp: , Rfl:    cyclobenzaprine (FLEXERIL) 10 MG tablet, Take 1 tablet (10 mg total) by mouth 3 (three) times daily., Disp: 30 tablet, Rfl: 0   FLUoxetine (PROZAC) 10 MG capsule, TAKE 1 CAPSULE BY MOUTH ONCE DAILY ALTERNATING WITH 2 CAPSULES EVERY OTHER DAY 90 days (Patient taking differently: Take 10 mg by mouth every other day.), Disp: 135  capsule, Rfl: 3   FLUoxetine (PROZAC) 20 MG tablet, Take 20 mg by mouth every other day., Disp: , Rfl:    gabapentin (NEURONTIN) 300 MG capsule, take 1 capsule by mouth 3 times a day (Patient taking differently: Take 300 mg by mouth at bedtime.), Disp: 90 capsule, Rfl: 11   magnesium oxide (MAG-OX) 400 MG tablet, Take 400 mg by mouth daily., Disp: , Rfl:    meloxicam (MOBIC) 15 MG tablet, Take 1 tablet (15 mg total) by mouth daily., Disp: 14 tablet, Rfl: 0   sucralfate (CARAFATE) 1 GM/10ML suspension, Take 10 mLs (1 g total) by mouth 4 (four) times daily -  with meals and at bedtime., Disp: 420 mL, Rfl: 2  Current Facility-Administered Medications:    0.9 %  sodium chloride infusion, 500 mL, Intravenous, Once, Mansouraty, Telford Nab., MD Allergies  Allergen Reactions   Lipitor [Atorvastatin Calcium]     Memory issue   Celebrex  [Celecoxib] Rash   Family History  Problem Relation Age of Onset   Hypertension Mother    Diabetes Mother    Diverticulitis Mother    Diabetes Father    Heart attack Father    Heart attack Sister    Asthma Sister    Alcohol abuse Brother    Stroke Maternal Uncle    Diabetes Maternal Grandfather    Diabetes Paternal Grandmother    Heart disease Paternal Grandfather    Liver disease Other        Paternal great aunt   Breast cancer Neg Hx    Colon cancer Neg Hx    Esophageal cancer Neg Hx    Inflammatory bowel disease Neg Hx    Pancreatic cancer Neg Hx    Rectal cancer Neg Hx    Stomach cancer Neg Hx    Social History   Socioeconomic History   Marital status: Married    Spouse name: Not on file   Number of children: 2   Years of education: Not on file   Highest education level: Not on file  Occupational History   Occupation: Programmer, multimedia: [employer]  Tobacco Use   Smoking status: Never   Smokeless tobacco: Never  Vaping Use   Vaping Use: Never used  Substance and Sexual Activity   Alcohol use: Yes    Alcohol/week: 4.0 standard drinks    Types: 4 Glasses of wine per week    Comment: 1 per day   Drug use: No   Sexual activity: Not on file  Other Topics Concern   Not on file  Social History Narrative   Not on file   Social Determinants of Health   Financial Resource Strain: Not on file  Food Insecurity: Not on file  Transportation Needs: Not on file  Physical Activity: Not on file  Stress: Not on file  Social Connections: Not on file  Intimate Partner Violence: Not on file    Physical Exam: Today's Vitals   08/10/21 0750 08/10/21 0752  BP: (!) 98/58   Pulse: 97   Temp: (!) 96.8 F (36 C) (!) 96.8 F (36 C)  TempSrc: Temporal   SpO2: 98%   Weight: 143 lb (64.9 kg)   Height: 5\' 2"  (1.575 m)   PainSc:  6    Body mass index is 26.16 kg/m. GEN: NAD EYE: Sclerae anicteric ENT: MMM CV: Non-tachycardic GI: Soft, NT/ND NEURO:  Alert & Oriented  x 3  Lab Results: No results for input(s): WBC, HGB, HCT, PLT  in the last 72 hours. BMET No results for input(s): NA, K, CL, CO2, GLUCOSE, BUN, CREATININE, CALCIUM in the last 72 hours. LFT No results for input(s): PROT, ALBUMIN, AST, ALT, ALKPHOS, BILITOT, BILIDIR, IBILI in the last 72 hours. PT/INR No results for input(s): LABPROT, INR in the last 72 hours.   Impression / Plan: This is a 55 y.o.female who presents for EGD for abdominal pain, anorexia, progressive pain s/p Colonoscopy and CT scan.  The risks and benefits of endoscopic evaluation/treatment were discussed with the patient and/or family; these include but are not limited to the risk of perforation, infection, bleeding, missed lesions, lack of diagnosis, severe illness requiring hospitalization, as well as anesthesia and sedation related illnesses.  The patient's history has been reviewed, patient examined, no change in status, and deemed stable for procedure.  The patient and/or family is agreeable to proceed.    Justice Britain, MD [facility]oscopy Office # [phone]

## 2021-08-10 NOTE — Patient Instructions (Addendum)
Handout on gastritis given. Await biopsy results. Go to basement on way out today to pick up Fecal Elastase Testing Kit.  [facility] - will take 1/2 tablet nightly for 2 weeks. If tolerating will increase to 25 mg and maintain with potential increase higher possibly. If not tolerating due to sleepiness then may stop.  Will consider likely Rifaximin (2-week treatment) empiric treatment for symptoms of bloating/abdominal pain in setting of possible IBS/Functional abdominal pain/SIBO.   YOU HAD AN ENDOSCOPIC PROCEDURE TODAY AT [facility]   Refer to the procedure report that was given to you for any specific questions about what was found during the examination.  If the procedure report does not answer your questions, please call your gastroenterologist to clarify.  If you requested that your care partner not be given the details of your procedure findings, then the procedure report has been included in a sealed envelope for you to review at your convenience later.  YOU SHOULD EXPECT: Some feelings of bloating in the abdomen. Passage of more gas than usual.  Walking can help get rid of the air that was put into your GI tract during the procedure and reduce the bloating. If you had a lower endoscopy (such as a colonoscopy or flexible sigmoidoscopy) you may notice spotting of blood in your stool or on the toilet paper. If you underwent a bowel prep for your procedure, you may not have a normal bowel movement for a few days.  Please Note:  You might notice some irritation and congestion in your nose or some drainage.  This is from the oxygen used during your procedure.  There is no need for concern and it should clear up in a day or so.  SYMPTOMS TO REPORT IMMEDIATELY:  Following upper endoscopy (EGD)  Vomiting of blood or coffee ground material  New chest pain or pain under the shoulder blades  Painful or persistently  difficult swallowing  New shortness of breath  Fever of 100F or higher  Black, tarry-looking stools  For urgent or emergent issues, a gastroenterologist can be reached at any hour by calling ([phone] Do not use MyChart messaging for urgent concerns.    DIET:  We do recommend a small meal at first, but then you may proceed to your regular diet.  Drink plenty of fluids but you should avoid alcoholic beverages for 24 hours.  ACTIVITY:  You should plan to take it easy for the rest of today and you should NOT DRIVE or use heavy machinery until tomorrow (because of the sedation medicines used during the test).    FOLLOW UP: Our staff will call the number listed on your records 48-72 hours following your procedure to check on you and address any questions or concerns that you may have regarding the information given to you following your procedure. If we do not reach you, we will leave a message.  We will attempt to reach you two times.  During this call, we will ask if you have developed any symptoms of COVID 19. If you develop any symptoms (ie: fever, flu-like symptoms, shortness of breath, cough etc.) before then, please call ([phone]  If you test positive for Covid 19 in the 2 weeks post procedure, please call and report this information to [facility]    If any biopsies were taken you will be contacted by phone or by letter within the next 1-3 weeks.  Please call [facility] ([phone]  if you have not heard about the biopsies in 3 weeks.    SIGNATURES/CONFIDENTIALITY: You and/or your care partner have signed paperwork which will be entered into your electronic medical record.  These signatures attest to the fact that that the information above on your After Visit Summary has been reviewed and is understood.  Full responsibility of the confidentiality of this discharge information lies with you and/or your care-partner.

## 2021-08-10 NOTE — Progress Notes (Signed)
Michele [name] D.[facility] [place] Phone: [phone]   Assessment and Plan:     1. Piriformis syndrome of left side 2. Constipation, unspecified constipation type 3. Acute bilateral low back pain with left-sided sciatica -Acute, improving, subsequent sports medicine visit - Nearly resolved back pain with radicular symptoms after using Flexeril and meloxicam, however there was a acute recurrent strain causing symptoms to return, albeit at a lesser degree - Continue meloxicam for additional 3 days and then discontinue - Continue to use Flexeril as needed for muscle spasms - Start HEP geared at low back and piriformis stretching  - Of note, patient was seen to have large stool burden on CT around the time that she was having significant low back pain.  Patient had large bowel movement over the weekend that coincided with when she began to feel pain relief.  It is possible that patient's constipation was contributing as a trigger to her low back pain   Pertinent previous records reviewed include CT abdomen from 08/03/2021, EGD note, GI note   Follow Up: As needed if no improvement or worsening symptoms at 4 weeks.  Could consider formal PT at that time   Subjective:    Chief Complaint: Low back pain follow up   HPI:   08/06/21 Patient is a 55 year old female presenting with low back pain. Patient was last seen by Dr. Tamala Julian on 04/04/21 for upper back and neck pain. Today patient states that her low back pain started over the weekend Sunday patient had a hard time getting out of the car. Patient is not aware of anything that caused the pain but states she does have history of bulging disk in L4-L5. Patient cannot stand or even sit today without any pain. Patient locates pain to the Left upper middle buttock and has pain in her upper thigh. Patient describes pain as sharp an shooting.   Numbness/tingling: no Weakness:yes   Aggravates: sitting, standing, walking, laying. Hard to move Treatments tried: flexeril, gabapentin, ice and heat     10 /24/22 Patient states that she is doing a lot better. States that before this weekend she was in no pain good. Patient this weekend helped a neighbor with a washer dryer hookup and there was a huge water mess and since cleaning that up she has hurt her back again. Patient does not have the pain down her left leg or side at all it is just in her lower left side.   Relevant Historical Information:-Continued work-up for abdominal discomfort, intermittent constipation and reflux  Additional pertinent review of systems negative.   Current Outpatient Medications:    aspirin EC 81 MG tablet, Take 1 tablet by mouth daily., Disp: , Rfl:    b complex vitamins tablet, Take 1 tablet by mouth daily., Disp: , Rfl:    Cholecalciferol (VITAMIN D3) 25 MCG/SPRAY LIQD, Take 250 mcg by mouth daily., Disp: , Rfl:    COD LIVER OIL PO, Take 1 tablet by mouth daily., Disp: , Rfl:    cyclobenzaprine (FLEXERIL) 10 MG tablet, Take 1 tablet (10 mg total) by mouth 3 (three) times daily., Disp: 30 tablet, Rfl: 0   desipramine (NORPRAMIN) 25 MG tablet, Take 1 tablet (25 mg total) by mouth daily., Disp: 30 tablet, Rfl: 1   ferrous gluconate (FERGON) 324 MG tablet, Take 1 tablet (324 mg total) by mouth daily with breakfast., Disp: 30 tablet, Rfl: 3   FLUoxetine (PROZAC) 10 MG  capsule, TAKE 1 CAPSULE BY MOUTH ONCE DAILY ALTERNATING WITH 2 CAPSULES EVERY OTHER DAY 90 days (Patient taking differently: Take 10 mg by mouth every other day.), Disp: 135 capsule, Rfl: 3   FLUoxetine (PROZAC) 20 MG tablet, Take 20 mg by mouth every other day., Disp: , Rfl:    gabapentin (NEURONTIN) 300 MG capsule, take 1 capsule by mouth 3 times a day (Patient taking differently: Take 300 mg by mouth at bedtime.), Disp: 90 capsule, Rfl: 11   magnesium oxide (MAG-OX) 400 MG tablet, Take 400 mg by mouth daily., Disp: , Rfl:     meloxicam (MOBIC) 15 MG tablet, Take 1 tablet (15 mg total) by mouth daily., Disp: 14 tablet, Rfl: 0   sucralfate (CARAFATE) 1 GM/10ML suspension, Take 10 mLs (1 g total) by mouth 4 (four) times daily -  with meals and at bedtime., Disp: 420 mL, Rfl: 2   Objective:     Vitals:   08/13/21 1345  BP: 100/60  Pulse: 94  SpO2: 96%  Height: 5\' 2"  (1.575 m)      Body mass index is 26.16 kg/m.    Physical Exam:    General: awake, alert, and oriented no acute distress, nontoxic Skin: no suspicious lesions or rashes Neuro:sensation intact distally with no dificits, normal muscle tone, no atrophy, strength 5/5 in all tested lower ext groups Psych: normal mood and affect, speech clear  H left ip: No deformity, swelling or wasting ROM Fexion 90, ext 30, IR 45, ER 45 TTP mildly to gluteal musculature NTTP over the hip flexors, adductors, inguinal canal, pubic ramus, greater troch, si joint, lumbar spine Negative log roll with FROM Negative FABER Negative FADIR Positive piriformis test Negative trendelenberg Gait slowed favoring right leg   Electronically signed by:  Michele [name] D.[facility] 2:55 PM 08/13/21

## 2021-08-10 NOTE — Progress Notes (Signed)
Vs by DT.

## 2021-08-13 ENCOUNTER — Other Ambulatory Visit: Payer: Self-pay

## 2021-08-13 ENCOUNTER — Other Ambulatory Visit: Payer: 59

## 2021-08-13 ENCOUNTER — Other Ambulatory Visit (HOSPITAL_BASED_OUTPATIENT_CLINIC_OR_DEPARTMENT_OTHER): Payer: Self-pay

## 2021-08-13 ENCOUNTER — Ambulatory Visit (INDEPENDENT_AMBULATORY_CARE_PROVIDER_SITE_OTHER): Payer: 59 | Admitting: Sports Medicine

## 2021-08-13 VITALS — BP 100/60 | HR 94 | Ht 62.0 in

## 2021-08-13 DIAGNOSIS — R194 Change in bowel habit: Secondary | ICD-10-CM | POA: Diagnosis not present

## 2021-08-13 DIAGNOSIS — G5702 Lesion of sciatic nerve, left lower limb: Secondary | ICD-10-CM | POA: Diagnosis not present

## 2021-08-13 DIAGNOSIS — M5442 Lumbago with sciatica, left side: Secondary | ICD-10-CM

## 2021-08-13 DIAGNOSIS — R140 Abdominal distension (gaseous): Secondary | ICD-10-CM

## 2021-08-13 DIAGNOSIS — R1084 Generalized abdominal pain: Secondary | ICD-10-CM | POA: Diagnosis not present

## 2021-08-13 DIAGNOSIS — K5900 Constipation, unspecified: Secondary | ICD-10-CM

## 2021-08-13 NOTE — Patient Instructions (Addendum)
Complete Meloxicam for 3 days and then discontinue Use muscle relaxer as needed Do prescribed exercises at least 3x a week Follow up as needed

## 2021-08-14 ENCOUNTER — Telehealth: Payer: Self-pay | Admitting: *Deleted

## 2021-08-14 ENCOUNTER — Encounter: Payer: Self-pay | Admitting: Gastroenterology

## 2021-08-14 NOTE — Telephone Encounter (Signed)
  Follow up Call-  Call back number 08/10/2021 07/31/2021  Post procedure Call Back phone  # [phone]  Permission to leave phone message Yes Yes  Some recent data might be hidden     Patient questions:  Do you have a fever, pain , or abdominal swelling? No. Pain Score  0 *  Have you tolerated food without any problems? Yes.    Have you been able to return to your normal activities? Yes.    Do you have any questions about your discharge instructions: Diet   No. Medications  No. Follow up visit  No.  Do you have questions or concerns about your Care? No.  Actions: * If pain score is 4 or above: No action needed, pain <4.  Have you developed a fever since your procedure? no  2.   Have you had an respiratory symptoms (SOB or cough) since your procedure? no  3.   Have you tested positive for COVID 19 since your procedure no  4.   Have you had any family members/close contacts diagnosed with the COVID 19 since your procedure?  no   If yes to any of these questions please route to Joylene John, RN and Joella Prince, RN

## 2021-08-20 ENCOUNTER — Other Ambulatory Visit (HOSPITAL_BASED_OUTPATIENT_CLINIC_OR_DEPARTMENT_OTHER): Payer: Self-pay

## 2021-08-21 ENCOUNTER — Other Ambulatory Visit (HOSPITAL_BASED_OUTPATIENT_CLINIC_OR_DEPARTMENT_OTHER): Payer: Self-pay

## 2021-08-21 DIAGNOSIS — Z6825 Body mass index (BMI) 25.0-25.9, adult: Secondary | ICD-10-CM | POA: Diagnosis not present

## 2021-08-21 DIAGNOSIS — Z01419 Encounter for gynecological examination (general) (routine) without abnormal findings: Secondary | ICD-10-CM | POA: Diagnosis not present

## 2021-08-21 LAB — HM PAP SMEAR

## 2021-08-21 LAB — PANCREATIC ELASTASE, FECAL: Pancreatic Elastase-1, Stool: >500 mcg/g

## 2021-09-03 NOTE — Telephone Encounter (Signed)
This should serve as documentation for patient's continue abdominal bloating alteration of bowel habits question IBS versus SIBO. Will initiate Xifaxan 550 mg twice daily x10 days. Patty, please move forward with ordering Xifaxan therapy. Thanks. GM

## 2021-09-04 ENCOUNTER — Other Ambulatory Visit: Payer: Self-pay

## 2021-09-04 MED ORDER — RIFAXIMIN 550 MG PO TABS: 550.0000 mg | ORAL_TABLET | Freq: Two times a day (BID) | ORAL | 0 refills | Status: AC

## 2021-09-05 DIAGNOSIS — Z23 Encounter for immunization: Secondary | ICD-10-CM | POA: Diagnosis not present

## 2021-09-10 ENCOUNTER — Other Ambulatory Visit (HOSPITAL_BASED_OUTPATIENT_CLINIC_OR_DEPARTMENT_OTHER): Payer: Self-pay

## 2021-09-14 ENCOUNTER — Other Ambulatory Visit (HOSPITAL_BASED_OUTPATIENT_CLINIC_OR_DEPARTMENT_OTHER): Payer: Self-pay

## 2021-09-17 ENCOUNTER — Other Ambulatory Visit (HOSPITAL_BASED_OUTPATIENT_CLINIC_OR_DEPARTMENT_OTHER): Payer: Self-pay

## 2021-09-17 MED ORDER — HYDROXYZINE HCL 10 MG PO TABS
ORAL_TABLET | ORAL | 0 refills | Status: DC
  Filled 2021-09-17: qty 30, 10d supply, fill #0

## 2021-10-02 ENCOUNTER — Other Ambulatory Visit (HOSPITAL_BASED_OUTPATIENT_CLINIC_OR_DEPARTMENT_OTHER): Payer: Self-pay

## 2021-10-02 DIAGNOSIS — N959 Unspecified menopausal and perimenopausal disorder: Secondary | ICD-10-CM | POA: Diagnosis not present

## 2021-10-03 ENCOUNTER — Other Ambulatory Visit (HOSPITAL_BASED_OUTPATIENT_CLINIC_OR_DEPARTMENT_OTHER): Payer: Self-pay

## 2021-10-08 ENCOUNTER — Other Ambulatory Visit (HOSPITAL_BASED_OUTPATIENT_CLINIC_OR_DEPARTMENT_OTHER): Payer: Self-pay

## 2021-10-09 ENCOUNTER — Other Ambulatory Visit (HOSPITAL_BASED_OUTPATIENT_CLINIC_OR_DEPARTMENT_OTHER): Payer: Self-pay

## 2021-10-09 MED ORDER — HYDROXYZINE HCL 10 MG PO TABS
ORAL_TABLET | ORAL | 0 refills | Status: DC
  Filled 2021-10-09: qty 30, 5d supply, fill #0

## 2021-10-09 MED ORDER — PROGESTERONE MICRONIZED 100 MG PO CAPS
ORAL_CAPSULE | ORAL | 11 refills | Status: DC
  Filled 2021-10-09: qty 30, 30d supply, fill #0
  Filled 2021-11-05: qty 30, 30d supply, fill #1
  Filled 2021-12-05: qty 30, 30d supply, fill #2
  Filled 2022-01-03: qty 30, 30d supply, fill #3

## 2021-10-19 ENCOUNTER — Other Ambulatory Visit (HOSPITAL_BASED_OUTPATIENT_CLINIC_OR_DEPARTMENT_OTHER): Payer: Self-pay

## 2021-11-05 ENCOUNTER — Other Ambulatory Visit (HOSPITAL_BASED_OUTPATIENT_CLINIC_OR_DEPARTMENT_OTHER): Payer: Self-pay

## 2021-11-05 MED ORDER — HYDROXYZINE HCL 10 MG PO TABS
ORAL_TABLET | ORAL | 1 refills | Status: DC
  Filled 2021-11-05: qty 30, 5d supply, fill #0
  Filled 2021-12-05: qty 30, 5d supply, fill #1

## 2021-11-12 ENCOUNTER — Telehealth: Payer: Self-pay | Admitting: Gastroenterology

## 2021-11-12 DIAGNOSIS — M791 Myalgia, unspecified site: Secondary | ICD-10-CM | POA: Diagnosis not present

## 2021-11-12 NOTE — Telephone Encounter (Signed)
Patient called requesting to speak with a nurse regarding her abdominal pain requested a call back.

## 2021-11-12 NOTE — Telephone Encounter (Signed)
Spoke with pt & made her aware that we would be in touch once we have heard GM's recommendations on how he would like to proceed.

## 2021-11-12 NOTE — Telephone Encounter (Signed)
Pt advice request sent to Dr Rush Landmark

## 2021-11-13 ENCOUNTER — Encounter: Payer: Self-pay | Admitting: Family Medicine

## 2021-11-13 NOTE — Telephone Encounter (Signed)
Michele [name], I would let RN Brinton know that when we did her CT scan last year, there was no evidence of any pancreas lesions so the likelihood of pancreas cancer is very low. If we have not tried the Creon supplements then I would try that, even though her fecal elastase was normal, and the small chance that it does help her.  We would do 72,000 units with each meal or at least 3 times daily and see how she does with that for a week to 10 days, pending what amount of supplements we have.  If we do not have Creon then she can be given Zenpep. I thought we had received more breath tests at this time, so if we do have them hopefully she can come and pick 1 up. Other than doing a MRI abdomen or an endoscopic ultrasound, which I am not sure insurance would cover with the negative CT, I am not sure how else we further rule out pancreas cancer as I already believe it not to be present. Thanks. GM

## 2021-11-20 ENCOUNTER — Ambulatory Visit: Payer: 59 | Admitting: Family Medicine

## 2021-11-20 DIAGNOSIS — B948 Sequelae of other specified infectious and parasitic diseases: Secondary | ICD-10-CM | POA: Insufficient documentation

## 2021-11-20 DIAGNOSIS — Z1211 Encounter for screening for malignant neoplasm of colon: Secondary | ICD-10-CM | POA: Insufficient documentation

## 2021-11-20 DIAGNOSIS — G8929 Other chronic pain: Secondary | ICD-10-CM

## 2021-11-20 HISTORY — DX: Sequelae of other specified infectious and parasitic diseases: B94.8

## 2021-11-20 HISTORY — DX: Other chronic pain: G89.29

## 2021-11-27 ENCOUNTER — Encounter: Payer: Self-pay | Admitting: Family Medicine

## 2021-12-05 ENCOUNTER — Other Ambulatory Visit (HOSPITAL_BASED_OUTPATIENT_CLINIC_OR_DEPARTMENT_OTHER): Payer: Self-pay

## 2021-12-07 ENCOUNTER — Encounter: Payer: Self-pay | Admitting: Family Medicine

## 2021-12-07 ENCOUNTER — Ambulatory Visit: Payer: 59 | Admitting: Family Medicine

## 2021-12-07 ENCOUNTER — Other Ambulatory Visit: Payer: Self-pay

## 2021-12-07 VITALS — BP 102/61 | HR 73 | Temp 98.2°F | Ht 62.75 in | Wt 137.0 lb

## 2021-12-07 DIAGNOSIS — R1012 Left upper quadrant pain: Secondary | ICD-10-CM

## 2021-12-07 DIAGNOSIS — R194 Change in bowel habit: Secondary | ICD-10-CM

## 2021-12-07 DIAGNOSIS — R140 Abdominal distension (gaseous): Secondary | ICD-10-CM | POA: Diagnosis not present

## 2021-12-07 NOTE — Progress Notes (Signed)
Patient ID: Michele [name], female  DOB: May 26, 1966, 56 y.o.   MRN: [id number] Patient Care Team    Relationship Specialty Notifications Start End  Ma Hillock, DO PCP - General Family Medicine  12/07/21   Dian Queen, MD Consulting Physician Obstetrics and Gynecology  12/07/21   Mansouraty, Telford Nab., MD Consulting Physician Gastroenterology  12/07/21   Iran Ouch, MD Referring Physician Ophthalmology  12/07/21   Melvenia Beam, MD Consulting Physician Neurology  12/07/21   Leanor Kail, [place] Physician Assistant Cardiology  12/07/21     Chief Complaint  Patient presents with   Establish Care   Abdominal Pain    Pt c/o LUQ abd pain, gas, bloating, soft stools with mucous, x 6 mo;     Subjective: Michele [name] is a 56 y.o.  female present for new patient establishment. All past medical history, surgical history, allergies, family history, immunizations, medications and social history were updated in the electronic medical record today. All recent labs, ED visits and hospitalizations within the last year were reviewed.   Patient presents for new patient establishment desiring to discuss her abdominal pain that has been present for approximately 6 months.  She reports the pain is mostly in the left upper quadrant and epigastric area.  She has increased abdominal bloating and gas.  She reports changes in her bowel habits that are new more frequent and soft stools.  The stools sometimes will have mucus present.  She denies blood per rectum.  She denies fevers or chills.  She denies travel prior to onset of symptoms.  She has had a work-up initiated by her gastroenterology team.  Her colonoscopy was normal with the exception of hemorrhoids.  She was treated with Xifaxan by her GI team without any change in symptoms.  She was prescribed pancreatic enzymes, also with no change.  Her pancreatic elastase was normal.  CRP normal.  Normal colonoscopy.  Normal CT.H. Pylori  bx negative.  Reports her diet has been good and she does not consume dairy or gluten.  As of today she has not been tried on any medicines for IBS or-like illnesses.  She has not had stool studies.  She currently is not taking a probiotic.  She denies any medication changes at the time of onset.  She states her anxiety levels currently are improved, she did have more anxiety rating summer.  Depression screen [facility] 2/9 12/07/2021 01/12/2020  Decreased Interest 0 3  Down, Depressed, Hopeless 0 2  PHQ - 2 Score 0 5   No flowsheet data found.      Fall Risk  01/12/2020  Falls in the past year? 0  Number falls in past yr: 0  Injury with Fall? 0   Immunization History  Administered Date(s) Administered   Influenza Split 08/07/2015, 07/07/2019   Influenza, Quadrivalent, Recombinant, Inj, Pf 09/05/2021    No results found.  Past Medical History:  Diagnosis Date   Abdominal bloating    Anemia    Anxiety    Arthritis    Chicken pox    Chronic pain 11/20/2021   DDD (degenerative disc disease), cervical    Depression    Dorsalgia    Glaucoma    Hypercholesterolemia    Intestinal gas excretion    Migraine 08/17/2020   Nonallopathic lesion of cervical region 01/23/2021   l areas are chronic   Patient tolerated the procedure well with improvement in symptoms  Patient given exercises, stretches and lifestyle  modifications  See medications in patient instructions if given  Patient will follow up in 4-8 weeks   Peptic ulcer    Scapular dyskinesis 01/23/2021   Scarlet fever    Sequelae of other specified infectious and parasitic diseases 11/20/2021   Skin cancer    Tick bite 02/22/2018   UTI (urinary tract infection)    Yeast infection    Allergies  Allergen Reactions   Lipitor [Atorvastatin Calcium] Other (See Comments)    Memory issue; myalgia   Celebrex [Celecoxib] Rash   Past Surgical History:  Procedure Laterality Date   BASAL CELL CARCINOMA EXCISION     BREAST BIOPSY Left 1992    Duct removal (No scar seen)    BREAST LUMPECTOMY Left 1992   COLONOSCOPY  07/2021   DACRORHINOCYSTOTOMY  10/2020   LEFT HEART CATH AND CORONARY ANGIOGRAPHY N/A 01/16/2018   Procedure: LEFT HEART CATH AND CORONARY ANGIOGRAPHY;  Surgeon: Nelva Bush, MD;  Location: [facility];  Service: Cardiovascular;  Laterality: N/A;   UPPER GASTROINTESTINAL ENDOSCOPY  07/2021   Family History  Problem Relation Age of Onset   Hypertension Mother    Diabetes Mother    Diverticulitis Mother    Hyperlipidemia Father    Diabetes Father    Heart attack Father    Arrhythmia Father    Heart attack Sister    Obesity Sister    Arthritis Sister    Asthma Sister    Alcohol abuse Brother    Stroke Maternal Uncle    Pancreatic cancer Maternal Uncle    Leukemia Maternal Grandmother    Diabetes Maternal Grandfather    Diabetes Paternal Grandmother    Heart attack Paternal Grandfather    Heart disease Paternal Grandfather    Liver disease Other        Paternal great aunt   Breast cancer Neg Hx    Colon cancer Neg Hx    Esophageal cancer Neg Hx    Inflammatory bowel disease Neg Hx    Rectal cancer Neg Hx    Stomach cancer Neg Hx    Social History   Social History Narrative   Marital status/children/pets: 2 children.    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 If you have any questions, ask your nurse or doctor.          STOP taking these medications    b complex vitamins tablet Stopped by: Howard Pouch, DO   COD LIVER OIL PO Stopped by: Howard Pouch, DO   cyclobenzaprine 10 MG  tablet Commonly known as: FLEXERIL Stopped by: Howard Pouch, DO   desipramine 25 MG tablet Commonly known as: Norpramin Stopped by: Howard Pouch, DO   ferrous gluconate 324 MG tablet Commonly known as: FERGON Stopped by: Howard Pouch, DO   magnesium oxide 400 MG tablet Commonly known as: MAG-OX Stopped by: Howard Pouch, DO   meloxicam 15 MG tablet Commonly known as: MOBIC  Stopped by: Howard Pouch, DO   sucralfate 1 GM/10ML suspension Commonly known as: Carafate Stopped by: Howard Pouch, DO       TAKE these medications    aspirin EC 81 MG tablet Take 1 tablet by mouth daily.   FLUoxetine 10 MG capsule Commonly known as: PROZAC TAKE 1 CAPSULE BY MOUTH ONCE DAILY ALTERNATING WITH 2 CAPSULES EVERY OTHER DAY 90 days What changed:  how much to take how to take this when to take this Another medication with the same name was removed. Continue taking this medication, and follow the directions you see here.   gabapentin 300 MG capsule Commonly known as: NEURONTIN take 1 capsule by mouth 3 times a day What changed:  how much to take how to take this when to take this   hydrOXYzine 10 MG tablet Commonly known as: ATARAX Take 1 to 2 tablets by mouth 3 times daily as needed for itching   progesterone 100 MG capsule Commonly known as: Prometrium Take 1 capsule by mouth once daily   Vitamin D3 25 MCG/SPRAY Liqd Take 250 mcg by mouth daily.        All past medical history, surgical history, allergies, family history, immunizations andmedications were updated in the EMR today and reviewed under the history and medication portions of their EMR.    No results found for this or any previous visit (from the past 2160 hour(s)).  CT Abdomen Pelvis W Contrast Result Date: 08/03/2021 OMNIPAQUE IOHEXOL 300 MG/ML  SOLN COMPARISON:  IMPRESSION: 1. No findings abscess, extraluminal gas, pneumatosis, or other specific complicating features from colonoscopy. 2.  Prominent stool  throughout the colon favors constipation. 3. A total of 4 hypodense hepatic lesions are identified. Two of these are thought to be hemangiomas based on delayed enhancement, but 2 of the lesions are too small to characterize. Although highly likely to be benign, if the patient has abnormal liver enzymes or if otherwise clinically warranted, these could be further worked up with hepatic protocol MRI with and without contrast 4. Mild left foraminal stenosis at L4-5. Electronically Signed   By: Van Clines M.D.   On: 08/03/2021 12:18    ROS 14 pt review of systems performed and negative (unless mentioned in an HPI)  Objective:  BP 102/61    Pulse 73    Temp 98.2 F (36.8 C) (Oral)    Ht 5' 2.75" (1.594 m)    Wt 137 lb (62.1 kg)    LMP 11/29/2021    SpO2 98%    BMI 24.46 kg/m  Physical Exam Vitals and nursing note reviewed.  Constitutional:      General: She is not in acute distress.    Appearance: Normal appearance. She is not ill-appearing, toxic-appearing or diaphoretic.  HENT:     Head: Normocephalic and atraumatic.     Mouth/Throat:     Mouth: Mucous membranes are moist.  Eyes:     General: No scleral icterus.       Right eye: No discharge.        Left eye: No discharge.     Extraocular Movements: Extraocular movements intact.     Conjunctiva/sclera: Conjunctivae normal.     Pupils: Pupils are equal, round, and reactive to light.  Cardiovascular:     Rate and Rhythm: Normal rate and regular rhythm.  Pulmonary:     Effort: Pulmonary effort is normal. No respiratory distress.     Breath sounds: Normal breath sounds. No wheezing, rhonchi or rales.  Abdominal:     General: Abdomen is flat. Bowel sounds are normal. There is no distension.     Palpations: Abdomen is soft. There is no fluid wave, hepatomegaly, splenomegaly, mass or pulsatile mass.     Tenderness: There is abdominal tenderness in the epigastric area, left upper quadrant and left lower quadrant. There is no guarding  or rebound. Negative signs include Murphy's sign and McBurney's sign.     Hernia: No hernia is present.  Musculoskeletal:     Cervical back: Neck supple. No tenderness.  Lymphadenopathy:     Cervical: No cervical adenopathy.  Skin:    General: Skin is warm and dry.     Coloration: Skin is not jaundiced or pale.     Findings: No erythema or rash.  Neurological:     Mental Status: She is alert and oriented to person, place, and time. Mental status is at baseline.     Motor: No weakness.     Gait: Gait normal.  Psychiatric:        Mood and Affect: Mood normal.        Behavior: Behavior normal.        Thought Content: Thought content normal.        Judgment: Judgment normal.      Assessment/plan: KRYSIA ZAHRADNIK is a 56 y.o. female present for establish care Abdominal pain, acute, left upper quadrant/bloating/change in bowel habits Lengthy discussion with patient today surrounding her ongoing symptoms.  Possibly could be related to IBS development.  Will obtain stool studies to rule out infectious or inflammatory causes. Could consider medication depending upon those results: Palmar, Levsin, Bentyl, Flagyl, antibiotic Would encourage start of Florastor if stool studies do not indicate infectious causes. Goal is to rule out any harmful causes of her stool changes, then address possible discomfort with medication such as IBS if indicated. - Fecal lactoferrin, quant; Future - Gastrointestinal Pathogen Panel PCR; Future - Ova and parasite examination; Future Follow-up dependent upon lab results.   Return for [facility].  Orders Placed This Encounter  Procedures   Ova and parasite examination   Fecal lactoferrin, quant   Gastrointestinal Pathogen Panel PCR   No orders of the defined types were placed in this encounter.  Referral Orders  No referral(s) requested today     Note is dictated utilizing voice recognition software. Although note has been proof read prior to signing,  occasional typographical errors still can be missed. If any questions arise, please do not hesitate to call for verification.  Electronically signed by: Howard Pouch, DO [place]

## 2021-12-10 DIAGNOSIS — R194 Change in bowel habit: Secondary | ICD-10-CM | POA: Diagnosis not present

## 2021-12-10 DIAGNOSIS — R1012 Left upper quadrant pain: Secondary | ICD-10-CM | POA: Diagnosis not present

## 2021-12-10 DIAGNOSIS — R140 Abdominal distension (gaseous): Secondary | ICD-10-CM | POA: Diagnosis not present

## 2021-12-17 ENCOUNTER — Other Ambulatory Visit (HOSPITAL_BASED_OUTPATIENT_CLINIC_OR_DEPARTMENT_OTHER): Payer: Self-pay

## 2021-12-17 ENCOUNTER — Telehealth: Payer: Self-pay | Admitting: Family Medicine

## 2021-12-17 LAB — GASTROINTESTINAL PATHOGEN PANEL PCR
C. difficile Tox A/B, PCR: NOT DETECTED
Campylobacter, PCR: NOT DETECTED
Cryptosporidium, PCR: NOT DETECTED
E coli (ETEC) LT/ST PCR: NOT DETECTED
E coli (STEC) stx1/stx2, PCR: NOT DETECTED
E coli 0157, PCR: NOT DETECTED
Giardia lamblia, PCR: NOT DETECTED
Norovirus, PCR: NOT DETECTED
Rotavirus A, PCR: NOT DETECTED
Salmonella, PCR: NOT DETECTED
Shigella, PCR: NOT DETECTED

## 2021-12-17 LAB — OVA AND PARASITE EXAMINATION
CONCENTRATE RESULT:: NONE SEEN
MICRO NUMBER:: 13034510
SPECIMEN QUALITY:: ADEQUATE
TRICHROME RESULT:: NONE SEEN

## 2021-12-17 LAB — FECAL LACTOFERRIN, QUANT
Fecal Lactoferrin: NEGATIVE
MICRO NUMBER:: 13032884
SPECIMEN QUALITY:: ADEQUATE

## 2021-12-17 LAB — TIQ-NTM

## 2021-12-17 MED ORDER — SACCHAROMYCES BOULARDII 250 MG PO CAPS
250.0000 mg | ORAL_CAPSULE | Freq: Two times a day (BID) | ORAL | 5 refills | Status: DC
  Filled 2021-12-17: qty 60, 30d supply, fill #0

## 2021-12-17 MED ORDER — DICYCLOMINE HCL 10 MG PO CAPS
10.0000 mg | ORAL_CAPSULE | Freq: Three times a day (TID) | ORAL | 1 refills | Status: DC
  Filled 2021-12-17: qty 120, 15d supply, fill #0

## 2021-12-17 MED ORDER — METRONIDAZOLE 500 MG PO TABS
500.0000 mg | ORAL_TABLET | Freq: Three times a day (TID) | ORAL | 0 refills | Status: AC
  Filled 2021-12-17: qty 21, 7d supply, fill #0

## 2021-12-17 NOTE — Telephone Encounter (Signed)
Please inform patient the following: The white blood cell test was negative.  This is a normal result. Ova and parasite test was negative. We completed a complete GI pathogen stool study which tested for 16 potential GI pathogens.  All are negative for her.  Although this is great news as no infections and normal labs, it does not give her an answer as the potential cause for her stomach discomfort.  Did call in the probiotic called Florastor which is 1 tab twice daily.  If this is not covered by her insurance she can purchase this over-the-counter or even on [employer].  During her visit we discussed considering start of medications. -Recommendations: Start Florastor.  Called in Flagyl 3 times daily x7 days-is a long shot but may see some improvement with this especially if an ova/parasite was present and missed. -Lastly, I have called in a medication called Bentyl.  This is taken before meals when needed to help slow down but that so that fecal urgency is not present.  Most people tend to take this during times when expecting to eat a meal away from home.   If symptoms are still not adequately controlled or at least better controlled in 8 weeks, I would encourage her to follow-up and we can discuss daily medication use to help control IBS.

## 2021-12-18 ENCOUNTER — Other Ambulatory Visit (HOSPITAL_BASED_OUTPATIENT_CLINIC_OR_DEPARTMENT_OTHER): Payer: Self-pay

## 2021-12-18 NOTE — Telephone Encounter (Signed)
LVM for pt to CB regarding results.  

## 2021-12-19 ENCOUNTER — Other Ambulatory Visit (HOSPITAL_BASED_OUTPATIENT_CLINIC_OR_DEPARTMENT_OTHER): Payer: Self-pay

## 2021-12-19 NOTE — Telephone Encounter (Signed)
Spoke with pt regarding labs and instructions.   

## 2022-01-03 ENCOUNTER — Other Ambulatory Visit (HOSPITAL_BASED_OUTPATIENT_CLINIC_OR_DEPARTMENT_OTHER): Payer: Self-pay

## 2022-01-04 ENCOUNTER — Other Ambulatory Visit (HOSPITAL_BASED_OUTPATIENT_CLINIC_OR_DEPARTMENT_OTHER): Payer: Self-pay

## 2022-01-04 MED ORDER — HYDROXYZINE HCL 10 MG PO TABS
10.0000 mg | ORAL_TABLET | Freq: Three times a day (TID) | ORAL | 1 refills | Status: DC | PRN
  Filled 2022-01-04: qty 30, 5d supply, fill #0
  Filled 2022-02-11: qty 30, 5d supply, fill #1

## 2022-01-22 DIAGNOSIS — N951 Menopausal and female climacteric states: Secondary | ICD-10-CM | POA: Diagnosis not present

## 2022-01-24 ENCOUNTER — Ambulatory Visit: Payer: 59 | Admitting: Sports Medicine

## 2022-01-24 NOTE — Progress Notes (Signed)
? ? Benito Mccreedy D.Merril Abbe ?[facility] ?[place] ?Phone: [phone] ?  ?Assessment and Plan:   ?  ?1. Left knee pain, chronic ?2. Patellar tendinitis of left knee ?-Chronic with exacerbation, initial sports medicine visit ?-Consistent with patellar tendinopathy based on HPI, physical exam, hyperechoic stranding at distal patellar tendon ?- Patient has had better response to steroids in the past compared with NSAIDs.  Will prescribe prednisone Dosepak ?- May start topical Voltaren gel as needed ?- HEP for patellar tendinitis ?- Recommend buying patellar knee strap to use during the day ?  ?Patient has been using gabapentin and recently increased her dose.  Recommend discussing with prescribing provider her long-term dosage.  Educated that gabapentin would have limited effect on patellar tendinitis. ? ?Sports Medicine: Musculoskeletal Ultrasound. ?  ?Exam:Left [place] complete Knee ?Diagnosis:  ?Anterior knee pain ? ?Patellar tendon: Abnormal-  hyperechoic stranding at distal patellar tendon consistent with chronic patellar tendinopathy ?Quadriceps tendon:  Normal ?Pes anserine bursa/tendons: Normal ?Lateral structures: Normal ?Posterior knee: Normal ?Effusion: absent ?Additional findings: none ?   ?Impression:  ?1.  Calcific patellar tendinopathy ? ? ?Pertinent previous records reviewed include none ?  ?Follow Up: 3 to 4 weeks if no improvement or worsening of symptoms..  Could consider repeat ultrasound versus x-ray versus NSAID course versus physical therapy versus CSI ?  ?Subjective:   ?I, Pincus Badder, am serving as a Education administrator for [employer] ? ?Chief Complaint: left patellar tendon pain  ? ?HPI:  ?01/28/2022 ?Patient is a 56 year old female complaining of left patellar tendon pain . Patient states that she has had for the last 2 weeks lots of patellar tendon pain, 2 years ago she went to [facility] same sharp shooting pain with the left hip flexor pain,  the pains have started to wake her at night she is using gabapentin, had to take a flexeril to sleep last night due to the pain , no numbness or tingling just pain , pain stays right in the knee and hip flexor stays right above the quad and sometimes it radiates to her lower left back and left butt cheek has an L3 bulging disc  ? ?Relevant Historical Information: GERD ? ?Additional pertinent review of systems negative. ? ? ?Current Outpatient Medications:  ?  aspirin EC 81 MG tablet, Take 1 tablet by mouth daily., Disp: , Rfl:  ?  Cholecalciferol (VITAMIN D3) 25 MCG/SPRAY LIQD, Take 250 mcg by mouth daily., Disp: , Rfl:  ?  dicyclomine (BENTYL) 10 MG capsule, Take 1-2 capsules (10-20 mg total) by mouth 4 (four) times daily -  before meals and at bedtime., Disp: 120 capsule, Rfl: 1 ?  FLUoxetine (PROZAC) 10 MG capsule, TAKE 1 CAPSULE BY MOUTH ONCE DAILY ALTERNATING WITH 2 CAPSULES EVERY OTHER DAY 90 days (Patient taking differently: Take 10 mg by mouth every other day.), Disp: 135 capsule, Rfl: 3 ?  gabapentin (NEURONTIN) 300 MG capsule, Take 1 capsule by mouth 3 times a day, Disp: 90 capsule, Rfl: 11 ?  hydrOXYzine (ATARAX) 10 MG tablet, Take 1-2 tablets (10-20 mg total) by mouth 3 (three) times daily as needed for itching., Disp: 30 tablet, Rfl: 1 ?  methylPREDNISolone (MEDROL DOSEPAK) 4 MG TBPK tablet, Follow instructions on package, Disp: 21 tablet, Rfl: 0 ?  progesterone (PROMETRIUM) 100 MG capsule, Take 1 capsule by mouth once daily, Disp: 30 capsule, Rfl: 11 ?  saccharomyces boulardii (FLORASTOR) 250 MG capsule, Take 1 capsule (250 mg  total) by mouth 2 (two) times daily., Disp: 60 capsule, Rfl: 5  ? ?Objective:   ?  ?Vitals:  ? 01/28/22 1327  ?BP: 118/80  ?Pulse: 76  ?SpO2: 99%  ?Weight: 144 lb (65.3 kg)  ?Height: '5\' 2"'$  (1.575 m)  ?  ?  ?Body mass index is 26.34 kg/m?.  ?  ?Physical Exam:   ? ?General:  awake, alert oriented, no acute distress nontoxic ?Skin: no suspicious lesions or rashes ?Neuro:sensation  intact, no deficits, strength 5/5 with no deficits, no atrophy, normal muscle tone ?Psych: No signs of anxiety, depression or other mood disorder ? ?Knee: ?No swelling ?No deformity ?Neg fluid wave, joint milking ?ROM Flex 110 , Ext 0  ?TTP patellar tendon ?NTTP over the quad tendon, medial fem condyle, lat fem condyle, patella, plica,   tibial tuberostiy, fibular head, posterior fossa, pes anserine bursa, gerdy's tubercle, medial jt line, lateral jt line ?Neg anterior and posterior drawer ?Neg lachman ?Neg sag sign ?Negative varus stress ?Negative valgus stress ?Negative McMurray ?Negative Thessaly ? ?Gait normal  ? ? ?Electronically signed by:  ?Benito Mccreedy D.Merril Abbe ?[facility] ?1:59 PM 01/28/22 ?

## 2022-01-28 ENCOUNTER — Other Ambulatory Visit (HOSPITAL_BASED_OUTPATIENT_CLINIC_OR_DEPARTMENT_OTHER): Payer: Self-pay

## 2022-01-28 ENCOUNTER — Ambulatory Visit (INDEPENDENT_AMBULATORY_CARE_PROVIDER_SITE_OTHER): Payer: 59 | Admitting: Sports Medicine

## 2022-01-28 ENCOUNTER — Ambulatory Visit: Payer: Self-pay

## 2022-01-28 VITALS — BP 118/80 | HR 76 | Ht 62.0 in | Wt 144.0 lb

## 2022-01-28 DIAGNOSIS — M7652 Patellar tendinitis, left knee: Secondary | ICD-10-CM | POA: Diagnosis not present

## 2022-01-28 DIAGNOSIS — G8929 Other chronic pain: Secondary | ICD-10-CM

## 2022-01-28 DIAGNOSIS — M25562 Pain in left knee: Secondary | ICD-10-CM

## 2022-01-28 MED ORDER — METHYLPREDNISOLONE 4 MG PO TBPK
ORAL_TABLET | ORAL | 0 refills | Status: DC
  Filled 2022-01-28: qty 21, 6d supply, fill #0

## 2022-01-28 NOTE — Patient Instructions (Addendum)
Good to see you  ?[place]  ?Voltaren gel  ?Patellar tendinitis HEP  ?Patellar tendon strap ?As needed follow up if no improvement 2-4 week follow up  ?

## 2022-01-31 ENCOUNTER — Other Ambulatory Visit (HOSPITAL_BASED_OUTPATIENT_CLINIC_OR_DEPARTMENT_OTHER): Payer: Self-pay

## 2022-01-31 MED ORDER — PROGESTERONE MICRONIZED 100 MG PO CAPS
ORAL_CAPSULE | ORAL | 2 refills | Status: DC
  Filled 2022-01-31: qty 120, 90d supply, fill #0
  Filled 2022-06-01: qty 120, 90d supply, fill #1
  Filled 2022-08-28: qty 120, 90d supply, fill #2

## 2022-02-11 ENCOUNTER — Other Ambulatory Visit (HOSPITAL_BASED_OUTPATIENT_CLINIC_OR_DEPARTMENT_OTHER): Payer: Self-pay

## 2022-02-11 ENCOUNTER — Ambulatory Visit (INDEPENDENT_AMBULATORY_CARE_PROVIDER_SITE_OTHER): Payer: 59 | Admitting: Family Medicine

## 2022-02-11 VITALS — BP 100/62 | HR 101 | Ht 62.0 in | Wt 151.0 lb

## 2022-02-11 DIAGNOSIS — M9902 Segmental and somatic dysfunction of thoracic region: Secondary | ICD-10-CM

## 2022-02-11 DIAGNOSIS — M9908 Segmental and somatic dysfunction of rib cage: Secondary | ICD-10-CM

## 2022-02-11 DIAGNOSIS — M9901 Segmental and somatic dysfunction of cervical region: Secondary | ICD-10-CM | POA: Diagnosis not present

## 2022-02-11 DIAGNOSIS — G8929 Other chronic pain: Secondary | ICD-10-CM

## 2022-02-11 DIAGNOSIS — M545 Low back pain, unspecified: Secondary | ICD-10-CM | POA: Insufficient documentation

## 2022-02-11 DIAGNOSIS — M5442 Lumbago with sciatica, left side: Secondary | ICD-10-CM | POA: Diagnosis not present

## 2022-02-11 DIAGNOSIS — M25552 Pain in left hip: Secondary | ICD-10-CM

## 2022-02-11 DIAGNOSIS — M9904 Segmental and somatic dysfunction of sacral region: Secondary | ICD-10-CM | POA: Diagnosis not present

## 2022-02-11 DIAGNOSIS — M9903 Segmental and somatic dysfunction of lumbar region: Secondary | ICD-10-CM | POA: Diagnosis not present

## 2022-02-11 MED ORDER — KETOROLAC TROMETHAMINE 60 MG/2ML IM SOLN
60.0000 mg | Freq: Once | INTRAMUSCULAR | Status: AC
  Administered 2022-02-11: 60 mg via INTRAMUSCULAR

## 2022-02-11 MED ORDER — GABAPENTIN 100 MG PO CAPS
200.0000 mg | ORAL_CAPSULE | Freq: Two times a day (BID) | ORAL | 0 refills | Status: DC
  Filled 2022-02-11: qty 180, 45d supply, fill #0

## 2022-02-11 MED ORDER — MELOXICAM 15 MG PO TABS
15.0000 mg | ORAL_TABLET | Freq: Every day | ORAL | 0 refills | Status: DC
  Filled 2022-02-11: qty 10, 10d supply, fill #0

## 2022-02-11 MED ORDER — METHYLPREDNISOLONE ACETATE 80 MG/ML IJ SUSP
80.0000 mg | Freq: Once | INTRAMUSCULAR | Status: AC
  Administered 2022-02-11: 80 mg via INTRAMUSCULAR

## 2022-02-11 NOTE — Progress Notes (Signed)
?Charlann Boxer D.O. ?[facility] ?[place] ?Phone: [phone] ?Subjective:   ?I, Jacqualin Combes, am serving as a scribe for Dr. Hulan Saas. ? ?This visit occurred during the SARS-CoV-2 public health emergency.  Safety protocols were in place, including screening questions prior to the visit, additional usage of staff PPE, and extensive cleaning of exam room while observing appropriate contact time as indicated for disinfecting solutions.  ? ? ?I'm seeing this patient by the request  of:  [name], [name], DO ? ?CC: left hip and knee pain  ? ?[id number]  ?BURMA KETCHER is a 56 y.o. female coming in with complaint of L hip and knee pain. Saw [name] for patellar tendonitis of L knee 2 weeks ago. Knee has improved. Today she is complaining of L anterior hip. Was unable to walk a far distance yesterday due to pain. Also notes it is hard to sit for long periods of time. Feels that her pain is coming from her back as she has a disc issue that was diagnosed by Percell Miller and Noemi Chapel years ago. Has a hard time sleeping.  ? ? ?  ? ?Past Medical History:  ?Diagnosis Date  ? Abdominal bloating   ? Anemia   ? Anxiety   ? Arthritis   ? Chicken pox   ? Chronic pain 11/20/2021  ? DDD (degenerative disc disease), cervical   ? Depression   ? Dorsalgia   ? Glaucoma   ? Hypercholesterolemia   ? Intestinal gas excretion   ? Migraine 08/17/2020  ? Nonallopathic lesion of cervical region 01/23/2021  ? l areas are chronic   Patient tolerated the procedure well with improvement in symptoms  Patient given exercises, stretches and lifestyle modifications  See medications in patient instructions if given  Patient will follow up in 4-8 weeks  ? Peptic ulcer   ? Scapular dyskinesis 01/23/2021  ? Scarlet fever   ? Sequelae of other specified infectious and parasitic diseases 11/20/2021  ? Skin cancer   ? Tick bite 02/22/2018  ? UTI (urinary tract infection)   ? Yeast infection   ? ?Past Surgical History:   ?Procedure Laterality Date  ? BASAL CELL CARCINOMA EXCISION    ? BREAST BIOPSY Left 1992  ? Duct removal (No scar seen)   ? BREAST LUMPECTOMY Left 1992  ? COLONOSCOPY  07/2021  ? DACRORHINOCYSTOTOMY  10/2020  ? LEFT HEART CATH AND CORONARY ANGIOGRAPHY N/A 01/16/2018  ? Procedure: LEFT HEART CATH AND CORONARY ANGIOGRAPHY;  Surgeon: Nelva Bush, MD;  Location: [facility];  Service: Cardiovascular;  Laterality: N/A;  ? UPPER GASTROINTESTINAL ENDOSCOPY  07/2021  ? ?Social History  ? ?Socioeconomic History  ? Marital status: Married  ?  Spouse name: Not on file  ? Number of children: 2  ? Years of education: Not on file  ? Highest education level: Not on file  ?Occupational History  ? Occupation: Therapist, sports  ?  Employer: [employer]  ?Tobacco Use  ? Smoking status: Never  ?  Passive exposure: Never  ? Smokeless tobacco: Never  ?Vaping Use  ? Vaping Use: Never used  ?Substance and Sexual Activity  ? Alcohol use: Yes  ?  Alcohol/week: 4.0 standard drinks  ?  Types: 4 Glasses of wine per week  ?  Comment: 1 per day  ? Drug use: No  ? Sexual activity: Yes  ?  Partners: Male  ?Other Topics Concern  ? Not on file  ?Social  History Narrative  ? Marital status/children/pets: 2 children.   ? Education/employment: RN  ? Safety:   ?   -Wears a bicycle helmet riding a bike: Yes  ?   -smoke alarm in the home:Yes  ?   - wears seatbelt: Yes  ?   - Feels safe in their relationships: Yes  ?   ? ?Social Determinants of Health  ? ?Financial Resource Strain: Not on file  ?Food Insecurity: Not on file  ?Transportation Needs: Not on file  ?Physical Activity: Not on file  ?Stress: Not on file  ?Social Connections: Not on file  ? ?Allergies  ?Allergen Reactions  ? Lipitor [Atorvastatin Calcium] Other (See Comments)  ?  Memory issue; myalgia  ? Celebrex [Celecoxib] Rash  ? ?Family History  ?Problem Relation Age of Onset  ? Hypertension Mother   ? Diabetes Mother   ? Diverticulitis Mother   ? Hyperlipidemia Father   ? Diabetes Father   ?  Heart attack Father   ? Arrhythmia Father   ? Heart attack Sister   ? Obesity Sister   ? Arthritis Sister   ? Asthma Sister   ? Alcohol abuse Brother   ? Stroke Maternal Uncle   ? Pancreatic cancer Maternal Uncle   ? Leukemia Maternal Grandmother   ? Diabetes Maternal Grandfather   ? Diabetes Paternal Grandmother   ? Heart attack Paternal Grandfather   ? Heart disease Paternal Grandfather   ? Liver disease Other   ?     Paternal great aunt  ? Breast cancer Neg Hx   ? Colon cancer Neg Hx   ? Esophageal cancer Neg Hx   ? Inflammatory bowel disease Neg Hx   ? Rectal cancer Neg Hx   ? Stomach cancer Neg Hx   ? ? ?Current Outpatient Medications (Endocrine & Metabolic):  ?  methylPREDNISolone (MEDROL DOSEPAK) 4 MG TBPK tablet, Follow instructions on package ?  progesterone (PROMETRIUM) 100 MG capsule, Take 1 capsule by mouth once daily ?  progesterone (PROMETRIUM) 100 MG capsule, Take 1 tablet by mouth every night at bedtime. Increase to 2 capsules at bedtime prior to cycle. ? ? ? ?Current Outpatient Medications (Analgesics):  ?  aspirin EC 81 MG tablet, Take 1 tablet by mouth daily. ?  meloxicam (MOBIC) 15 MG tablet, Take 1 tablet (15 mg total) by mouth daily. ? ? ?Current Outpatient Medications (Other):  ?  Cholecalciferol (VITAMIN D3) 25 MCG/SPRAY LIQD, Take 250 mcg by mouth daily. ?  dicyclomine (BENTYL) 10 MG capsule, Take 1-2 capsules (10-20 mg total) by mouth 4 (four) times daily -  before meals and at bedtime. ?  FLUoxetine (PROZAC) 10 MG capsule, TAKE 1 CAPSULE BY MOUTH ONCE DAILY ALTERNATING WITH 2 CAPSULES EVERY OTHER DAY 90 days (Patient taking differently: Take 10 mg by mouth every other day.) ?  gabapentin (NEURONTIN) 100 MG capsule, Take 2 capsules (200 mg total) by mouth 2 (two) times daily. ?  gabapentin (NEURONTIN) 300 MG capsule, Take 1 capsule by mouth 3 times a day ?  hydrOXYzine (ATARAX) 10 MG tablet, Take 1-2 tablets (10-20 mg total) by mouth 3 (three) times daily as needed for itching. ?   saccharomyces boulardii (FLORASTOR) 250 MG capsule, Take 1 capsule (250 mg total) by mouth 2 (two) times daily. ? ? ?Reviewed prior external information including notes and imaging from  ?primary care provider ?As well as notes that were available from care everywhere and other healthcare systems. ? ?Past medical history, social, surgical and  family history all reviewed in electronic medical record.  No pertanent information unless stated regarding to the chief complaint.  ? ?Review of Systems: ? No headache, visual changes, nausea, vomiting, diarrhea, constipation, dizziness, abdominal pain, skin rash, fevers, chills, night sweats, weight loss, swollen lymph nodes, body aches, joint swelling, chest pain, shortness of breath, mood changes. POSITIVE muscle aches ? ?Objective  ?Blood pressure 100/62, pulse (!) 101, height '5\' 2"'$  (1.575 m), weight 151 lb (68.5 kg), SpO2 99 %. ?  ?General: No apparent distress alert and oriented x3 mood and affect normal, dressed appropriately.  ?HEENT: Pupils equal, extraocular movements intact  ?Respiratory: Patient's speak in full sentences and does not appear short of breath  ?Cardiovascular: No lower extremity edema, non tender, no erythema  ?Gait normal with good balance and coordination.  ?MSK: Patient is highly uncomfortable on exam today.  Tenderness to palpation in the paraspinal musculature of the lumbar spine and left hip.  Patient does have pain with internal and external range of motion.  Tender to palpation over the left sacroiliac joint.  No worsening pain though with a straight leg test. TTP left hip  ? ?Osteopathic findings ?C2 flexed rotated and side bent right ?C7 flexed rotated and side bent left ?T3 extended rotated and side bent right inhaled third rib ?T8 extended rotated and side bent left ?L2 flexed rotated and side bent right ?Sacrum right on right ? ? ?  ?Impression and Recommendations:  ?  ? ?The above documentation has been reviewed and is accurate and  complete Lyndal Pulley, DO ? ? ? ?

## 2022-02-11 NOTE — Assessment & Plan Note (Signed)
Patient is having low back pain that seems to be more secondary to a tendinitis of the hip flexor. ?Patient's left hip does have relatively good range of motion.  We will get x-rays to further evaluate the pelvis and the lumbar spine.  Toradol and Depo-Medrol given today secondary to the discomfort and pain.  Short course of anti-inflammatories also given.  Discussed which activities to do and which ones to avoid.  Gabapentin and encouraged to consider increasing the daily aspects of this as well.  Follow-up with me again in 6 to 8 weeks. ?

## 2022-02-11 NOTE — Patient Instructions (Addendum)
Do prescribed exercises at least 3x a week ?No other NSAID today ?Meloxicam '15mg'$  for 10 days, start tomorrow ?Gabapentin '200mg'$  BID ?Continue other gabapentin at night ?Send message in 2-3 weeks if not better consider epi at l3/l4 ?See you again in 5-6 weeks ?

## 2022-03-01 ENCOUNTER — Ambulatory Visit: Payer: 59 | Admitting: Family Medicine

## 2022-03-22 ENCOUNTER — Telehealth: Payer: Self-pay

## 2022-03-22 NOTE — Telephone Encounter (Signed)
Patient last seen in this office 11/2021. I am not certain what her concern is from the message.  Please call patient and get more detail from her.

## 2022-03-22 NOTE — Telephone Encounter (Signed)
Pt states she also experiencing fatigue and brain fog x last 2 weeks; no other urinary sx.

## 2022-03-22 NOTE — Telephone Encounter (Signed)
Patient states she only urinated 1 time yesterday.  She is wanting to know if should be concerned and/or change meds.  Please advise, patient can be reached at [phone]

## 2022-03-22 NOTE — Telephone Encounter (Signed)
Pt informed to monitor sx over weekend and to sched appt if remain for Monday.

## 2022-03-22 NOTE — Telephone Encounter (Signed)
Please advise 

## 2022-03-31 ENCOUNTER — Other Ambulatory Visit (HOSPITAL_BASED_OUTPATIENT_CLINIC_OR_DEPARTMENT_OTHER): Payer: Self-pay

## 2022-04-01 ENCOUNTER — Other Ambulatory Visit: Payer: Self-pay | Admitting: Family Medicine

## 2022-04-01 ENCOUNTER — Other Ambulatory Visit (HOSPITAL_BASED_OUTPATIENT_CLINIC_OR_DEPARTMENT_OTHER): Payer: Self-pay

## 2022-04-01 DIAGNOSIS — H04422 Chronic lacrimal canaliculitis of left lacrimal passage: Secondary | ICD-10-CM | POA: Diagnosis not present

## 2022-04-01 DIAGNOSIS — H02055 Trichiasis without entropian left lower eyelid: Secondary | ICD-10-CM | POA: Diagnosis not present

## 2022-04-01 DIAGNOSIS — H0288B Meibomian gland dysfunction left eye, upper and lower eyelids: Secondary | ICD-10-CM | POA: Diagnosis not present

## 2022-04-01 DIAGNOSIS — Z1231 Encounter for screening mammogram for malignant neoplasm of breast: Secondary | ICD-10-CM

## 2022-04-01 DIAGNOSIS — H2513 Age-related nuclear cataract, bilateral: Secondary | ICD-10-CM | POA: Diagnosis not present

## 2022-04-01 DIAGNOSIS — H0288A Meibomian gland dysfunction right eye, upper and lower eyelids: Secondary | ICD-10-CM | POA: Diagnosis not present

## 2022-04-01 DIAGNOSIS — H1045 Other chronic allergic conjunctivitis: Secondary | ICD-10-CM | POA: Diagnosis not present

## 2022-04-01 MED ORDER — HYDROXYZINE HCL 10 MG PO TABS
ORAL_TABLET | ORAL | 1 refills | Status: DC
  Filled 2022-04-01: qty 30, 5d supply, fill #0
  Filled 2022-05-02: qty 30, 5d supply, fill #1

## 2022-04-10 ENCOUNTER — Encounter: Payer: Self-pay | Admitting: Family Medicine

## 2022-04-10 ENCOUNTER — Other Ambulatory Visit (HOSPITAL_BASED_OUTPATIENT_CLINIC_OR_DEPARTMENT_OTHER): Payer: Self-pay

## 2022-04-10 ENCOUNTER — Telehealth (INDEPENDENT_AMBULATORY_CARE_PROVIDER_SITE_OTHER): Payer: 59 | Admitting: Family Medicine

## 2022-04-10 DIAGNOSIS — L237 Allergic contact dermatitis due to plants, except food: Secondary | ICD-10-CM

## 2022-04-10 MED ORDER — PREDNISONE 20 MG PO TABS
ORAL_TABLET | ORAL | 0 refills | Status: DC
  Filled 2022-04-10: qty 18, 10d supply, fill #0

## 2022-04-10 NOTE — Patient Instructions (Signed)
Poison Ivy Dermatitis Poison ivy dermatitis is redness and soreness of the skin caused by chemicals in the leaves of the poison ivy plant. You may have very bad itching, swelling, a rash, and blisters. What are the causes? Touching a poison ivy plant. Touching something that has the chemical on it. This may include animals or objects that have come in contact with the plant. What increases the risk? Going outdoors often in wooded or marshy areas. Going outdoors without wearing protective clothing, such as closed shoes, long pants, and a long-sleeved shirt. What are the signs or symptoms?  Skin redness. Very bad itching. A rash that often includes bumps and blisters. The rash usually appears 48 hours after exposure, if you have been exposed before. If this is the first time you have been exposed, the rash may not appear until a week after exposure. Swelling. This may occur if the reaction is very bad. Symptoms usually last for 1-2 weeks. The first time you develop this condition, symptoms may last 3-4 weeks. How is this treated? This condition may be treated with: Hydrocortisone cream or calamine lotion to relieve itching. Oatmeal baths to soothe the skin. Medicines, such as over-the-counter antihistamine tablets. Oral steroid medicine for more severe reactions. Follow these instructions at home: Medicines Take or apply over-the-counter and prescription medicines only as told by your doctor. Use hydrocortisone cream or calamine lotion as needed to help with itching. General instructions Do not scratch or rub your skin. Put a cold, wet cloth (cold compress) on the affected areas or take baths in cool water. This will help with itching. Avoid hot baths and showers. Take oatmeal baths as needed. Use colloidal oatmeal. You can get this at a pharmacy or grocery store. Follow the instructions on the package. While you have the rash, wash your clothes right after you wear them. Keep all  follow-up visits as told by your health care provider. This is important. How is this prevented?  Know what poison ivy looks like, so you can avoid it. This plant has three leaves with flowering branches on a single stem. The leaves are glossy. The leaves have uneven edges that come to a point at the front. If you touch poison ivy, wash your skin with soap and water right away. Be sure to wash under your fingernails. When hiking or camping, wear long pants, a long-sleeved shirt, tall socks, and hiking boots. You can also use a lotion on your skin that helps to prevent contact with poison ivy. If you think that your clothes or outdoor gear came in contact with poison ivy, rinse them off with a garden hose before you bring them inside your house. When doing yard work or gardening, wear gloves, long sleeves, long pants, and boots. Wash your garden tools and gloves if they come in contact with poison ivy. If you think that your pet has come into contact with poison ivy, wash him or her with pet shampoo and water. Make sure to wear gloves while washing your pet. Contact a doctor if: You have open sores in the rash area. You have more redness, swelling, or pain in the rash area. You have redness that spreads beyond the rash area. You have fluid, blood, or pus coming from the rash area. You have a fever. You have a rash over a large area of your body. You have a rash on your eyes, mouth, or genitals. Your rash does not get better after a few weeks. Get help right away   if: Your face swells or your eyes swell shut. You have trouble breathing. You have trouble swallowing. These symptoms may be an emergency. Do not wait to see if the symptoms will go away. Get medical help right away. Call your local emergency services (911 in the [place] Do not drive yourself to the hospital. Summary Poison ivy dermatitis is redness and soreness of the skin caused by chemicals in the leaves of the poison ivy  plant. You may have skin redness, very bad itching, swelling, and a rash. Do not scratch or rub your skin. Take or apply over-the-counter and prescription medicines only as told by your doctor. This information is not intended to replace advice given to you by your health care provider. Make sure you discuss any questions you have with your health care provider. Document Revised: 07/23/2021 Document Reviewed: 07/23/2021 Elsevier Patient Education  2023 Elsevier Inc.  

## 2022-04-10 NOTE — Progress Notes (Signed)
VIRTUAL VISIT VIA VIDEO  I connected with Michele [name] on 04/10/22 at 11:00 AM EDT by a video enabled telemedicine application and verified that I am speaking with the correct person using two identifiers. Location patient: Home Location provider: [facility], Office Persons participating in the virtual visit: Patient, Dr. Raoul Pitch and Cyndra Numbers, CMA  I discussed the limitations of evaluation and management by telemedicine and the availability of in person appointments. The patient expressed understanding and agreed to proceed.   Michele [name] , 1966-07-29, 56 y.o., female MRN: [id number] Patient Care Team    Relationship Specialty Notifications Start End  Ma Hillock, DO PCP - General Family Medicine  12/07/21   Dian Queen, MD Consulting Physician Obstetrics and Gynecology  12/07/21   Mansouraty, Telford Nab., MD Consulting Physician Gastroenterology  12/07/21   Iran Ouch, MD Referring Physician Ophthalmology  12/07/21   Melvenia Beam, MD Consulting Physician Neurology  12/07/21   Leanor Kail, [place] Physician Assistant Cardiology  12/07/21     Chief Complaint  Patient presents with   Poison Oak    Pt woke up Fri with spots on her face and neck. Used hydrocortisone cream Fri- Sun.  On Sun night switched to Clobetasol ointment. Since Sunday spots on 1 finger left hand, palm of left hand, left knee, right hip, and left arm. Her face and neck are improved.    Subjective: Pt presents for an OV with complaints of rash after exposure to poison oak approximately 6 days ago.  She has been using steroid cream on the areas to help with symptoms.  She states it continues to spread over her body now on her hands, palm, hip, arm, face and neck.  She states she is allergic to poison ivy and oak.    12/07/2021   10:11 AM 01/12/2020    4:11 PM  Depression screen PHQ 2/9  Decreased Interest 0 3  Down, Depressed, Hopeless 0 2  PHQ - 2 Score 0 5    Allergies   Allergen Reactions   Lipitor [Atorvastatin Calcium] Other (See Comments)    Memory issue; myalgia   Celebrex [Celecoxib] Rash   Social History   Social History Narrative   Marital status/children/pets: 2 children.    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:      -Wears a bicycle helmet riding a bike: Yes     -smoke alarm in the home:Yes     - wears seatbelt: Yes     - Feels safe in their relationships: Yes      Past Medical History:  Diagnosis Date   Abdominal bloating    Anemia    Anxiety    Arthritis    Chicken pox    Chronic pain 11/20/2021   DDD (degenerative disc disease), cervical    Depression    Dorsalgia    Glaucoma    Hypercholesterolemia    Intestinal gas excretion    Migraine 08/17/2020   Nonallopathic lesion of cervical region 01/23/2021   l areas are chronic   Patient tolerated the procedure well with improvement in symptoms  Patient given exercises, stretches and lifestyle modifications  See medications in patient instructions if given  Patient will follow up in 4-8 weeks   Peptic ulcer    Scapular dyskinesis 01/23/2021   Scarlet fever    Sequelae of other specified infectious and parasitic diseases 11/20/2021   Skin cancer    Tick bite 02/22/2018   UTI (urinary  tract infection)    Yeast infection    Past Surgical History:  Procedure Laterality Date   BASAL CELL CARCINOMA EXCISION     BREAST BIOPSY Left 1992   Duct removal (No scar seen)    BREAST LUMPECTOMY Left 1992   COLONOSCOPY  07/2021   DACRORHINOCYSTOTOMY  10/2020   LEFT HEART CATH AND CORONARY ANGIOGRAPHY N/A 01/16/2018   Procedure: LEFT HEART CATH AND CORONARY ANGIOGRAPHY;  Surgeon: Nelva Bush, MD;  Location: [facility];  Service: Cardiovascular;  Laterality: N/A;   UPPER GASTROINTESTINAL ENDOSCOPY  07/2021   Family History  Problem Relation Age of Onset   Hypertension Mother    Diabetes Mother    Diverticulitis Mother    Heart failure Mother    Hyperlipidemia Father    Diabetes  Father    Heart attack Father    Arrhythmia Father    Heart attack Sister    Obesity Sister    Arthritis Sister    Asthma Sister    Alcohol abuse Brother    Leukemia Maternal Grandmother    Diabetes Maternal Grandfather    Diabetes Paternal Grandmother    Heart attack Paternal Grandfather    Heart disease Paternal Grandfather    Stroke Maternal Uncle    Pancreatic cancer Maternal Uncle    Liver disease Other        Paternal great aunt   Breast cancer Neg Hx    Colon cancer Neg Hx    Esophageal cancer Neg Hx    Inflammatory bowel disease Neg Hx    Rectal cancer Neg Hx    Stomach cancer Neg Hx    Allergies as of 04/10/2022       Reactions   Lipitor [atorvastatin Calcium] Other (See Comments)   Memory issue; myalgia   Celebrex [celecoxib] Rash        Medication List        Accurate as of April 10, 2022  4:54 PM. If you have any questions, ask your nurse or doctor.          STOP taking these medications    dicyclomine 10 MG capsule Commonly known as: BENTYL Stopped by: Howard Pouch, DO   meloxicam 15 MG tablet Commonly known as: MOBIC Stopped by: Howard Pouch, DO   methylPREDNISolone 4 MG Tbpk tablet Commonly known as: MEDROL DOSEPAK Stopped by: Howard Pouch, DO       TAKE these medications    aspirin EC 81 MG tablet Take 1 tablet by mouth every other day.   FLUoxetine 10 MG capsule Commonly known as: PROZAC TAKE 1 CAPSULE BY MOUTH ONCE DAILY ALTERNATING WITH 2 CAPSULES EVERY OTHER DAY 90 days What changed:  how much to take how to take this when to take this   gabapentin 100 MG capsule Commonly known as: NEURONTIN Take 2 capsules (200 mg total) by mouth 2 (two) times daily. What changed: Another medication with the same name was removed. Continue taking this medication, and follow the directions you see here. Changed by: Howard Pouch, DO   hydrOXYzine 10 MG tablet Commonly known as: ATARAX Take 1-2 tablets (10-20 mg total) by mouth 3  (three) times daily as needed for itching.   predniSONE 20 MG tablet Commonly known as: DELTASONE Take 3 tablets (60 mg) by mouth daily for 3 days, than 2 tablets (40 mg) daily for 3 days, then 1 tablet (20 mg) for 2 days, then take half a tablet (10 mg) for 2 days. Started by: Joseph Art  Jaeley Wiker, DO   progesterone 100 MG capsule Commonly known as: Prometrium Take 1 tablet by mouth every night at bedtime. Increase to 2 capsules at bedtime prior to cycle. What changed: Another medication with the same name was removed. Continue taking this medication, and follow the directions you see here. Changed by: Howard Pouch, DO   saccharomyces boulardii 250 MG capsule Commonly known as: Florastor Take 1 capsule (250 mg total) by mouth 2 (two) times daily.   Vitamin D3 25 MCG/SPRAY Liqd Take 250 mcg by mouth daily.        All past medical history, surgical history, allergies, family history, immunizations andmedications were updated in the EMR today and reviewed under the history and medication portions of their EMR.     ROS Negative, with the exception of above mentioned in HPI   Objective:  There were no vitals taken for this visit. There is no height or weight on file to calculate BMI. Gen: Afebrile. No acute distress.  HENT: AT. [place].  No mouth lesions.   Eyes: Without swelling or irritation Skin: Blister ration small patches present Neuro: Alert. Oriented x3 Psych: Normal affect, dress and demeanor. Normal speech. Normal thought content and judgment..    No results found. No results found. No results found for this or any previous visit (from the past 24 hour(s)).  Assessment/Plan: ANDEE CHIVERS is a 57 y.o. female present for OV for  1. Poison oak dermatitis Discussed preventative measures. No alarm features on today's exam. Prednisone taper prescribed.  Patient will monitor for any rebound rash after prednisone is completed. Follow-up as needed  Reviewed expectations re:  course of current medical issues. Discussed self-management of symptoms. Outlined signs and symptoms indicating need for more acute intervention. Patient verbalized understanding and all questions were answered. Patient received an After-Visit Summary.    No orders of the defined types were placed in this encounter.  Meds ordered this encounter  Medications   predniSONE (DELTASONE) 20 MG tablet    Sig: Take 3 tablets (60 mg) by mouth daily for 3 days, than 2 tablets (40 mg) daily for 3 days, then 1 tablet (20 mg) for 2 days, then take half a tablet (10 mg) for 2 days.    Dispense:  18 tablet    Refill:  0   Referral Orders  No referral(s) requested today     Note is dictated utilizing voice recognition software. Although note has been proof read prior to signing, occasional typographical errors still can be missed. If any questions arise, please do not hesitate to call for verification.   electronically signed by:  Howard Pouch, DO  [place]

## 2022-05-02 ENCOUNTER — Other Ambulatory Visit (HOSPITAL_BASED_OUTPATIENT_CLINIC_OR_DEPARTMENT_OTHER): Payer: Self-pay

## 2022-05-03 ENCOUNTER — Encounter: Payer: Self-pay | Admitting: Family Medicine

## 2022-05-03 ENCOUNTER — Telehealth (INDEPENDENT_AMBULATORY_CARE_PROVIDER_SITE_OTHER): Payer: 59 | Admitting: Family Medicine

## 2022-05-03 DIAGNOSIS — M792 Neuralgia and neuritis, unspecified: Secondary | ICD-10-CM

## 2022-05-03 DIAGNOSIS — J3489 Other specified disorders of nose and nasal sinuses: Secondary | ICD-10-CM

## 2022-05-03 NOTE — Patient Instructions (Signed)
No follow-ups on file.        Great to see you today.  I have refilled the medication(s) we provide.   If labs were collected, we will inform you of lab results once received either by echart message or telephone call.   - echart message- for normal results that have been seen by the patient already.   - telephone call: abnormal results or if patient has not viewed results in their echart.  

## 2022-05-03 NOTE — Progress Notes (Signed)
VIRTUAL VISIT VIA VIDEO  I connected with Michele [name] on 05/03/22 at  8:20 AM EDT by a video enabled telemedicine application and verified that I am speaking with the correct person using two identifiers. Location patient: Home Location provider: [facility], Office Persons participating in the virtual visit: Patient, Dr. Raoul Pitch and Cyndra Numbers, CMA  I discussed the limitations of evaluation and management by telemedicine and the availability of in person appointments. The patient expressed understanding and agreed to proceed.     Michele [name] , 1966-04-09, 56 y.o., female MRN: [id number] Patient Care Team    Relationship Specialty Notifications Start End  Ma Hillock, DO PCP - General Family Medicine  12/07/21   Dian Queen, MD Consulting Physician Obstetrics and Gynecology  12/07/21   Mansouraty, Telford Nab., MD Consulting Physician Gastroenterology  12/07/21   Iran Ouch, MD Referring Physician Ophthalmology  12/07/21   Melvenia Beam, MD Consulting Physician Neurology  12/07/21   Leanor Kail, Binger Physician Assistant Cardiology  12/07/21     Chief Complaint  Patient presents with   Headache    Pt c/o HA qhs above left eyebrow x 2 mos     Subjective: Pt presents for an OV with complaints of headache of sting over the last 2 months she reports the discomfort only occurs when she is laying down.  It occurs in her left temple and over her left eyebrow.  She sometimes will feel it in her right temple and across her forehead.  She denies any visual changes.  She recently had an appointment with her eye doctor in June and there were no concerns.  This has been present since she had COVID, but worse over the last 2 months.  She has tried anti-inflammatories such as Advil which have not been helpful.  She does not take a sinus medicine.  She states the pain resolves when she sits up or stands up.     12/07/2021   10:11 AM 01/12/2020    4:11 PM   Depression screen PHQ 2/9  Decreased Interest 0 3  Down, Depressed, Hopeless 0 2  PHQ - 2 Score 0 5    Allergies  Allergen Reactions   Lipitor [Atorvastatin Calcium] Other (See Comments)    Memory issue; myalgia   Celebrex [Celecoxib] Rash   Social History   Social History Narrative   Marital status/children/pets: 2 children.    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:      -Wears a bicycle helmet riding a bike: Yes     -smoke alarm in the home:Yes     - wears seatbelt: Yes     - Feels safe in their relationships: Yes      Past Medical History:  Diagnosis Date   Abdominal bloating    Anemia    Anxiety    Arthritis    Chicken pox    Chronic pain 11/20/2021   DDD (degenerative disc disease), cervical    Depression    Dorsalgia    Glaucoma    Hypercholesterolemia    Intestinal gas excretion    Migraine 08/17/2020   Nonallopathic lesion of cervical region 01/23/2021   l areas are chronic   Patient tolerated the procedure well with improvement in symptoms  Patient given exercises, stretches and lifestyle modifications  See medications in patient instructions if given  Patient will follow up in 4-8 weeks   Peptic ulcer    Scapular dyskinesis 01/23/2021  Scarlet fever    Sequelae of other specified infectious and parasitic diseases 11/20/2021   Skin cancer    Tick bite 02/22/2018   UTI (urinary tract infection)    Yeast infection    Past Surgical History:  Procedure Laterality Date   BASAL CELL CARCINOMA EXCISION     BREAST BIOPSY Left 1992   Duct removal (No scar seen)    BREAST LUMPECTOMY Left 1992   COLONOSCOPY  07/2021   DACRORHINOCYSTOTOMY  10/2020   LEFT HEART CATH AND CORONARY ANGIOGRAPHY N/A 01/16/2018   Procedure: LEFT HEART CATH AND CORONARY ANGIOGRAPHY;  Surgeon: Nelva Bush, MD;  Location: [facility];  Service: Cardiovascular;  Laterality: N/A;   UPPER GASTROINTESTINAL ENDOSCOPY  07/2021   Family History  Problem Relation Age of Onset    Hypertension Mother    Diabetes Mother    Diverticulitis Mother    Heart failure Mother    Hyperlipidemia Father    Diabetes Father    Heart attack Father    Arrhythmia Father    Heart attack Sister    Obesity Sister    Arthritis Sister    Asthma Sister    Alcohol abuse Brother    Leukemia Maternal Grandmother    Diabetes Maternal Grandfather    Diabetes Paternal Grandmother    Heart attack Paternal Grandfather    Heart disease Paternal Grandfather    Stroke Maternal Uncle    Pancreatic cancer Maternal Uncle    Liver disease Other        Paternal great aunt   Breast cancer Neg Hx    Colon cancer Neg Hx    Esophageal cancer Neg Hx    Inflammatory bowel disease Neg Hx    Rectal cancer Neg Hx    Stomach cancer Neg Hx    Allergies as of 05/03/2022       Reactions   Lipitor [atorvastatin Calcium] Other (See Comments)   Memory issue; myalgia   Celebrex [celecoxib] Rash        Medication List        Accurate as of May 03, 2022  5:17 PM. If you have any questions, ask your nurse or doctor.          STOP taking these medications    predniSONE 20 MG tablet Commonly known as: DELTASONE Stopped by: Howard Pouch, DO   saccharomyces boulardii 250 MG capsule Commonly known as: Florastor Stopped by: Howard Pouch, DO       TAKE these medications    aspirin EC 81 MG tablet Take 1 tablet by mouth every other day.   FLUoxetine 10 MG capsule Commonly known as: PROZAC TAKE 1 CAPSULE BY MOUTH ONCE DAILY ALTERNATING WITH 2 CAPSULES EVERY OTHER DAY 90 days What changed:  how much to take how to take this when to take this   gabapentin 100 MG capsule Commonly known as: NEURONTIN Take 2 capsules (200 mg total) by mouth 2 (two) times daily.   hydrOXYzine 10 MG tablet Commonly known as: ATARAX Take 1-2 tablets (10-20 mg total) by mouth 3 (three) times daily as needed for itching.   progesterone 100 MG capsule Commonly known as: Prometrium Take 1 tablet by mouth  every night at bedtime. Increase to 2 capsules at bedtime prior to cycle.   Vitamin D3 25 MCG/SPRAY Liqd Take 250 mcg by mouth daily.        All past medical history, surgical history, allergies, family history, immunizations andmedications were updated in the EMR today  and reviewed under the history and medication portions of their EMR.     ROS Negative, with the exception of above mentioned in HPI   Objective:  There were no vitals taken for this visit. There is no height or weight on file to calculate BMI. Physical Exam Vitals and nursing note reviewed.  Constitutional:      General: She is not in acute distress.    Appearance: Normal appearance. She is normal weight. She is not ill-appearing or toxic-appearing.  HENT:     Head: Normocephalic and atraumatic.  Eyes:     Extraocular Movements: Extraocular movements intact.     Conjunctiva/sclera: Conjunctivae normal.     Pupils: Pupils are equal, round, and reactive to light.  Cardiovascular:     Rate and Rhythm: Normal rate and regular rhythm.  Musculoskeletal:     Right lower leg: No edema.     Left lower leg: No edema.  Skin:    Findings: No rash.  Neurological:     Mental Status: She is alert and oriented to person, place, and time. Mental status is at baseline.  Psychiatric:        Mood and Affect: Mood normal.        Behavior: Behavior normal.        Thought Content: Thought content normal.        Judgment: Judgment normal.     No results found. No results found. No results found for this or any previous visit (from the past 24 hour(s)).  Assessment/Plan: RAVONDA BRECHEEN is a 56 y.o. female present for OV for  Sinus pain/neuralgia Uncertain etiology of patient's symptoms.  It is a positional discomfort and not present when upright.  We discussed differential diagnosis: Including ophthalmic etiologies, temporal arteritis, sinus etiologies.  We will start with a sinus x-ray and a sed rate.  Would like to see  her in the office if needing follow-up after results, so that an exam can be completed. - DG Sinuses Complete; Future - Sedimentation rate; Future  Reviewed expectations re: course of current medical issues. Discussed self-management of symptoms. Outlined signs and symptoms indicating need for more acute intervention. Patient verbalized understanding and all questions were answered. Patient received an After-Visit Summary.    Orders Placed This Encounter  Procedures   DG Sinuses Complete   Sedimentation rate   No orders of the defined types were placed in this encounter.  Referral Orders  No referral(s) requested today     Note is dictated utilizing voice recognition software. Although note has been proof read prior to signing, occasional typographical errors still can be missed. If any questions arise, please do not hesitate to call for verification.   electronically signed by:  Howard Pouch, DO  [place]

## 2022-05-05 ENCOUNTER — Ambulatory Visit (HOSPITAL_BASED_OUTPATIENT_CLINIC_OR_DEPARTMENT_OTHER)
Admission: RE | Admit: 2022-05-05 | Discharge: 2022-05-05 | Disposition: A | Discharge status: Home / Self Care | Payer: 59 | Source: Ambulatory Visit | Attending: Family Medicine | Admitting: Family Medicine

## 2022-05-05 DIAGNOSIS — J3489 Other specified disorders of nose and nasal sinuses: Secondary | ICD-10-CM | POA: Diagnosis not present

## 2022-05-05 DIAGNOSIS — J329 Chronic sinusitis, unspecified: Secondary | ICD-10-CM | POA: Diagnosis not present

## 2022-05-06 ENCOUNTER — Telehealth: Payer: Self-pay | Admitting: Family Medicine

## 2022-05-06 NOTE — Telephone Encounter (Signed)
Spoke with pt regarding labs and instructions.   

## 2022-05-06 NOTE — Telephone Encounter (Signed)
Please inform patient her Sinus x-ray is read as normal. Her frontal sinus cavity does have been to be larger on the left side of her forehead, compared to the right.  This is not worrisome, but possibly could be creating more sinus pressure located in this area.  We will wait on sed rate result before making plan.

## 2022-05-07 ENCOUNTER — Inpatient Hospital Stay: Admission: RE | Admit: 2022-05-07 | Payer: 59 | Source: Ambulatory Visit

## 2022-05-13 ENCOUNTER — Other Ambulatory Visit (INDEPENDENT_AMBULATORY_CARE_PROVIDER_SITE_OTHER): Payer: 59

## 2022-05-13 ENCOUNTER — Telehealth: Payer: Self-pay | Admitting: Family Medicine

## 2022-05-13 DIAGNOSIS — J3489 Other specified disorders of nose and nasal sinuses: Secondary | ICD-10-CM | POA: Diagnosis not present

## 2022-05-13 DIAGNOSIS — M792 Neuralgia and neuritis, unspecified: Secondary | ICD-10-CM

## 2022-05-13 LAB — SEDIMENTATION RATE: Sed Rate: 12 mm/hr (ref 0–30)

## 2022-05-13 NOTE — Telephone Encounter (Signed)
Please inform patient her inflammatory marker is normal. I am not certain what could be causing her symptoms.  My next suggestion would be to refer to neurology.   If she would like referral please place this for her.

## 2022-05-14 ENCOUNTER — Ambulatory Visit (INDEPENDENT_AMBULATORY_CARE_PROVIDER_SITE_OTHER): Payer: 59 | Admitting: Sports Medicine

## 2022-05-14 ENCOUNTER — Ambulatory Visit (INDEPENDENT_AMBULATORY_CARE_PROVIDER_SITE_OTHER): Payer: 59

## 2022-05-14 ENCOUNTER — Other Ambulatory Visit (HOSPITAL_BASED_OUTPATIENT_CLINIC_OR_DEPARTMENT_OTHER): Payer: Self-pay

## 2022-05-14 VITALS — BP 134/84 | HR 64 | Ht 62.0 in | Wt 138.0 lb

## 2022-05-14 DIAGNOSIS — M25512 Pain in left shoulder: Secondary | ICD-10-CM

## 2022-05-14 DIAGNOSIS — M79632 Pain in left forearm: Secondary | ICD-10-CM

## 2022-05-14 DIAGNOSIS — M545 Low back pain, unspecified: Secondary | ICD-10-CM

## 2022-05-14 DIAGNOSIS — M542 Cervicalgia: Secondary | ICD-10-CM

## 2022-05-14 DIAGNOSIS — M25531 Pain in right wrist: Secondary | ICD-10-CM

## 2022-05-14 MED ORDER — MELOXICAM 15 MG PO TABS
15.0000 mg | ORAL_TABLET | Freq: Every day | ORAL | 0 refills | Status: DC
  Filled 2022-05-14: qty 30, 30d supply, fill #0

## 2022-05-14 MED ORDER — CYCLOBENZAPRINE HCL 5 MG PO TABS
5.0000 mg | ORAL_TABLET | Freq: Every day | ORAL | 0 refills | Status: DC
  Filled 2022-05-14: qty 20, 20d supply, fill #0

## 2022-05-14 NOTE — Telephone Encounter (Addendum)
LVM for pt to CB regarding results.  

## 2022-05-14 NOTE — Patient Instructions (Addendum)
Good to see you  - Start meloxicam 15 mg daily x2 weeks.  If still having pain after 2 weeks, complete 3rd-week of meloxicam. May use remaining meloxicam as needed once daily for pain control.  Do not to use additional NSAIDs while taking meloxicam.  May use Tylenol [phone] mg 2 to 3 times a day for breakthrough pain. Flexeril 5 -10 mg at night as needed for muscle spasms Neck and shoulder HEP 3 week follow up
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
## 2022-05-15 ENCOUNTER — Telehealth: Payer: Self-pay

## 2022-05-15 NOTE — Telephone Encounter (Signed)
Please advise 

## 2022-05-15 NOTE — Telephone Encounter (Signed)
Noted  

## 2022-05-15 NOTE — Telephone Encounter (Signed)
LVM for pt to CB regarding results.  

## 2022-05-15 NOTE — Telephone Encounter (Signed)
Reason for Referral Request:  next steps in plan of care  Has patient been seen PCP for this complaint? Yes   No,  please schedule patient for appointment for complaint.  Patient scheduled on:   Yes, please find out following information.  Referral for which specialty:  Neurology  Preferred office/provider: Dr. Jaynee Eagles

## 2022-05-15 NOTE — Telephone Encounter (Signed)
Per telephone note earlier this week-  "Please inform patient her inflammatory marker is normal. I am not certain what could be causing her symptoms.  My next suggestion would be to refer to neurology.   If she would like referral please place this for her"

## 2022-05-15 NOTE — Addendum Note (Signed)
Addended by: Kavin Leech on: 05/15/2022 03:27 PM   Modules accepted: Orders

## 2022-05-16 ENCOUNTER — Ambulatory Visit
Admission: RE | Admit: 2022-05-16 | Discharge: 2022-05-16 | Disposition: A | Discharge status: Home / Self Care | Payer: 59 | Source: Ambulatory Visit | Attending: Family Medicine | Admitting: Family Medicine

## 2022-05-16 DIAGNOSIS — Z1231 Encounter for screening mammogram for malignant neoplasm of breast: Secondary | ICD-10-CM

## 2022-05-18 ENCOUNTER — Encounter: Payer: Self-pay | Admitting: Family Medicine

## 2022-05-20 ENCOUNTER — Other Ambulatory Visit: Payer: Self-pay | Admitting: Family Medicine

## 2022-05-20 ENCOUNTER — Ambulatory Visit: Payer: 59 | Admitting: Family Medicine

## 2022-05-20 ENCOUNTER — Telehealth: Payer: Self-pay | Admitting: Family Medicine

## 2022-05-20 DIAGNOSIS — R928 Other abnormal and inconclusive findings on diagnostic imaging of breast: Secondary | ICD-10-CM

## 2022-05-20 NOTE — Telephone Encounter (Signed)
Sent patient mychart note with recommendations and offered appt with Dr. Georgina Snell.
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
## 2022-05-27 ENCOUNTER — Ambulatory Visit: Payer: 59

## 2022-05-27 ENCOUNTER — Ambulatory Visit
Admission: RE | Admit: 2022-05-27 | Discharge: 2022-05-27 | Disposition: A | Discharge status: Home / Self Care | Payer: 59 | Source: Ambulatory Visit | Attending: Family Medicine | Admitting: Family Medicine

## 2022-05-27 DIAGNOSIS — R928 Other abnormal and inconclusive findings on diagnostic imaging of breast: Secondary | ICD-10-CM

## 2022-05-29 NOTE — Telephone Encounter (Signed)
Left message for patient to call back for possible appt today, 05/29/2022, at 1:30pm.

## 2022-05-29 NOTE — Telephone Encounter (Signed)
Pt will send mychart with time for 08/2. Gave pt available times
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
## 2022-06-01 ENCOUNTER — Other Ambulatory Visit (HOSPITAL_BASED_OUTPATIENT_CLINIC_OR_DEPARTMENT_OTHER): Payer: Self-pay

## 2022-06-03 ENCOUNTER — Other Ambulatory Visit (HOSPITAL_BASED_OUTPATIENT_CLINIC_OR_DEPARTMENT_OTHER): Payer: Self-pay

## 2022-06-03 ENCOUNTER — Ambulatory Visit (INDEPENDENT_AMBULATORY_CARE_PROVIDER_SITE_OTHER): Payer: 59 | Admitting: Family Medicine

## 2022-06-03 ENCOUNTER — Encounter: Payer: Self-pay | Admitting: Family Medicine

## 2022-06-03 VITALS — BP 120/80 | HR 96 | Ht 62.0 in | Wt 137.0 lb

## 2022-06-03 DIAGNOSIS — M5010 Cervical disc disorder with radiculopathy, unspecified cervical region: Secondary | ICD-10-CM | POA: Diagnosis not present

## 2022-06-03 DIAGNOSIS — M542 Cervicalgia: Secondary | ICD-10-CM

## 2022-06-03 DIAGNOSIS — M25512 Pain in left shoulder: Secondary | ICD-10-CM

## 2022-06-03 MED ORDER — HYDROXYZINE HCL 10 MG PO TABS
ORAL_TABLET | ORAL | 1 refills | Status: DC
  Filled 2022-06-03: qty 30, 5d supply, fill #0

## 2022-06-03 NOTE — Patient Instructions (Addendum)
[facility] [phone] Call Today  When we receive your results we will contact you.   Increase Gabapentin to '100mg'$  in AM and '300mg'$  in PM Can continue Meloxicam Go [place]
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
## 2022-06-09 ENCOUNTER — Ambulatory Visit
Admission: RE | Admit: 2022-06-09 | Discharge: 2022-06-09 | Disposition: A | Discharge status: Home / Self Care | Payer: 59 | Source: Ambulatory Visit | Attending: Family Medicine | Admitting: Family Medicine

## 2022-06-09 DIAGNOSIS — M50222 Other cervical disc displacement at C5-C6 level: Secondary | ICD-10-CM | POA: Diagnosis not present

## 2022-06-09 DIAGNOSIS — M25512 Pain in left shoulder: Secondary | ICD-10-CM

## 2022-06-09 DIAGNOSIS — M542 Cervicalgia: Secondary | ICD-10-CM

## 2022-06-09 DIAGNOSIS — S46012A Strain of muscle(s) and tendon(s) of the rotator cuff of left shoulder, initial encounter: Secondary | ICD-10-CM | POA: Diagnosis not present

## 2022-06-10 ENCOUNTER — Encounter: Payer: Self-pay | Admitting: Family Medicine

## 2022-06-10 ENCOUNTER — Ambulatory Visit: Payer: 59 | Admitting: Family Medicine

## 2022-06-10 VITALS — BP 122/77 | HR 72 | Temp 98.2°F | Ht 62.0 in | Wt 139.0 lb

## 2022-06-10 DIAGNOSIS — R34 Anuria and oliguria: Secondary | ICD-10-CM | POA: Diagnosis not present

## 2022-06-10 DIAGNOSIS — Z85828 Personal history of other malignant neoplasm of skin: Secondary | ICD-10-CM | POA: Diagnosis not present

## 2022-06-10 DIAGNOSIS — E860 Dehydration: Secondary | ICD-10-CM | POA: Diagnosis not present

## 2022-06-10 DIAGNOSIS — L821 Other seborrheic keratosis: Secondary | ICD-10-CM | POA: Diagnosis not present

## 2022-06-10 DIAGNOSIS — L578 Other skin changes due to chronic exposure to nonionizing radiation: Secondary | ICD-10-CM | POA: Diagnosis not present

## 2022-06-10 DIAGNOSIS — D225 Melanocytic nevi of trunk: Secondary | ICD-10-CM | POA: Diagnosis not present

## 2022-06-10 DIAGNOSIS — L814 Other melanin hyperpigmentation: Secondary | ICD-10-CM | POA: Diagnosis not present

## 2022-06-10 LAB — POCT URINALYSIS DIPSTICK
Bilirubin, UA: NEGATIVE
Blood, UA: NEGATIVE
Glucose, UA: NEGATIVE
Ketones, UA: NEGATIVE
Leukocytes, UA: NEGATIVE
Nitrite, UA: NEGATIVE
Protein, UA: NEGATIVE
Spec Grav, UA: 1.01
Urobilinogen, UA: 0.2 U/dL
pH, UA: 7

## 2022-06-10 LAB — BASIC METABOLIC PANEL
BUN: 10 mg/dL (ref 6–23)
CO2: 27 mEq/L (ref 19–32)
Calcium: 9.4 mg/dL (ref 8.4–10.5)
Chloride: 102 mEq/L (ref 96–112)
Creatinine, Ser: 0.85 mg/dL (ref 0.40–1.20)
GFR: 75.71 mL/min (ref >60.00)
Glucose, Bld: 100 mg/dL — ABNORMAL HIGH (ref 70–99)
Potassium: 4.8 mEq/L (ref 3.5–5.1)
Sodium: 138 mEq/L (ref 135–145)

## 2022-06-10 NOTE — Patient Instructions (Addendum)
Dehydration, Adult Dehydration is condition in which there is not enough water or other fluids in the body. This happens when a person loses more fluids than he or she takes in. Important body parts cannot work right without the right amount of fluids. Any loss of fluids from the body can cause dehydration. Dehydration can be mild, worse, or very bad. It should be treated right away to keep it from getting very bad. What are the causes? This condition may be caused by: Conditions that cause loss of water or other fluids, such as: Watery poop (diarrhea). Vomiting. Sweating a lot. Peeing (urinating) a lot. Not drinking enough fluids, especially when you: Are ill. Are doing things that take a lot of energy to do. Other illnesses and conditions, such as fever or infection. Certain medicines, such as medicines that take extra fluid out of the body (diuretics). Lack of safe drinking water. Not being able to get enough water and food. What increases the risk? The following factors may make you more likely to develop this condition: Having a long-term (chronic) illness that has not been treated the right way, such as: Diabetes. Heart disease. Kidney disease. Being 65 years of age or older. Having a disability. Living in a place that is high above the ground or sea (high in altitude). The thinner, dried air causes more fluid loss. Doing exercises that put stress on your body for a long time. What are the signs or symptoms? Symptoms of dehydration depend on how bad it is. Mild or worse dehydration Thirst. Dry lips or dry mouth. Feeling dizzy or light-headed, especially when you stand up from sitting. Muscle cramps. Your body making: Dark pee (urine). Pee may be the color of tea. Less pee than normal. Less tears than normal. Headache. Very bad dehydration Changes in skin. Skin may: Be cold to the touch (clammy). Be blotchy or pale. Not go back to normal right after you lightly pinch  it and let it go. Little or no tears, pee, or sweat. Changes in vital signs, such as: Fast breathing. Low blood pressure. Weak pulse. Pulse that is more than 100 beats a minute when you are sitting still. Other changes, such as: Feeling very thirsty. Eyes that look hollow (sunken). Cold hands and feet. Being mixed up (confused). Being very tired (lethargic) or having trouble waking from sleep. Short-term weight loss. Loss of consciousness. How is this treated? Treatment for this condition depends on how bad it is. Treatment should start right away. Do not wait until your condition gets very bad. Very bad dehydration is an emergency. You will need to go to a hospital. Mild or worse dehydration can be treated at home. You may be asked to: Drink more fluids. Drink an oral rehydration solution (ORS). This drink helps get the right amounts of fluids and salts and minerals in the blood (electrolytes). Very bad dehydration can be treated: With fluids through an IV tube. By getting normal levels of salts and minerals in your blood. This is often done by giving salts and minerals through a tube. The tube is passed through your nose and into your stomach. By treating the root cause. Follow these instructions at home: Oral rehydration solution If told by your doctor, drink an ORS: Make an ORS. Use instructions on the package. Start by drinking small amounts, about  cup (120 mL) every 5-10 minutes. Slowly drink more until you have had the amount that your doctor said to have. Eating and drinking          Drink enough clear fluid to keep your pee pale yellow. If you were told to drink an ORS, finish the ORS first. Then, start slowly drinking other clear fluids. Drink fluids such as: Water. Do not drink only water. Doing that can make the salt (sodium) level in your body get too low. Water from ice chips you suck on. Fruit juice that you have added water to (diluted). Low-calorie sports  drinks. Eat foods that have the right amounts of salts and minerals, such as: Bananas. Oranges. Potatoes. Tomatoes. Spinach. Do not drink alcohol. Avoid: Drinks that have a lot of sugar. These include: High-calorie sports drinks. Fruit juice that you did not add water to. Soda. Caffeine. Foods that are greasy or have a lot of fat or sugar. General instructions Take over-the-counter and prescription medicines only as told by your doctor. Do not take salt tablets. Doing that can make the salt level in your body get too high. Return to your normal activities as told by your doctor. Ask your doctor what activities are safe for you. Keep all follow-up visits as told by your doctor. This is important. Contact a doctor if: You have pain in your belly (abdomen) and the pain: Gets worse. Stays in one place. You have a rash. You have a stiff neck. You get angry or annoyed (irritable) more easily than normal. You are more tired or have a harder time waking than normal. You feel: Weak or dizzy. Very thirsty. Get help right away if you have: Any symptoms of very bad dehydration. Symptoms of vomiting, such as: You cannot eat or drink without vomiting. Your vomiting gets worse or does not go away. Your vomit has blood or green stuff in it. Symptoms that get worse with treatment. A fever. A very bad headache. Problems with peeing or pooping (having a bowel movement), such as: Watery poop that gets worse or does not go away. Blood in your poop (stool). This may cause poop to look black and tarry. Not peeing in 6-8 hours. Peeing only a small amount of very dark pee in 6-8 hours. Trouble breathing. These symptoms may be an emergency. Do not wait to see if the symptoms will go away. Get medical help right away. Call your local emergency services (911 in the [place] Do not drive yourself to the hospital. Summary Dehydration is a condition in which there is not enough water or other fluids  in the body. This happens when a person loses more fluids than he or she takes in. Treatment for this condition depends on how bad it is. Treatment should be started right away. Do not wait until your condition gets very bad. Drink enough clear fluid to keep your pee pale yellow. If you were told to drink an oral rehydration solution (ORS), finish the ORS first. Then, start slowly drinking other clear fluids. Take over-the-counter and prescription medicines only as told by your doctor. Get help right away if you have any symptoms of very bad dehydration. This information is not intended to replace advice given to you by your health care provider. Make sure you discuss any questions you have with your health care provider. Document Revised: 02/13/2022 Document Reviewed: 05/20/2019 Elsevier Patient Education  2023 Elsevier Inc.  

## 2022-06-10 NOTE — Progress Notes (Signed)
Michele [name] , Oct 23, 1965, 56 y.o., female MRN: [id number] Patient Care Team    Relationship Specialty Notifications Start End  Ma Hillock, DO PCP - General Family Medicine  12/07/21   Dian Queen, MD Consulting Physician Obstetrics and Gynecology  12/07/21   Mansouraty, Telford Nab., MD Consulting Physician Gastroenterology  12/07/21   Iran Ouch, MD Referring Physician Ophthalmology  12/07/21   Melvenia Beam, MD Consulting Physician Neurology  12/07/21   Leanor Kail, [place] Physician Assistant Cardiology  12/07/21     Chief Complaint  Patient presents with   urine changes    Pt reports changes in urine output x 3 mos     Subjective: Pt presents for an OV with complaints of urinary out put change of 3 mos duration.  Associated symptoms include dry mouth.  She is drinking 60 ounces routinely of water.     12/07/2021   10:11 AM 01/12/2020    4:11 PM  Depression screen PHQ 2/9  Decreased Interest 0 3  Down, Depressed, Hopeless 0 2  PHQ - 2 Score 0 5    Allergies  Allergen Reactions   Lipitor [Atorvastatin Calcium] Other (See Comments)    Memory issue; myalgia   Celebrex [Celecoxib] Rash   Social History   Social History Narrative   Marital status/children/pets: 2 children.    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:      -Wears a bicycle helmet riding a bike: Yes     -smoke alarm in the home:Yes     - wears seatbelt: Yes     - Feels safe in their relationships: Yes      Past Medical History:  Diagnosis Date   Abdominal bloating    Anemia    Anxiety    Arthritis    Chicken pox    Chronic pain 11/20/2021   DDD (degenerative disc disease), cervical    Depression    Dorsalgia    Glaucoma    Hypercholesterolemia    Intestinal gas excretion    Migraine 08/17/2020   Nonallopathic lesion of cervical region 01/23/2021   l areas are chronic   Patient tolerated the procedure well with improvement in symptoms  Patient given exercises, stretches and  lifestyle modifications  See medications in patient instructions if given  Patient will follow up in 4-8 weeks   Peptic ulcer    Scapular dyskinesis 01/23/2021   Scarlet fever    Sequelae of other specified infectious and parasitic diseases 11/20/2021   Skin cancer    Tick bite 02/22/2018   UTI (urinary tract infection)    Yeast infection    Past Surgical History:  Procedure Laterality Date   BASAL CELL CARCINOMA EXCISION     BREAST BIOPSY Left 1992   Duct removal (No scar seen)    BREAST EXCISIONAL BIOPSY Left 1992   COLONOSCOPY  07/2021   DACRORHINOCYSTOTOMY  10/2020   LEFT HEART CATH AND CORONARY ANGIOGRAPHY N/A 01/16/2018   Procedure: LEFT HEART CATH AND CORONARY ANGIOGRAPHY;  Surgeon: Nelva Bush, MD;  Location: [facility];  Service: Cardiovascular;  Laterality: N/A;   UPPER GASTROINTESTINAL ENDOSCOPY  07/2021   Family History  Problem Relation Age of Onset   Hypertension Mother    Diabetes Mother    Diverticulitis Mother    Heart failure Mother    Hyperlipidemia Father    Diabetes Father    Heart attack Father    Arrhythmia Father    Heart attack Sister  Obesity Sister    Arthritis Sister    Asthma Sister    Alcohol abuse Brother    Leukemia Maternal Grandmother    Diabetes Maternal Grandfather    Diabetes Paternal Grandmother    Heart attack Paternal Grandfather    Heart disease Paternal Grandfather    Stroke Maternal Uncle    Pancreatic cancer Maternal Uncle    Liver disease Other        Paternal great aunt   Breast cancer Neg Hx    Colon cancer Neg Hx    Esophageal cancer Neg Hx    Inflammatory bowel disease Neg Hx    Rectal cancer Neg Hx    Stomach cancer Neg Hx    Allergies as of 06/10/2022       Reactions   Lipitor [atorvastatin Calcium] Other (See Comments)   Memory issue; myalgia   Celebrex [celecoxib] Rash        Medication List        Accurate as of June 10, 2022  9:30 AM. If you have any questions, ask your nurse or  doctor.          aspirin EC 81 MG tablet Take 1 tablet by mouth every other day.   cyclobenzaprine 5 MG tablet Commonly known as: FLEXERIL Take 1 tablet (5 mg total) by mouth at bedtime.   FLUoxetine 10 MG capsule Commonly known as: PROZAC TAKE 1 CAPSULE BY MOUTH ONCE DAILY ALTERNATING WITH 2 CAPSULES EVERY OTHER DAY 90 days What changed:  how much to take how to take this when to take this   gabapentin 100 MG capsule Commonly known as: NEURONTIN Take 2 capsules (200 mg total) by mouth 2 (two) times daily.   hydrOXYzine 10 MG tablet Commonly known as: ATARAX Take 1-2 tablets (10-20 mg total) by mouth 3 (three) times daily as needed for itching.   meloxicam 15 MG tablet Commonly known as: MOBIC Take 1 tablet (15 mg total) by mouth daily.   progesterone 100 MG capsule Commonly known as: Prometrium Take 1 tablet by mouth every night at bedtime. Increase to 2 capsules at bedtime prior to cycle.   Vitamin D3 25 MCG/SPRAY Liqd Take 250 mcg by mouth daily.        All past medical history, surgical history, allergies, family history, immunizations andmedications were updated in the EMR today and reviewed under the history and medication portions of their EMR.     Review of Systems  Constitutional:  Negative for chills, fever and malaise/fatigue.  Cardiovascular:  Negative for leg swelling.  Genitourinary:  Negative for dysuria, flank pain, frequency, hematuria and urgency.  Neurological:  Negative for dizziness and headaches.   Negative, with the exception of above mentioned in HPI   Objective:  BP 122/77   Pulse 72   Temp 98.2 F (36.8 C) (Oral)   Ht '5\' 2"'$  (1.575 m)   Wt 139 lb (63 kg)   LMP 11/29/2021   SpO2 97%   BMI 25.42 kg/m  Body mass index is 25.42 kg/m. Physical Exam Vitals and nursing note reviewed.  Constitutional:      General: She is not in acute distress.    Appearance: Normal appearance. She is normal weight. She is not ill-appearing or  toxic-appearing.  HENT:     Mouth/Throat:     Mouth: Mucous membranes are dry.  Eyes:     Extraocular Movements: Extraocular movements intact.     Conjunctiva/sclera: Conjunctivae normal.     Pupils: Pupils are  equal, round, and reactive to light.  Abdominal:     General: There is no distension.     Palpations: There is no mass.     Tenderness: There is no abdominal tenderness. There is no right CVA tenderness, left CVA tenderness, guarding or rebound.  Neurological:     Mental Status: She is alert and oriented to person, place, and time. Mental status is at baseline.  Psychiatric:        Mood and Affect: Mood normal.        Behavior: Behavior normal.        Thought Content: Thought content normal.        Judgment: Judgment normal.     No results found. No results found. Results for orders placed or performed in visit on 06/10/22 (from the past 24 hour(s))  POCT urinalysis dipstick     Status: Normal   Collection Time: 06/10/22  9:11 AM  Result Value Ref Range   Color, UA yellow    Clarity, UA clear    Glucose, UA Negative Negative   Bilirubin, UA neg    Ketones, UA neg    Spec Grav, UA 1.010 1.010 - 1.025   Blood, UA neg    pH, UA 7.0 5.0 - 8.0   Protein, UA Negative Negative   Urobilinogen, UA 0.2 0.2 or 1.0 E.U./dL   Nitrite, UA neg    Leukocytes, UA Negative Negative   Appearance     Odor      Assessment/Plan: Michele [name] is a 56 y.o. female present for OV for  Low urine output/Mild dehydration Suspect mild dehydration playing a role over the summer months.  Encouraged her to increase her water/electrolyte replacement daily, especially on weekends when working out in the sun. - Basic Metabolic Panel (BMET) - POCT urinalysis dipstick - Urinalysis w microscopic + reflex cultur Kidney function and urinalysis to be complete.  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 Could  consider uro or gynecology evaluation for bladder function if symptoms continue after Dehydration is corrected.  Reviewed expectations re: course of current medical issues. Discussed self-management of symptoms. Outlined signs and symptoms indicating need for more acute intervention. Patient verbalized understanding and all questions were answered. Patient received an After-Visit Summary.    Orders Placed This Encounter  Procedures   Basic Metabolic Panel (BMET)   Urinalysis w microscopic + reflex cultur   POCT urinalysis dipstick   No orders of the defined types were placed in this encounter.  Referral Orders  No referral(s) requested today     Note is dictated utilizing voice recognition software. Although note has been proof read prior to signing, occasional typographical errors still can be missed. If any questions arise, please do not hesitate to call for verification.   electronically signed by:  Howard Pouch, DO  [place]

## 2022-06-11 LAB — NO CULTURE INDICATED

## 2022-06-11 LAB — URINALYSIS W MICROSCOPIC + REFLEX CULTURE
Bilirubin Urine: NEGATIVE
Glucose, UA: NEGATIVE
Hgb urine dipstick: NEGATIVE
Ketones, ur: NEGATIVE
Leukocyte Esterase: NEGATIVE
Nitrites, Initial: NEGATIVE
Protein, ur: NEGATIVE
Specific Gravity, Urine: 1.016 (ref 1.001–1.035)
pH: 7.5 (ref 5.0–8.0)

## 2022-06-14 ENCOUNTER — Other Ambulatory Visit: Payer: Self-pay | Admitting: Family Medicine

## 2022-06-16 MED ORDER — GABAPENTIN 100 MG PO CAPS
200.0000 mg | ORAL_CAPSULE | Freq: Two times a day (BID) | ORAL | 0 refills | Status: DC
  Filled 2022-06-16 – 2023-05-16 (×2): qty 180, 45d supply, fill #0

## 2022-06-17 ENCOUNTER — Other Ambulatory Visit: Payer: Self-pay

## 2022-06-17 ENCOUNTER — Other Ambulatory Visit (HOSPITAL_BASED_OUTPATIENT_CLINIC_OR_DEPARTMENT_OTHER): Payer: Self-pay

## 2022-06-17 ENCOUNTER — Telehealth: Payer: Self-pay | Admitting: Family Medicine

## 2022-06-17 MED ORDER — PREDNISONE 20 MG PO TABS
40.0000 mg | ORAL_TABLET | Freq: Every day | ORAL | 0 refills | Status: DC
  Filled 2022-06-17: qty 10, 5d supply, fill #0

## 2022-06-17 MED ORDER — GABAPENTIN 100 MG PO CAPS
200.0000 mg | ORAL_CAPSULE | Freq: Two times a day (BID) | ORAL | 0 refills | Status: DC
  Filled 2022-06-17: qty 180, 45d supply, fill #0

## 2022-06-17 NOTE — Telephone Encounter (Signed)
Pt leaving for vacation tomorrow. Has finished the meloxicam ( not sure she benefited from this ) and is taking her higher dosage of gabapentin ( 2 pills PM and 1 pill AM ).  As she is still having pain, wondering what Dr. Tamala Julian would recommend. Also, pharmacy is sending a refill request for the gabapentin but we need to change the dosage.

## 2022-06-17 NOTE — Telephone Encounter (Signed)
Prescription called in and patient notified.

## 2022-06-25 ENCOUNTER — Encounter: Payer: Self-pay | Admitting: Orthopaedic Surgery

## 2022-06-25 ENCOUNTER — Ambulatory Visit (INDEPENDENT_AMBULATORY_CARE_PROVIDER_SITE_OTHER): Payer: 59 | Admitting: Orthopaedic Surgery

## 2022-06-25 DIAGNOSIS — M25512 Pain in left shoulder: Secondary | ICD-10-CM | POA: Diagnosis not present

## 2022-06-25 DIAGNOSIS — M5010 Cervical disc disorder with radiculopathy, unspecified cervical region: Secondary | ICD-10-CM | POA: Diagnosis not present
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
## 2022-06-26 ENCOUNTER — Ambulatory Visit (INDEPENDENT_AMBULATORY_CARE_PROVIDER_SITE_OTHER): Payer: 59 | Admitting: Neurology

## 2022-06-26 ENCOUNTER — Other Ambulatory Visit (HOSPITAL_BASED_OUTPATIENT_CLINIC_OR_DEPARTMENT_OTHER): Payer: Self-pay

## 2022-06-26 ENCOUNTER — Encounter: Payer: Self-pay | Admitting: Neurology

## 2022-06-26 VITALS — BP 92/60 | HR 89 | Ht 62.0 in | Wt 142.0 lb

## 2022-06-26 DIAGNOSIS — G501 Atypical facial pain: Secondary | ICD-10-CM

## 2022-06-26 DIAGNOSIS — G500 Trigeminal neuralgia: Secondary | ICD-10-CM

## 2022-06-26 DIAGNOSIS — G589 Mononeuropathy, unspecified: Secondary | ICD-10-CM

## 2022-06-26 DIAGNOSIS — G935 Compression of brain: Secondary | ICD-10-CM

## 2022-06-26 DIAGNOSIS — G509 Disorder of trigeminal nerve, unspecified: Secondary | ICD-10-CM

## 2022-06-26 MED ORDER — EMGALITY 120 MG/ML ~~LOC~~ SOAJ: SUBCUTANEOUS | 0 refills | Status: DC

## 2022-06-26 MED ORDER — BACLOFEN 10 MG PO TABS
10.0000 mg | ORAL_TABLET | Freq: Three times a day (TID) | ORAL | 3 refills | Status: DC | PRN
  Filled 2022-06-26: qty 30, 10d supply, fill #0
  Filled 2022-07-26: qty 30, 10d supply, fill #1

## 2022-06-26 MED ORDER — TOPIRAMATE 25 MG PO TABS
25.0000 mg | ORAL_TABLET | Freq: Every evening | ORAL | 3 refills | Status: DC
  Filled 2022-06-26: qty 30, 30d supply, fill #0
  Filled 2022-07-25: qty 30, 30d supply, fill #1

## 2022-06-26 MED ORDER — NURTEC 75 MG PO TBDP: 75.0000 mg | ORAL_TABLET | Freq: Every day | ORAL | 0 refills | Status: DC | PRN

## 2022-06-26 NOTE — Progress Notes (Signed)
[facility]    Provider:  Dr Jaynee Eagles Referring Provider: Ma Hillock, DO Primary Care Physician:  Howard Pouch A, DO  CC: Headaches, temple pain  06/26/2022: She is here today for headaches and radiating pain fom temple to eyebrow per Dr. Raoul Pitch. We saw patient in 2019 for multiple complaints and pain, we performed an extensive blood work investigation and MRI brain and cervical spine. Labs were all normal/negative including lyme's disease and investigations below. Since then she has had multiple other bloodwork including repeats of some of those below. 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 We could never find any causes. Daily she has tample pain, shooting in the left temple, stabbing, every now and then it comes above the eyebrow, more stabbing that it is migrainous. No vision changes, every single day and when she lays down. 4 times a weak she gets a little imbalance\d, does yoga every day.  Her left eye drips. She lives with it can be 4-7/8/ Shooting with lacrimation.   Medications tried: verapamil contraindicated.   Reviewed notes, labs and imaging from outside physicians, which showed:  Orders Placed last encounter in 2019 were all unremarkable/neg or normal except for mild cerebellar ectopia.   Procedures   MR FACE/TRIGEMINAL WO/W CM   MR BRAIN W WO CONTRAST   TSH   Sedimentation rate   B12 and Folate Panel   Tissue transglutaminase, IgA   Gliadin antibodies, serum   Heavy metals, blood   Vitamin B6   Methylmalonic acid, serum   Vitamin B1   ANA, IFA (with reflex)   C-reactive protein   Basic Metabolic Panel   B. burgdorfi Antibody   05/2022: esr normal, BUN 10 creatining 0.81  MRI 06-11-2022 showed possible left C5 radiculopathy:  Disc levels:   C2-C3:  No stenosis.   C3-C4:  No stenosis.  Trace central protrusion.  No  stenosis.   C4-C5: Left paracentral protrusion partially effaces the left ventral subarachnoid space. No significant canal or foraminal stenosis.   C5-C6:  Minimal disc bulge.  No canal or foraminal stenosis.   C6-C7:  No stenosis.   C7-T1:  No stenosis.   IMPRESSION: No high-grade stenosis. Left paracentral disc protrusion at C4-C5; correlate for C5 radicular symptoms.  2019: MRI face/trigem nerve:  FINDINGS: On the sagittal images, the cerebellar tonsils extend just below the foramen magnum but not enough to be considered a Chiari malformation.  The spinal cord is imaged quarterly to C4 and is normal.  The pituitary gland and the optic chiasm appear normal.  The cerebellum, brainstem and visible portion of the brain appears normal.  The trigeminal nerves are followed from the pons into [place] and appear normal.  A blood vessel is adjacent to the left trigeminal nerve dorsal root entry zone but it does not distort the nerve.    There is minimal mucoperiosteal thickening of some of the ethmoid air cells and the other paranasal sinuses appear normal.  The 7th/8th nerve complexes appear normal.  The orbits appear normal.    Incidental note is made of asymmetry of the transverse and sigmoid sinuses and jugular veins, larger on the right than the left.  This is a normal variant.  Flow voids are noted in the major intracerebral arteries.    IMPRESSION: This MRI of the face and trigeminal nerve region appears normal.  There  is a blood vessel adjacent to the dorsal root entry zone of the left trigeminal nerve but it does not cause any distortion.   FINDINGS:  The brain parenchyma shows no abnormal signal intensities.  No structural lesion, tumor or infarcts are noted.  The paranasal sinuses show mild chronic inflammatory changes.  The pituitary gland  appears normal.  The cerebellar tonsils are 4 to 5 mm below the foramen magnum for borderline type I Arnold-Chiari malformation with some  crowding of the craniovertebral junction.  Extra-axial brain structures appear normal.  Orbits appear unremarkable paranasal sinuses show mild chronic inflammatory changes.  The subarachnoid spaces and ventricular system appear normal.  Diffusion-weighted imaging is negative for acute ischemia.  Gradient echo images do not show significant microhemorrhages.  Thin sections through the brainstem showed normal symmetric appearance of both trigeminal nerves without any abnormal vascular loop or masses noted.  Postcontrast images do not result in abnormal areas of enhancement.      2019: MRI brain : FINDINGS:  The brain parenchyma shows no abnormal signal intensities.  No structural lesion, tumor or infarcts are noted.  The paranasal sinuses show mild chronic inflammatory changes.  The pituitary gland  appears normal.  The cerebellar tonsils are 4 to 5 mm below the foramen magnum for borderline type I Arnold-Chiari malformation with some crowding of the craniovertebral junction.  Extra-axial brain structures appear normal.  Orbits appear unremarkable paranasal sinuses show mild chronic inflammatory changes.  The subarachnoid spaces and ventricular system appear normal.  Diffusion-weighted imaging is negative for acute ischemia.  Gradient echo images do not show significant microhemorrhages.  Thin sections through the brainstem showed normal symmetric appearance of both trigeminal nerves without any abnormal vascular loop or masses noted.  Postcontrast images do not result in abnormal areas of enhancement.         IMPRESSION: Unremarkable MRI scan of the brain with and without contrast.  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  HPI 02/20/2018:  Michele [name] is a 56 y.o. female here as a referral from Dr. Raoul Pitch for trigeminal neuralgia but she says she is here to discuss multiple  Problems she thinks is related to lyme disease.  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 She had a tick bite in June. In August she started getting infrequent sharp shooting pain behind her left ear (points behind the tragus) lasts 2-3 seconds, started to become more frequent once a week. Now it is every other day twice a day. She is taking a B complex. Periodically her bottom lip wil quiver, she can feel it but no one can see it. She also has a pain poking in her temple, not as sharp as behind the ear, but sharp, also 3 seconds and then another 3 seconds the whole episode can last 30 minutes - 3 hours, no autonomic symptoms with the stabbing, the 2 pains (behind the ear and in the temple) don;t seem to be associated. Left eye was "twitching" for 45 minutes. She also has "random strange things". A month ago her forearm started hurting severely for an hour and went away. She was lying in bed later that week and her legs ached for 20 minutes then gone. Yesterday her groin hurt for 15 minutes. Odd transient incidents that happen once. Since her root canal she has intermittent belly pain and slight nausea  and awful smeeling gas. She has chest discomfort. Not sensitive to sunlight or have rashes with sunlight. 10-12 headache free days. Emgality is helping. She had a lot of side effects to the triptans, would help then rebound.   Reviewed notes, labs and imaging from outside physicians, which showed:  12/26/2016: CT head showed No acute intracranial abnormalities including mass lesion or mass effect, hydrocephalus, extra-axial fluid collection, midline shift, hemorrhage, or acute infarction, large ischemic events (personally reviewed images)  Bmp unremarkable, CMP with anemia (10.8/33.9) and platelets 407 otherwise normal.   Reviewed referring physician's notes, examination normal, she has anxiety on Prozac, she has back pain, she is been seen by GI for abdominal bloating and  lower abdominal pelvic discomfort also seen by cardiology for chest pain and shortness of breath, she had a colonoscopy, she has high cholesterol LDL 161 but she deferred treatment  Review of Systems: Patient complains of symptoms per HPI as well as the following symptoms: sleepiness, cramps, decreased energy. Pertinent negatives and positives per HPI. All others negative.   Social History   Socioeconomic History   Marital status: Married    Spouse name: Not on file   Number of children: 2   Years of education: Not on file   Highest education level: Not on file  Occupational History   Occupation: Therapist, sports    Employer: [employer]  Tobacco Use   Smoking status: Never    Passive exposure: Never   Smokeless tobacco: Never  Vaping Use   Vaping Use: Never used  Substance and Sexual Activity   Alcohol use: Yes    Alcohol/week: 5.0 standard drinks of alcohol    Types: 5 Glasses of wine per week    Comment: 1 per day   Drug use: No   Sexual activity: Yes    Partners: Male  Other Topics Concern   Not on file  Social History Narrative   Marital status/children/pets: 2 children.    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:      -Wears a bicycle helmet riding a bike: Yes     -smoke alarm in the home:Yes     - wears seatbelt: Yes     - Feels safe in their relationships: Yes      Social Determinants of Health   Financial Resource Strain: Not on file  Food Insecurity: Not on file  Transportation Needs: Not on file  Physical Activity: Not on file  Stress: Not on file  Social Connections: Not on file  Intimate Partner Violence: Not on file    Family History  Problem Relation Age of Onset   Hypertension Mother    Diabetes Mother    Diverticulitis Mother    Heart failure Mother    Hyperlipidemia Father    Diabetes Father    Heart attack Father    Arrhythmia Father    Heart attack Sister    Obesity Sister    Arthritis Sister    Asthma Sister    Alcohol abuse Brother    Stroke  Maternal Uncle    Pancreatic cancer Maternal Uncle    Leukemia Maternal Grandmother    Diabetes Maternal Grandfather    Diabetes Paternal Grandmother    Heart attack Paternal Grandfather    Heart disease Paternal Grandfather    Migraines Daughter    Liver disease Other        Paternal great aunt   Breast cancer Neg Hx    Colon cancer Neg Hx    Esophageal cancer Neg  Hx    Inflammatory bowel disease Neg Hx    Rectal cancer Neg Hx    Stomach cancer Neg Hx     Past Medical History:  Diagnosis Date   Abdominal bloating    Anemia    Anxiety    Arthritis    Chicken pox    Chronic pain 11/20/2021   DDD (degenerative disc disease), cervical    Depression    Dorsalgia    Glaucoma    Hypercholesterolemia    Intestinal gas excretion    Migraine 08/17/2020   Nonallopathic lesion of cervical region 01/23/2021   l areas are chronic   Patient tolerated the procedure well with improvement in symptoms  Patient given exercises, stretches and lifestyle modifications  See medications in patient instructions if given  Patient will follow up in 4-8 weeks   Peptic ulcer    Scapular dyskinesis 01/23/2021   Scarlet fever    Sequelae of other specified infectious and parasitic diseases 11/20/2021   Skin cancer    Tick bite 02/22/2018   UTI (urinary tract infection)    Yeast infection     Past Surgical History:  Procedure Laterality Date   BASAL CELL CARCINOMA EXCISION     BREAST BIOPSY Left 1992   Duct removal (No scar seen)    BREAST EXCISIONAL BIOPSY Left 1992   COLONOSCOPY  07/2021   DACRORHINOCYSTOTOMY  10/2020   LEFT HEART CATH AND CORONARY ANGIOGRAPHY N/A 01/16/2018   Procedure: LEFT HEART CATH AND CORONARY ANGIOGRAPHY;  Surgeon: Nelva Bush, MD;  Location: [facility];  Service: Cardiovascular;  Laterality: N/A;   UPPER GASTROINTESTINAL ENDOSCOPY  07/2021    Current Outpatient Medications  Medication Sig Dispense Refill   aspirin EC 81 MG tablet Take 1 tablet by mouth  every other day.     baclofen (LIORESAL) 10 MG tablet Take 1 tablet (10 mg total) by mouth 3 (three) times daily as needed for muscle spasms. 30 each 3   Cholecalciferol (VITAMIN D3) 25 MCG/SPRAY LIQD Take 250 mcg by mouth daily.     gabapentin (NEURONTIN) 400 MG capsule Take 400 mg by mouth at bedtime.     Galcanezumab-gnlm (EMGALITY) 120 MG/ML SOAJ First time 2 injectuons afterwards once monthly 4 mL 0   progesterone (PROMETRIUM) 100 MG capsule Take 1 tablet by mouth every night at bedtime. Increase to 2 capsules at bedtime prior to cycle. 120 capsule 2   Rimegepant Sulfate (NURTEC) 75 MG TBDP Take 75 mg by mouth daily as needed. For migraines. Take as close to onset of migraine as possible. One daily maximum. 4 tablet 0   topiramate (TOPAMAX) 25 MG tablet Take 1 tablet (25 mg total) by mouth at bedtime. 30 tablet 3   No current facility-administered medications for this visit.    Allergies as of 06/26/2022 - Review Complete 06/26/2022  Allergen Reaction Noted   Lipitor [atorvastatin calcium] Other (See Comments) 01/14/2018   Celebrex [celecoxib] Rash 12/05/2015    Vitals: BP 92/60   Pulse 89   Ht 5' 2" (1.575 m)   Wt 142 lb (64.4 kg)   LMP 11/29/2021   BMI 25.97 kg/m  Last Weight:  Wt Readings from Last 1 Encounters:  06/26/22 142 lb (64.4 kg)   Last Height:   Ht Readings from Last 1 Encounters:  06/26/22 5' 2" (1.575 m)   Physical exam: Exam: Gen: NAD, conversant, well nourised, obese, well groomed  CV: RRR, no MRG. No Carotid Bruits. No peripheral edema, warm, nontender Eyes: Conjunctivae clear without exudates or hemorrhage  Neuro: Detailed Neurologic Exam  Speech:    Speech is normal; fluent and spontaneous with normal comprehension.  Cognition:    The patient is oriented to person, place, and time;     recent and remote memory intact;     language fluent;     normal attention, concentration,     fund of knowledge Cranial Nerves:    The  pupils are equal, round, and reactive to light. The fundi are flat. Visual fields are full to finger confrontation. Extraocular movements are intact. Trigeminal sensation is intact and the muscles of mastication are normal. The face is symmetric. The palate elevates in the midline. Hearing intact. Voice is normal. Shoulder shrug is normal. The tongue has normal motion without fasciculations.   Coordination:    Normal  Gait:    normal.   Motor Observation:    No asymmetry, no atrophy, and no involuntary movements noted. Tone:    Normal muscle tone.    Posture:    Posture is normal. normal erect    Strength:    Strength is V/V in the upper and lower limbs.      Sensation: intact to LT     Reflex Exam:  DTR's:    Deep tendon reflexes in the upper and lower extremities are normal bilaterally.   Toes:    The toes are downgoing bilaterally.   Clonus:    Clonus is absent.      Assessment/Plan:  She is here today for headaches and radiating pain fom temple to eyebrow per Dr. Raoul Pitch. We saw patient in 2019 for multiple complaints and pain, we performed an extensive blood work investigation and MRI brain and cervical spine. Labs were all normal/negative including lyme's disease and investigations below. Since then she has had multiple other bloodwork including repeats of some of those below. 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 She saw a [facility] doctor and he treated with 2 weeks of doxy   - MRI brain in 2019 showed mildly descended tonsiles, rounded, not crowding the cervical junction, likely not clinically synptomatic. MRI with Trigeminal protocol showed no vascular loop or other cause of trigeminal neuralgia (TGN).  - Repeat MRI Trigeminal protocol - request [place] or Dr. Jobe Igo per patient request Bloodwork  - If we see vascular loop again I  would [facility] patient preference - Samples of emgality to see if that helps patient, other medications tired include verapamil (blood pressure too low), baclofen(can cause sedation), Topamax(prescribe low dose), tried gabapentin,  tried amitriptyline (severe sedation) - Try baclofen again (watch for sedation), Topiramate at bedtime(prescribed) - Other options include Try Botox, other medications, nerve blocks (lidocaine and marcaine), surgical decompression for vascular loop  -Emgality: first month is 2 injection and then afterwards one injection monthly  - Nurtec prn once daily as needed  Meds ordered this encounter  Medications   baclofen (LIORESAL) 10 MG tablet    Sig: Take 1 tablet (10 mg total) by mouth 3 (three) times daily as needed for muscle spasms.    Dispense:  30 each    Refill:  3   topiramate (TOPAMAX) 25 MG tablet    Sig: Take 1 tablet (25 mg  total) by mouth at bedtime.    Dispense:  30 tablet    Refill:  3   Galcanezumab-gnlm (EMGALITY) 120 MG/ML SOAJ    Sig: First time 2 injectuons afterwards once monthly    Dispense:  4 mL    Refill:  0   Rimegepant Sulfate (NURTEC) 75 MG TBDP    Sig: Take 75 mg by mouth daily as needed. For migraines. Take as close to onset of migraine as possible. One daily maximum.    Dispense:  4 tablet    Refill:  0    Orders Placed This Encounter  Procedures   MR FACE/TRIGEMINAL WO/W CM   C-reactive protein     Reviewed prior notes, labs and imaging from outside physicians, which showed:    Orders Placed last encounter in 2019 were all unremarkable/neg or normal except for mild cerebellar ectopia.   Procedures   MR FACE/TRIGEMINAL WO/W CM   MR BRAIN W WO CONTRAST   TSH   Sedimentation rate   B12 and Folate Panel   Tissue transglutaminase, IgA   Gliadin antibodies, serum   Heavy metals, blood   Vitamin B6   Methylmalonic acid, serum   Vitamin B1   ANA, IFA (with  reflex)   C-reactive protein   Basic Metabolic Panel   B. burgdorfi Antibody    MRI 06-11-2022 showed possible left C5 radiculopathy:  Disc levels:   C2-C3:  No stenosis.   C3-C4:  No stenosis.  Trace central protrusion.  No stenosis.   C4-C5: Left paracentral protrusion partially effaces the left ventral subarachnoid space. No significant canal or foraminal stenosis.   C5-C6:  Minimal disc bulge.  No canal or foraminal stenosis.   C6-C7:  No stenosis.   C7-T1:  No stenosis.   IMPRESSION: No high-grade stenosis. Left paracentral disc protrusion at C4-C5; correlate for C5 radicular symptoms.  2019: MRI face/trigem nerve:  FINDINGS: On the sagittal images, the cerebellar tonsils extend just below the foramen magnum but not enough to be considered a Chiari malformation.  The spinal cord is imaged quarterly to C4 and is normal.  The pituitary gland and the optic chiasm appear normal.  The cerebellum, brainstem and visible portion of the brain appears normal.  The trigeminal nerves are followed from the pons into [place] and appear normal.  A blood vessel is adjacent to the left trigeminal nerve dorsal root entry zone but it does not distort the nerve.    There is minimal mucoperiosteal thickening of some of the ethmoid air cells and the other paranasal sinuses appear normal.  The 7th/8th nerve complexes appear normal.  The orbits appear normal.    Incidental note is made of asymmetry of the transverse and sigmoid sinuses and jugular veins, larger on the right than the left.  This is a normal variant.  Flow voids are noted in the major intracerebral arteries.    IMPRESSION: This MRI of the face and trigeminal nerve region appears normal.  There is a blood vessel adjacent to the dorsal root entry zone of the left trigeminal nerve but it does not cause any distortion. FINDINGS:  The brain parenchyma shows no abnormal signal intensities.  No structural lesion, tumor or infarcts are  noted.  The paranasal sinuses show mild chronic inflammatory changes.  The pituitary gland  appears normal.  The cerebellar tonsils are 4 to 5 mm below the foramen magnum for borderline type I Arnold-Chiari malformation with some crowding of the craniovertebral junction.  Extra-axial brain  structures appear normal.  Orbits appear unremarkable paranasal sinuses show mild chronic inflammatory changes.  The subarachnoid spaces and ventricular system appear normal.  Diffusion-weighted imaging is negative for acute ischemia.  Gradient echo images do not show significant microhemorrhages.  Thin sections through the brainstem showed normal symmetric appearance of both trigeminal nerves without any abnormal vascular loop or masses noted.  Postcontrast images do not result in abnormal areas of enhancement.      2019: MRI brain : FINDINGS:  The brain parenchyma shows no abnormal signal intensities.  No structural lesion, tumor or infarcts are noted.  The paranasal sinuses show mild chronic inflammatory changes.  The pituitary gland  appears normal.  The cerebellar tonsils are 4 to 5 mm below the foramen magnum for borderline type I Arnold-Chiari malformation with some crowding of the craniovertebral junction.  Extra-axial brain structures appear normal.  Orbits appear unremarkable paranasal sinuses show mild chronic inflammatory changes.  The subarachnoid spaces and ventricular system appear normal.  Diffusion-weighted imaging is negative for acute ischemia.  Gradient echo images do not show significant microhemorrhages.  Thin sections through the brainstem showed normal symmetric appearance of both trigeminal nerves without any abnormal vascular loop or masses noted.  Postcontrast images do not result in abnormal areas of enhancement.         IMPRESSION: Unremarkable MRI scan of the brain with and without contrast.  Incidental changes of borderline type I Arnold-Chiari malformation and mild chronic paranasal  sinusitis noted.  Orders Placed This Encounter  Procedures   MR FACE/TRIGEMINAL WO/W CM   C-reactive protein   Cc: Dr. Kenton Kingfisher  I spent over 40 minutes of face-to-face and non-face-to-face time with patient on the  1. Left-sided trigeminal neuralgia, imaging shows possible vascular compression   2. 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 Facial pain, atypical   4. Nerve compression syndrome   5. Chiari malformation type I ([place])    diagnosis.  This included previsit chart review, lab review, study review, order entry, electronic health record documentation, patient education on the different diagnostic and therapeutic options, counseling and coordination of care, risks and benefits of management, compliance, or risk factor reduction    Sarina Ill, MD  [facility] [address] [place], [place] [address]  Phone [phone] Fax [phone]

## 2022-06-26 NOTE — Patient Instructions (Addendum)
Repeat MRI Trigeminal protocol - request [place] [facility] [place] or Dr. Jobe Igo per patient request Bloodwork  If we see vascular loop again [facility] would [facility] or [facility] Z[facility] Finders at [facility] Neurosurgery patient preference Samples of emgality to see if that helps patient, other medications include verapamil (blood pressure too low), baclofen(can cause sedation), Topamax(prescribe low dose), tried gabapentin Try Baclofen(can cause sedation), Topiramate at bedtime(prescribed), tried amitriptyline (severe sedation) Other options include Try Botox, other medications, nerve blocks (lidocaine and marcaine), surgical decompression for vascular loop  Emgality: first month is 2 injection and then afterwards one injection monthly  Nurtec prn once daily as needed   Trigeminal Neuralgia  Trigeminal neuralgia is a nerve disorder that causes severe pain on one side of the face. The pain may last from a few seconds to several minutes, but it can happen hundreds of times a day. The pain is usually only on one side of the face. Symptoms may occur for days, weeks, or months and then go away for months or years. The pain may return and be worse than before. What are the causes? This condition may be caused by: Damage or pressure to a nerve in the head that is called the trigeminal nerve. An attack can be triggered by: Talking or chewing. Putting on makeup. Washing, shaving, or touching your face. Brushing your teeth. Blasts of hot or cold air. Primary demyelinating disorders, such as multiple sclerosis. Tumors. What increases the risk? You are more likely to develop this condition if: You are 54-61 years old. You are female. What are the signs or symptoms? The main symptom of this condition is severe pain in the jaw, lips, eyes, nose, scalp, forehead, and face. How is this diagnosed? This condition is diagnosed with a physical exam. A CT scan or an MRI may be done to rule out other  conditions that can cause facial pain. How is this treated? This condition may be treated with: Measures to avoid the things that trigger your symptoms. Prescription medicines such as anticonvulsants. Procedures such as ablation, thermal, or radiation therapy. Cognitive or behavioral therapy. Complementary therapies such as: Gentle, regular exercise or yoga. Meditation. Aromatherapy. Acupuncture. Surgery. This may be done in severe cases if other medical treatment does not provide relief. It may take up to one month for treatment to start relieving the pain. Follow these instructions at home: Managing pain  Learn as much as you can about how to manage your pain. Ask your health care provider if a pain specialist would be helpful. Consider talking with a mental health care provider about how to cope with the pain. Consider joining a pain support group. General instructions Take over-the-counter and prescription medicines only as told by your health care provider. Avoid the things that trigger your symptoms. It may help to: Chew on the unaffected side of your mouth. Avoid touching your face. Avoid blasts of hot or cold air. Keep all follow-up visits. Where to find more information Facial Pain Association: facepain.org Contact a health care provider if: Your medicine is not helping your symptoms. You have side effects from the medicine used for treatment. You develop new, unexplained symptoms, such as: Double vision. Facial weakness or numbness. Changes in hearing or balance. You feel depressed. Get help right away if: Your pain is severe and is not getting better. You develop suicidal thoughts. If you ever feel like you may hurt yourself or others, or have thoughts about taking your own life, get help right away.  Go to your nearest emergency department or: Call your local emergency services (911 in the [place] Call a suicide crisis helpline, such as the [facility] at 442 134 8649 or 988 in the [address]. This is open 24 hours a day in the [place] Text the Crisis Text Line at 505-079-9634 (in the [place].). Summary Trigeminal neuralgia is a nerve disorder that causes severe pain on one side of the face. The pain may last from a few seconds to several minutes. This condition is caused by damage or pressure to a nerve in the head that is called the trigeminal nerve. Treatment may include avoiding the things that trigger your symptoms, taking medicines, or having procedures or surgery. It may take up to one month for treatment to start relieving the pain. Keep all follow-up visits. This information is not intended to replace advice given to you by your health care provider. Make sure you discuss any questions you have with your health care provider. Document Revised: 05/03/2021 Document Reviewed: 04/02/2021 Elsevier Patient Education  [place].  Baclofen Tablets What is this medication? BACLOFEN (BAK loe fen) treats muscle spasms. It works by relaxing your muscles, which reduces muscle stiffness. It belongs to a group of medications called muscle relaxants. This medicine may be used for other purposes; ask your health care provider or pharmacist if you have questions. COMMON BRAND NAME(S): ED Baclofen, Lioresal What should I tell my care team before I take this medication? They need to know if you have any of these conditions: High blood pressure History of stroke Kidney disease Mental health disease Ovarian cysts Seizures An unusual or allergic reaction to baclofen, other medications, foods, dyes or preservatives Pregnant or trying to get pregnant Breast-feeding How should I use this medication? Take this medication by mouth. Take it as directed on the prescription label. Keep taking it unless your care team tells you to stop. Stopping it too quickly can cause serious side effects. It can also make your condition worse. Talk to your care  team about the use of this medication in children. While it may be prescribed for children as young as 12 years for selected conditions, precautions do apply. Overdosage: If you think you have taken too much of this medicine contact a poison control center or emergency room at once. NOTE: This medicine is only for you. Do not share this medicine with others. What if I miss a dose? If you miss a dose, take it as soon as you can. If it is almost time for your next dose, take only that dose. Do not take double or extra doses. What may interact with this medication? Do not take this medication with any of the following: Narcotic medications for cough This medication may also interact with the following: Alcohol Antihistamines for allergy, cough and cold Certain medications for anxiety or sleep Certain medications for depression like amitriptyline, fluoxetine, sertraline Certain medications for seizures like phenobarbital, primidone General anesthetics like halothane, isoflurane, methoxyflurane, propofol Narcotic medications for pain Other medications that relax muscles Phenothiazines like chlorpromazine, mesoridazine, prochlorperazine, thioridazine This list may not describe all possible interactions. Give your health care provider a list of all the medicines, herbs, non-prescription drugs, or dietary supplements you use. Also tell them if you smoke, drink alcohol, or use illegal drugs. Some items may interact with your medicine. What should I watch for while using this medication? Visit your care team for regular checks on your progress. Tell your care team if your symptoms do  not start to get better or if they get worse. Do not suddenly stop taking this medication. You may develop a severe reaction. Your care team will tell you how much medication to take. If your care team wants you to stop the medication, the dose may be slowly lowered over time to avoid any side effects. You may get drowsy or  dizzy. Do not drive, use machinery, or do anything that needs mental alertness until you know how this medication affects you. Do not stand up or sit up quickly, especially if you are an older patient. This reduces the risk of dizzy or fainting spells. Alcohol may interfere with the effect of this medication. Avoid alcoholic drinks. If you take other medications that also cause drowsiness such as other narcotic pain medications, benzodiazepines, or other medications for sleep, you may have more side effects. Give your care team a list of all medications you use. They will tell you how much medication to take. Do not take more medication than directed. Get emergency help right away if you have trouble breathing or are unusually tired or sleepy. What side effects may I notice from receiving this medication? Side effects that you should report to your care team as soon as possible: Allergic reactions--skin rash, itching, hives, swelling of the face, lips, tongue, or throat CNS depression--slow or shallow breathing, shortness of breath, feeling faint, dizziness, confusion, trouble staying awake Side effects that usually do not require medical attention (report to your care team if they continue or are bothersome): Dizziness Drowsiness Headache Muscle weakness Nausea This list may not describe all possible side effects. Call your doctor for medical advice about side effects. You may report side effects to FDA at 1-800-FDA-1088. Where should I keep my medication? Keep out of the reach of children and pets. Store at room temperature between 20 and 25 degrees C (68 and 77 degrees F). Keep container tightly closed. Get rid of any unused medication after the expiration date. To get rid of medications that are no longer needed or have expired: Take the medication to a medication take-back program. Check with your pharmacy or law enforcement to find a location. If you cannot return the medication, check the  label or package insert to see if the medication should be thrown out in the garbage or flushed down the toilet. If you are not sure, ask your care team. If it is safe to put it in the trash, take the medication out of the container. Mix the medication with cat litter, dirt, coffee grounds, or other unwanted substance. Seal the mixture in a bag or container. Put it in the trash. NOTE: This sheet is a summary. It may not cover all possible information. If you have questions about this medicine, talk to your doctor, pharmacist, or health care provider.  2023 Elsevier/Gold Standard (2020-11-08 00:00:00) Topiramate Tablets What is this medication? TOPIRAMATE (toe PYRE a mate) prevents and controls seizures in people with epilepsy. It may also be used to prevent migraine headaches. It works by calming overactive nerves in your body. This medicine may be used for other purposes; ask your health care provider or pharmacist if you have questions. COMMON BRAND NAME(S): Topamax, Topiragen What should I tell my care team before I take this medication? They need to know if you have any of these conditions: Bleeding disorder Kidney disease Lung disease Suicidal thoughts, plans, or attempt by you or a family member An unusual or allergic reaction to topiramate, other medications, foods, dyes,  or preservatives Pregnant or trying to get pregnant Breast-feeding How should I use this medication? Take this medication by mouth with water. Take it as directed on the prescription label at the same time every day. Do not cut, crush or chew this medicine. Swallow the tablets whole. You can take it with or without food. If it upsets your stomach, take it with food. Keep taking it unless your care team tells you to stop. A special MedGuide will be given to you by the pharmacist with each prescription and refill. Be sure to read this information carefully each time. Talk to your care team about the use of this medication  in children. While it may be prescribed for children as young as 2 years for selected conditions, precautions do apply. Overdosage: If you think you have taken too much of this medicine contact a poison control center or emergency room at once. NOTE: This medicine is only for you. Do not share this medicine with others. What if I miss a dose? If you miss a dose, take it as soon as you can unless it is within 6 hours of the next dose. If it is within 6 hours of the next dose, skip the missed dose. Take the next dose at the normal time. Do not take double or extra doses. What may interact with this medication? Acetazolamide Alcohol Antihistamines for allergy, cough, and cold Aspirin and aspirin-like medications Atropine Certain medications for anxiety or sleep Certain medications for bladder problems, such as oxybutynin, tolterodine Certain medications for depression, such as amitriptyline, fluoxetine, sertraline Certain medications for Parkinson disease, such as benztropine, trihexyphenidyl Certain medications for seizures, such as carbamazepine, lamotrigine, phenobarbital, phenytoin, primidone, valproic acid, zonisamide Certain medications for stomach problems, such as dicyclomine, hyoscyamine Certain medications for travel sickness, such as scopolamine Certain medications that treat or prevent blood clots, such as warfarin, enoxaparin, dalteparin, apixaban, dabigatran, rivaroxaban Digoxin Diltiazem Estrogen and progestin hormones General anesthetics, such as halothane, isoflurane, methoxyflurane, propofol Glyburide Hydrochlorothiazide Ipratropium Lithium Medications that relax muscles Metformin NSAIDs, medications for pain and inflammation, such as ibuprofen or naproxen Opioid medications for pain Phenothiazines, such as chlorpromazine, mesoridazine, prochlorperazine, thioridazine Pioglitazone This list may not describe all possible interactions. Give your health care provider a  list of all the medicines, herbs, non-prescription drugs, or dietary supplements you use. Also tell them if you smoke, drink alcohol, or use illegal drugs. Some items may interact with your medicine. What should I watch for while using this medication? Visit your care team for regular checks on your progress. Tell your care team if your symptoms do not start to get better or if they get worse. Do not suddenly stop taking this medication. You may develop a severe reaction. Your care team will tell you how much medication to take. If your care team wants you to stop the medication, the dose may be slowly lowered over time to avoid any side effects. Wear a medical ID bracelet or chain. Carry a card that describes your condition. List the medications and doses you take on the card. This medication may affect your coordination, reaction time, or judgment. Do not drive or operate machinery until you know how this medication affects you. Sit up or stand slowly to reduce the risk of dizzy or fainting spells. Drinking alcohol with this medication can increase the risk of these side effects. This medication may cause serious skin reactions. They can happen weeks to months after starting the medication. Contact your care team right away  if you notice fevers or flu-like symptoms with a rash. The rash may be red or purple and then turn into blisters or peeling of the skin. You may also notice a red rash with swelling of the face, lips, or lymph nodes in your neck or under your arms. This medication may cause thoughts of suicide or depression. This includes sudden changes in mood, behaviors, or thoughts. These changes can happen at any time but are more common in the beginning of treatment or after a change in dose. Call your care team right away if you experience these thoughts or worsening depression. This medication may slow your child's growth if it is taken for a long time at high doses. Your child's care team will  monitor your child's growth. Using this medication for a long time may weaken your bones. The risk of bone fractures may be increased. Talk to your care team about your bone health. Discuss this medication with your care team if you may be pregnant. Serious birth defects can occur if you take this medication during pregnancy. There are benefits and risks to taking medications during pregnancy. Your care team can help you find the option that works for you. Contraception is recommended while taking this medication. Estrogen and progestin hormones may not work as well while you are taking this medication. Your care team can help you find the option that works for you. Talk to your care team before breastfeeding. Changes to your treatment plan may be needed. What side effects may I notice from receiving this medication? Side effects that you should report to your care team as soon as possible: Allergic reactions--skin rash, itching, hives, swelling of the face, lips, tongue, or throat High acid level--trouble breathing, unusual weakness or fatigue, confusion, headache, fast or irregular heartbeat, nausea, vomiting High ammonia level--unusual weakness or fatigue, confusion, loss of appetite, nausea, vomiting, seizures Fever that does not go away, decrease in sweat Kidney stones--blood in the urine, pain or trouble passing urine, pain in the lower back or sides Redness, blistering, peeling or loosening of the skin, including inside the mouth Sudden eye pain or change in vision such as blurry vision, seeing halos around lights, vision loss Thoughts of suicide or self-harm, worsening mood, feelings of depression Side effects that usually do not require medical attention (report to your care team if they continue or are bothersome): Burning or tingling sensation in hands or feet Difficulty with paying attention, memory, or speech Dizziness Drowsiness Fatigue Loss of appetite with weight loss Slow or  sluggish movements of the body This list may not describe all possible side effects. Call your doctor for medical advice about side effects. You may report side effects to FDA at 1-800-FDA-1088. Where should I keep my medication? Keep out of the reach of children and pets. Store between 15 and 30 degrees C (59 and 86 degrees F). Protect from moisture. Keep the container tightly closed. Get rid of any unused medication after the expiration date. To get rid of medications that are no longer needed or have expired: Take the medication to a medication take-back program. Check with your pharmacy or law enforcement to find a location. If you cannot return the medication, check the label or package insert to see if the medication should be thrown out in the garbage or flushed down the toilet. If you are not sure, ask your care team. If it is safe to put it in the trash, empty the medication out of the container. Mix  the medication with cat litter, dirt, coffee grounds, or other unwanted substance. Seal the mixture in a bag or container. Put it in the trash. NOTE: This sheet is a summary. It may not cover all possible information. If you have questions about this medicine, talk to your doctor, pharmacist, or health care provider.  2023 Elsevier/Gold Standard (2007-11-28 00:00:00)  Rimegepant Disintegrating Tablets What is this medication? RIMEGEPANT (ri ME je pant) prevents and treats migraines. It works by blocking a substance in the body that causes migraines. This medicine may be used for other purposes; ask your health care provider or pharmacist if you have questions. COMMON BRAND NAME(S): NURTEC ODT What should I tell my care team before I take this medication? They need to know if you have any of these conditions: Kidney disease Liver disease An unusual or allergic reaction to rimegepant, other medications, foods, dyes, or preservatives Pregnant or trying to get pregnant Breast-feeding How  should I use this medication? Take this medication by mouth. Take it as directed on the prescription label. Leave the tablet in the sealed pack until you are ready to take it. With dry hands, open the pack and gently remove the tablet. If the tablet breaks or crumbles, throw it away. Use a new tablet. Place the tablet in the mouth and allow it to dissolve. Then, swallow it. Do not cut, crush, or chew this medication. You do not need water to take this medication. Talk to your care team about the use of this medication in children. Special care may be needed. Overdosage: If you think you have taken too much of this medicine contact a poison control center or emergency room at once. NOTE: This medicine is only for you. Do not share this medicine with others. What if I miss a dose? This does not apply. This medication is not for regular use. What may interact with this medication? Certain medications for fungal infections, such as fluconazole, itraconazole Rifampin This list may not describe all possible interactions. Give your health care provider a list of all the medicines, herbs, non-prescription drugs, or dietary supplements you use. Also tell them if you smoke, drink alcohol, or use illegal drugs. Some items may interact with your medicine. What should I watch for while using this medication? Visit your care team for regular checks on your progress. Tell your care team if your symptoms do not start to get better or if they get worse. What side effects may I notice from receiving this medication? Side effects that you should report to your care team as soon as possible: Allergic reactions--skin rash, itching, hives, swelling of the face, lips, tongue, or throat Side effects that usually do not require medical attention (report to your care team if they continue or are bothersome): Nausea Stomach pain This list may not describe all possible side effects. Call your doctor for medical advice about  side effects. You may report side effects to FDA at 1-800-FDA-1088. Where should I keep my medication? Keep out of the reach of children and pets. Store at room temperature between 20 and 25 degrees C (68 and 77 degrees F). Get rid of any unused medication after the expiration date. To get rid of medications that are no longer needed or have expired: Take the medication to a medication take-back program. Check with your pharmacy or law enforcement to find a location. If you cannot return the medication, check the label or package insert to see if the medication should be thrown out  in the garbage or flushed down the toilet. If you are not sure, ask your care team. If it is safe to put it in the trash, take the medication out of the container. Mix the medication with cat litter, dirt, coffee grounds, or other unwanted substance. Seal the mixture in a bag or container. Put it in the trash. NOTE: This sheet is a summary. It may not cover all possible information. If you have questions about this medicine, talk to your doctor, pharmacist, or health care provider.  2023 Elsevier/Gold Standard (2021-11-28 00:00:00) Galcanezumab Injection What is this medication? GALCANEZUMAB (gal ka NEZ ue mab) prevents migraines. It works by blocking a substance in the body that causes migraines. It may also be used to treat cluster headaches. It is a monoclonal antibody. This medicine may be used for other purposes; ask your health care provider or pharmacist if you have questions. COMMON BRAND NAME(S): Emgality What should I tell my care team before I take this medication? They need to know if you have any of these conditions: An unusual or allergic reaction to galcanezumab, other medications, foods, dyes, or preservatives Pregnant or trying to get pregnant Breast-feeding How should I use this medication? This medication is injected under the skin. You will be taught how to prepare and give it. Take it as directed  on the prescription label. Keep taking it unless your care team tells you to stop. It is important that you put your used needles and syringes in a special sharps container. Do not put them in a trash can. If you do not have a sharps container, call your pharmacist or care team to get one. Talk to your care team about the use of this medication in children. Special care may be needed. Overdosage: If you think you have taken too much of this medicine contact a poison control center or emergency room at once. NOTE: This medicine is only for you. Do not share this medicine with others. What if I miss a dose? If you miss a dose, take it as soon as you can. If it is almost time for your next dose, take only that dose. Do not take double or extra doses. What may interact with this medication? Interactions are not expected. This list may not describe all possible interactions. Give your health care provider a list of all the medicines, herbs, non-prescription drugs, or dietary supplements you use. Also tell them if you smoke, drink alcohol, or use illegal drugs. Some items may interact with your medicine. What should I watch for while using this medication? Visit your care team for regular checks on your progress. Tell your care team if your symptoms do not start to get better or if they get worse. What side effects may I notice from receiving this medication? Side effects that you should report to your care team as soon as possible: Allergic reactions or angioedema--skin rash, itching or hives, swelling of the face, eyes, lips, tongue, arms, or legs, trouble swallowing or breathing Side effects that usually do not require medical attention (report to your care team if they continue or are bothersome): Pain, redness, or irritation at injection site This list may not describe all possible side effects. Call your doctor for medical advice about side effects. You may report side effects to FDA at  1-800-FDA-1088. Where should I keep my medication? Keep out of the reach of children and pets. Store in a refrigerator or at room temperature between 20 and 25 degrees C (  68 and 77 degrees F). Refrigeration (preferred): Store in the refrigerator. Do not freeze. Keep in the original container until you are ready to take it. Remove the dose from the carton about 30 minutes before it is time for you to use it. If the dose is not used, it may be stored in original container at room temperature for 7 days. Get rid of any unused medication after the expiration date. Room Temperature: This medication may be stored at room temperature for up to 7 days. Keep it in the original container. Protect from light until time of use. If it is stored at room temperature, get rid of any unused medication after 7 days or after it expires, whichever is first. To get rid of medications that are no longer needed or have expired: Take the medication to a medication take-back program. Check with your pharmacy or law enforcement to find a location. If you cannot return the medication, ask your pharmacist or care team how to get rid of this medication safely. NOTE: This sheet is a summary. It may not cover all possible information. If you have questions about this medicine, talk to your doctor, pharmacist, or health care provider.  2023 Elsevier/Gold Standard (2021-11-27 00:00:00)

## 2022-06-27 LAB — C-REACTIVE PROTEIN: CRP: <1 mg/L (ref 0–10)

## 2022-06-28 ENCOUNTER — Ambulatory Visit: Payer: 59 | Admitting: Family Medicine

## 2022-06-30 DIAGNOSIS — G935 Compression of brain: Secondary | ICD-10-CM | POA: Insufficient documentation

## 2022-06-30 DIAGNOSIS — G509 Disorder of trigeminal nerve, unspecified: Secondary | ICD-10-CM | POA: Insufficient documentation

## 2022-06-30 DIAGNOSIS — G501 Atypical facial pain: Secondary | ICD-10-CM | POA: Insufficient documentation

## 2022-06-30 DIAGNOSIS — G589 Mononeuropathy, unspecified: Secondary | ICD-10-CM | POA: Insufficient documentation

## 2022-07-01 ENCOUNTER — Other Ambulatory Visit (HOSPITAL_BASED_OUTPATIENT_CLINIC_OR_DEPARTMENT_OTHER): Payer: Self-pay

## 2022-07-01 ENCOUNTER — Telehealth: Payer: Self-pay | Admitting: Neurology

## 2022-07-01 MED ORDER — HYDROXYZINE HCL 10 MG PO TABS
ORAL_TABLET | ORAL | 1 refills | Status: DC
  Filled 2022-07-01: qty 30, 5d supply, fill #0
  Filled 2022-07-25: qty 30, 5d supply, fill #1

## 2022-07-01 NOTE — Telephone Encounter (Signed)
UMR NPR sent to GI per patient request

## 2022-07-04 ENCOUNTER — Other Ambulatory Visit: Payer: Self-pay | Admitting: Physician Assistant

## 2022-07-04 ENCOUNTER — Telehealth: Payer: Self-pay | Admitting: Orthopaedic Surgery

## 2022-07-04 DIAGNOSIS — M25512 Pain in left shoulder: Secondary | ICD-10-CM

## 2022-07-04 NOTE — Telephone Encounter (Signed)
Patient called. She would like a PT referral sent to [facility]. [phone] fax is [phone]

## 2022-07-08 DIAGNOSIS — L84 Corns and callosities: Secondary | ICD-10-CM | POA: Diagnosis not present

## 2022-07-08 DIAGNOSIS — M79671 Pain in right foot: Secondary | ICD-10-CM | POA: Diagnosis not present

## 2022-07-08 DIAGNOSIS — M25512 Pain in left shoulder: Secondary | ICD-10-CM | POA: Diagnosis not present

## 2022-07-08 DIAGNOSIS — M25612 Stiffness of left shoulder, not elsewhere classified: Secondary | ICD-10-CM | POA: Diagnosis not present

## 2022-07-08 DIAGNOSIS — M542 Cervicalgia: Secondary | ICD-10-CM | POA: Diagnosis not present

## 2022-07-09 ENCOUNTER — Ambulatory Visit
Admission: RE | Admit: 2022-07-09 | Discharge: 2022-07-09 | Disposition: A | Discharge status: Home / Self Care | Payer: 59 | Source: Ambulatory Visit | Attending: Neurology | Admitting: Neurology

## 2022-07-09 DIAGNOSIS — G500 Trigeminal neuralgia: Secondary | ICD-10-CM

## 2022-07-09 DIAGNOSIS — G589 Mononeuropathy, unspecified: Secondary | ICD-10-CM | POA: Diagnosis not present

## 2022-07-09 DIAGNOSIS — G501 Atypical facial pain: Secondary | ICD-10-CM

## 2022-07-09 DIAGNOSIS — G509 Disorder of trigeminal nerve, unspecified: Secondary | ICD-10-CM

## 2022-07-09 MED ORDER — GADOBENATE DIMEGLUMINE 529 MG/ML IV SOLN
12.0000 mL | Freq: Once | INTRAVENOUS | Status: AC | PRN
  Administered 2022-07-09: 12 mL via INTRAVENOUS

## 2022-07-12 DIAGNOSIS — M25612 Stiffness of left shoulder, not elsewhere classified: Secondary | ICD-10-CM | POA: Diagnosis not present

## 2022-07-12 DIAGNOSIS — M542 Cervicalgia: Secondary | ICD-10-CM | POA: Diagnosis not present

## 2022-07-12 DIAGNOSIS — M25512 Pain in left shoulder: Secondary | ICD-10-CM | POA: Diagnosis not present

## 2022-07-13 ENCOUNTER — Other Ambulatory Visit: Payer: 59

## 2022-07-15 DIAGNOSIS — G43909 Migraine, unspecified, not intractable, without status migrainosus: Secondary | ICD-10-CM | POA: Diagnosis not present

## 2022-07-15 DIAGNOSIS — M47816 Spondylosis without myelopathy or radiculopathy, lumbar region: Secondary | ICD-10-CM | POA: Diagnosis not present

## 2022-07-15 DIAGNOSIS — M542 Cervicalgia: Secondary | ICD-10-CM | POA: Diagnosis not present

## 2022-07-15 DIAGNOSIS — M25612 Stiffness of left shoulder, not elsewhere classified: Secondary | ICD-10-CM | POA: Diagnosis not present

## 2022-07-15 DIAGNOSIS — M545 Low back pain, unspecified: Secondary | ICD-10-CM | POA: Diagnosis not present

## 2022-07-15 DIAGNOSIS — M25512 Pain in left shoulder: Secondary | ICD-10-CM | POA: Diagnosis not present

## 2022-07-15 DIAGNOSIS — E785 Hyperlipidemia, unspecified: Secondary | ICD-10-CM | POA: Diagnosis not present

## 2022-07-16 ENCOUNTER — Telehealth: Payer: Self-pay | Admitting: Neurology

## 2022-07-16 DIAGNOSIS — G500 Trigeminal neuralgia: Secondary | ICD-10-CM

## 2022-07-16 DIAGNOSIS — G501 Atypical facial pain: Secondary | ICD-10-CM

## 2022-07-16 NOTE — Telephone Encounter (Signed)
MRI shows a blood vessel compressing the sensory nerve that is likely causing her facial pain. This can be fixed and we can send you for consultation to [facility] or [facility] for surgical options that may fix the issue. Please discuss with her and place referral to dr laxton or [facility] or let me know and I can order thanks

## 2022-07-16 NOTE — Addendum Note (Signed)
Addended by: Brandon Melnick on: 07/16/2022 01:50 PM   Modules accepted: Orders

## 2022-07-16 NOTE — Telephone Encounter (Signed)
I called pt and relayed the results below of MRI per Dr. Jaynee Eagles.  Pt verbalized understanding. Ok to see Dr. Arlan Organ at [facility]. [place].  I gave her the # [phone].

## 2022-07-17 ENCOUNTER — Telehealth: Payer: Self-pay | Admitting: Neurology

## 2022-07-17 NOTE — Addendum Note (Signed)
Addended by: Brandon Melnick on: 07/17/2022 11:56 AM   Modules accepted: Orders

## 2022-07-17 NOTE — Telephone Encounter (Signed)
Referral for neurosurgery sent to [facility] for Dr. Lilyan Punt as requested by neurologist. Phone: [phone], Fax: [phone]

## 2022-07-18 DIAGNOSIS — M25512 Pain in left shoulder: Secondary | ICD-10-CM | POA: Diagnosis not present

## 2022-07-18 DIAGNOSIS — M542 Cervicalgia: Secondary | ICD-10-CM | POA: Diagnosis not present

## 2022-07-18 DIAGNOSIS — M25612 Stiffness of left shoulder, not elsewhere classified: Secondary | ICD-10-CM | POA: Diagnosis not present

## 2022-07-22 ENCOUNTER — Encounter: Payer: Self-pay | Admitting: Neurology

## 2022-07-22 DIAGNOSIS — M542 Cervicalgia: Secondary | ICD-10-CM | POA: Diagnosis not present

## 2022-07-22 DIAGNOSIS — M25512 Pain in left shoulder: Secondary | ICD-10-CM | POA: Diagnosis not present

## 2022-07-22 DIAGNOSIS — M25612 Stiffness of left shoulder, not elsewhere classified: Secondary | ICD-10-CM | POA: Diagnosis not present

## 2022-07-24 ENCOUNTER — Ambulatory Visit: Payer: 59 | Admitting: Orthopaedic Surgery

## 2022-07-24 DIAGNOSIS — M542 Cervicalgia: Secondary | ICD-10-CM | POA: Diagnosis not present

## 2022-07-24 DIAGNOSIS — M25512 Pain in left shoulder: Secondary | ICD-10-CM | POA: Diagnosis not present

## 2022-07-24 DIAGNOSIS — M25612 Stiffness of left shoulder, not elsewhere classified: Secondary | ICD-10-CM | POA: Diagnosis not present

## 2022-07-26 ENCOUNTER — Other Ambulatory Visit (HOSPITAL_BASED_OUTPATIENT_CLINIC_OR_DEPARTMENT_OTHER): Payer: Self-pay

## 2022-07-26 ENCOUNTER — Other Ambulatory Visit: Payer: Self-pay | Admitting: Family Medicine

## 2022-07-26 NOTE — Telephone Encounter (Signed)
Sent patient a message to see what dose she has been using.

## 2022-07-26 NOTE — Telephone Encounter (Signed)
Sent patient MyChart message regarding gabapentin dosing.

## 2022-07-29 ENCOUNTER — Other Ambulatory Visit (HOSPITAL_BASED_OUTPATIENT_CLINIC_OR_DEPARTMENT_OTHER): Payer: Self-pay

## 2022-07-29 ENCOUNTER — Telehealth: Payer: Self-pay | Admitting: Family Medicine

## 2022-07-29 MED ORDER — GABAPENTIN 100 MG PO CAPS
200.0000 mg | ORAL_CAPSULE | Freq: Two times a day (BID) | ORAL | 0 refills | Status: DC
  Filled 2022-07-29: qty 180, 45d supply, fill #0

## 2022-07-29 NOTE — Telephone Encounter (Signed)
$'400mg'Q$  at night and '100mg'$  in the morning.

## 2022-07-30 ENCOUNTER — Ambulatory Visit: Payer: 59 | Admitting: Family Medicine

## 2022-07-30 NOTE — Telephone Encounter (Signed)
$'400mg'F$  at night and '100mg'$  in the morning.

## 2022-08-08 DIAGNOSIS — M542 Cervicalgia: Secondary | ICD-10-CM | POA: Diagnosis not present

## 2022-08-08 DIAGNOSIS — M25512 Pain in left shoulder: Secondary | ICD-10-CM | POA: Diagnosis not present

## 2022-08-08 DIAGNOSIS — M25612 Stiffness of left shoulder, not elsewhere classified: Secondary | ICD-10-CM | POA: Diagnosis not present

## 2022-08-09 DIAGNOSIS — M25612 Stiffness of left shoulder, not elsewhere classified: Secondary | ICD-10-CM | POA: Diagnosis not present

## 2022-08-09 DIAGNOSIS — M25512 Pain in left shoulder: Secondary | ICD-10-CM | POA: Diagnosis not present

## 2022-08-09 DIAGNOSIS — M542 Cervicalgia: Secondary | ICD-10-CM | POA: Diagnosis not present
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
## 2022-08-16 DIAGNOSIS — M25612 Stiffness of left shoulder, not elsewhere classified: Secondary | ICD-10-CM | POA: Diagnosis not present

## 2022-08-16 DIAGNOSIS — M25512 Pain in left shoulder: Secondary | ICD-10-CM | POA: Diagnosis not present

## 2022-08-16 DIAGNOSIS — M542 Cervicalgia: Secondary | ICD-10-CM | POA: Diagnosis not present

## 2022-08-19 ENCOUNTER — Ambulatory Visit (INDEPENDENT_AMBULATORY_CARE_PROVIDER_SITE_OTHER): Payer: 59 | Admitting: Family Medicine

## 2022-08-19 ENCOUNTER — Ambulatory Visit: Payer: Self-pay

## 2022-08-19 VITALS — Ht 62.0 in

## 2022-08-19 DIAGNOSIS — M75112 Incomplete rotator cuff tear or rupture of left shoulder, not specified as traumatic: Secondary | ICD-10-CM

## 2022-08-19 DIAGNOSIS — M25512 Pain in left shoulder: Secondary | ICD-10-CM

## 2022-08-19 NOTE — Patient Instructions (Addendum)
Good to see you  PRP given today  No ice or ibuprofen for 3 day Heat and tylenol are okay  Keep follow up visit that is already scheduled

## 2022-08-19 NOTE — Progress Notes (Unsigned)
  [place] Phone: [phone] Subjective:    I'm seeing this patient by the request  of:  [name], [name], DO  CC: Left shoulder pain  [id number]  Michele [name] is a 56 y.o. female coming in with complaint of L shoulder pain. Patient here for PRP.       Past Medical History:  Diagnosis Date   Abdominal bloating    Anemia    Anxiety    Arthritis    Chicken pox    Chronic pain 11/20/2021   DDD (degenerative disc disease), cervical    Depression    Dorsalgia    Glaucoma    Hypercholesterolemia    Intestinal gas excretion    Migraine 08/17/2020   Nonallopathic lesion of cervical region 01/23/2021   l areas are chronic   Patient tolerated the procedure well with improvement in symptoms  Patient given exercises, stretches and lifestyle modifications  See medications in patient instructions if given  Patient will follow up in 4-8 weeks   Peptic ulcer    Scapular dyskinesis 01/23/2021   Scarlet fever    Sequelae of other specified infectious and parasitic diseases 11/20/2021   Skin cancer    Tick bite 02/22/2018   UTI (urinary tract infection)    Yeast infection    Past Surgical History:  Procedure Laterality Date   BASAL CELL CARCINOMA EXCISION     BREAST BIOPSY Left 1992   Duct removal (No scar seen)    BREAST EXCISIONAL BIOPSY Left 1992   COLONOSCOPY  07/2021   DACRORHINOCYSTOTOMY  10/2020   LEFT HEART CATH AND CORONARY ANGIOGRAPHY N/A 01/16/2018   Procedure: LEFT HEART CATH AND CORONARY ANGIOGRAPHY;  Surgeon: Nelva Bush, MD;  Location: [facility];  Service: Cardiovascular;  Laterality: N/A;   UPPER GASTROINTESTINAL ENDOSCOPY  07/2021     Objective  Height '5\' 2"'$  (1.575 m), last menstrual period 11/29/2021.   General: No apparent distress alert and oriented x3 mood and affect normal, dressed appropriately.  HEENT: Pupils equal, extraocular movements intact  Respiratory: Patient's  speak in full sentences and does not appear short of breath  Cardiovascular: No lower extremity edema, non tender, no erythema  Left shoulder exam does show that patient does have tenderness noted  Procedure: Real-time Ultrasound Guided Injection of left supraspinatus tendon sheath Device: GE Logiq E  Ultrasound guided injection is preferred based studies that show increased duration, increased effect, greater accuracy, decreased procedural pain, increased response rate with ultrasound guided versus blind injection.  Verbal informed consent obtained.  Time-out conducted.  Noted no overlying erythema, induration, or other signs of local infection.  Skin prepped in a sterile fashion.  Local anesthesia: Topical Ethyl chloride.  With sterile technique and under real time ultrasound guidance:  Joint visualized.  21g 2 inch needle inserted posterior approach. Pictures taken for needle placement. Patient did have injection of 0.5 cc of 0.5% Marcaine, and then injected 4 cc of PRP into the tendon sheath Completed without difficulty  Advised to call if fevers/chills, erythema, induration, drainage, or persistent bleeding.  Images permanently stored and available for review in the ultrasound unit.  Impression: Technically successful ultrasound guided injection.   Impression and Recommendations:    The above documentation has been reviewed and is accurate and complete Lyndal Pulley, DO
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
## 2022-08-20 ENCOUNTER — Encounter: Payer: Self-pay | Admitting: Family Medicine

## 2022-08-20 DIAGNOSIS — M75102 Unspecified rotator cuff tear or rupture of left shoulder, not specified as traumatic: Secondary | ICD-10-CM | POA: Insufficient documentation
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
## 2022-08-20 NOTE — Assessment & Plan Note (Addendum)
We discussed patient's ultrasound findings previously on MRI.  We discussed different treatment options.  We discussed the possibility of surgical intervention.  Patient as well as her husband decided they would like to try PRP and we will schedule that at their convenience.  Continue the gabapentin for now.  Depending on how patient responds to the PRP we will consider the possibility of epidural for the neck as well.  Follow-up with me again 6 weeks after PRP total time reviewing the MRI and today before the appointment, discussing with patient.  Total time today 31 minutes

## 2022-08-26 ENCOUNTER — Ambulatory Visit: Payer: 59 | Admitting: Family Medicine

## 2022-08-26 DIAGNOSIS — Z6825 Body mass index (BMI) 25.0-25.9, adult: Secondary | ICD-10-CM | POA: Diagnosis not present

## 2022-08-26 DIAGNOSIS — Z01419 Encounter for gynecological examination (general) (routine) without abnormal findings: Secondary | ICD-10-CM | POA: Diagnosis not present

## 2022-08-26 DIAGNOSIS — Z124 Encounter for screening for malignant neoplasm of cervix: Secondary | ICD-10-CM | POA: Diagnosis not present

## 2022-08-26 DIAGNOSIS — Z13820 Encounter for screening for osteoporosis: Secondary | ICD-10-CM | POA: Diagnosis not present

## 2022-08-28 ENCOUNTER — Other Ambulatory Visit (HOSPITAL_BASED_OUTPATIENT_CLINIC_OR_DEPARTMENT_OTHER): Payer: Self-pay

## 2022-08-28 ENCOUNTER — Other Ambulatory Visit: Payer: Self-pay | Admitting: Family Medicine

## 2022-08-29 ENCOUNTER — Other Ambulatory Visit (HOSPITAL_BASED_OUTPATIENT_CLINIC_OR_DEPARTMENT_OTHER): Payer: Self-pay

## 2022-08-29 MED ORDER — HYDROXYZINE HCL 10 MG PO TABS
10.0000 mg | ORAL_TABLET | Freq: Three times a day (TID) | ORAL | 1 refills | Status: DC | PRN
  Filled 2022-08-29: qty 30, 5d supply, fill #0
  Filled 2022-10-15: qty 30, 5d supply, fill #1

## 2022-08-30 ENCOUNTER — Other Ambulatory Visit (HOSPITAL_BASED_OUTPATIENT_CLINIC_OR_DEPARTMENT_OTHER): Payer: Self-pay

## 2022-08-30 MED ORDER — FLUOXETINE HCL 10 MG PO CAPS
ORAL_CAPSULE | ORAL | 0 refills | Status: DC
  Filled 2022-08-30: qty 45, 30d supply, fill #0

## 2022-09-02 ENCOUNTER — Telehealth: Payer: Self-pay

## 2022-09-02 ENCOUNTER — Other Ambulatory Visit: Payer: Self-pay | Admitting: Family Medicine

## 2022-09-02 ENCOUNTER — Other Ambulatory Visit (HOSPITAL_BASED_OUTPATIENT_CLINIC_OR_DEPARTMENT_OTHER): Payer: Self-pay

## 2022-09-02 DIAGNOSIS — M25612 Stiffness of left shoulder, not elsewhere classified: Secondary | ICD-10-CM | POA: Diagnosis not present

## 2022-09-02 DIAGNOSIS — M25512 Pain in left shoulder: Secondary | ICD-10-CM | POA: Diagnosis not present

## 2022-09-02 DIAGNOSIS — M542 Cervicalgia: Secondary | ICD-10-CM | POA: Diagnosis not present

## 2022-09-02 MED ORDER — GABAPENTIN 100 MG PO CAPS
200.0000 mg | ORAL_CAPSULE | Freq: Two times a day (BID) | ORAL | 0 refills | Status: DC
  Filled 2022-09-02 – 2022-09-03 (×2): qty 180, 45d supply, fill #0

## 2022-09-02 NOTE — Telephone Encounter (Signed)
Patient states that the shoulder pain is actually worse than it was before the PRP injection. Hurts when doing any type of motion and the degree of pain is worse. Feels more irritated and sharp. Patient states she followed the PRP protocol to a T, just wants to know if this is normal or if she needs to do something else.

## 2022-09-03 ENCOUNTER — Other Ambulatory Visit (HOSPITAL_BASED_OUTPATIENT_CLINIC_OR_DEPARTMENT_OTHER): Payer: Self-pay

## 2022-09-03 NOTE — Telephone Encounter (Signed)
Sent patient MyChart message.

## 2022-09-04 DIAGNOSIS — M25512 Pain in left shoulder: Secondary | ICD-10-CM | POA: Diagnosis not present

## 2022-09-04 DIAGNOSIS — M542 Cervicalgia: Secondary | ICD-10-CM | POA: Diagnosis not present

## 2022-09-04 DIAGNOSIS — M25612 Stiffness of left shoulder, not elsewhere classified: Secondary | ICD-10-CM | POA: Diagnosis not present

## 2022-09-06 DIAGNOSIS — G500 Trigeminal neuralgia: Secondary | ICD-10-CM | POA: Diagnosis not present

## 2022-09-06 DIAGNOSIS — M5481 Occipital neuralgia: Secondary | ICD-10-CM | POA: Diagnosis not present

## 2022-09-06 DIAGNOSIS — G501 Atypical facial pain: Secondary | ICD-10-CM | POA: Diagnosis not present

## 2022-09-17 DIAGNOSIS — M25512 Pain in left shoulder: Secondary | ICD-10-CM | POA: Diagnosis not present

## 2022-09-17 DIAGNOSIS — M25612 Stiffness of left shoulder, not elsewhere classified: Secondary | ICD-10-CM | POA: Diagnosis not present

## 2022-09-17 DIAGNOSIS — M542 Cervicalgia: Secondary | ICD-10-CM | POA: Diagnosis not present

## 2022-09-18 IMAGING — MG MM DIGITAL SCREENING BILAT W/ TOMO AND CAD
8 series · 9 of 24 positions shown · non-contrast
Comparison: Previous exam(s).

CLINICAL DATA: Screening.

EXAM:
DIGITAL SCREENING BILATERAL MAMMOGRAM WITH TOMOSYNTHESIS AND CAD
TECHNIQUE: Bilateral screening digital craniocaudal and mediolateral oblique
mammograms were obtained. Bilateral screening digital breast
tomosynthesis was performed. The images were evaluated with
computer-aided detection.

[L MLO synth-2D]
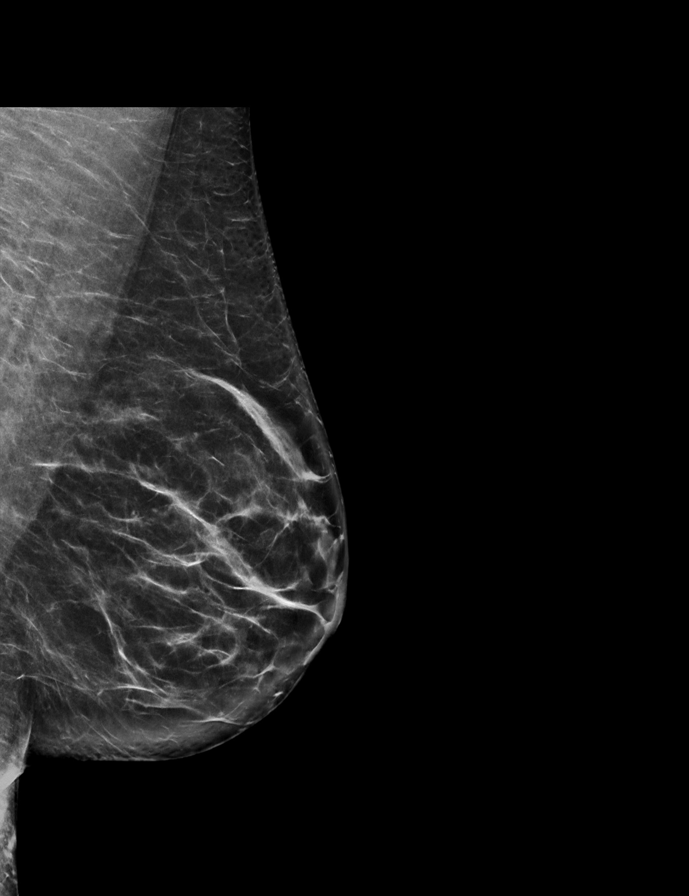

[R CC synth-2D]
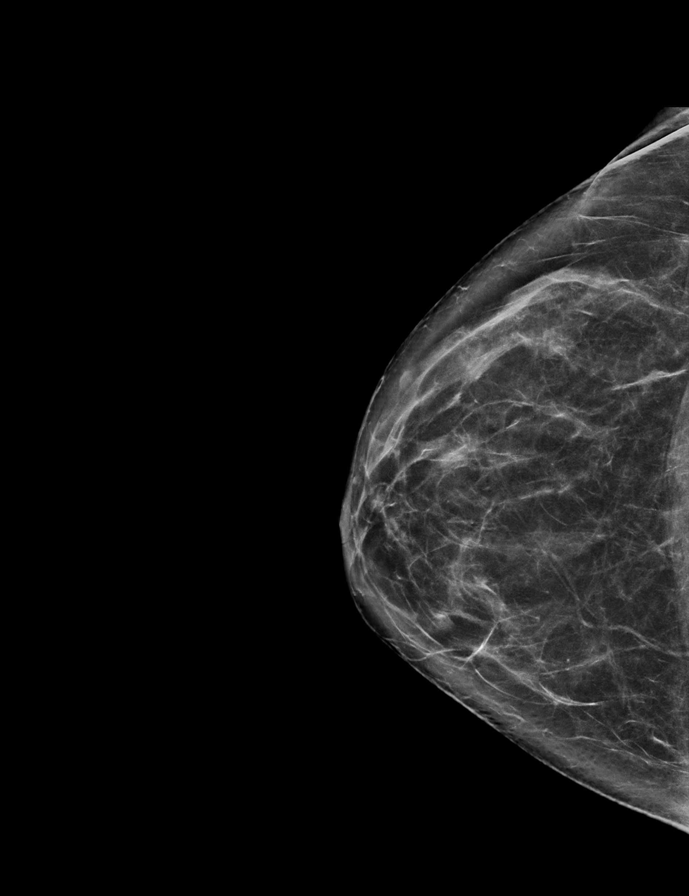

[R MLO synth-2D]
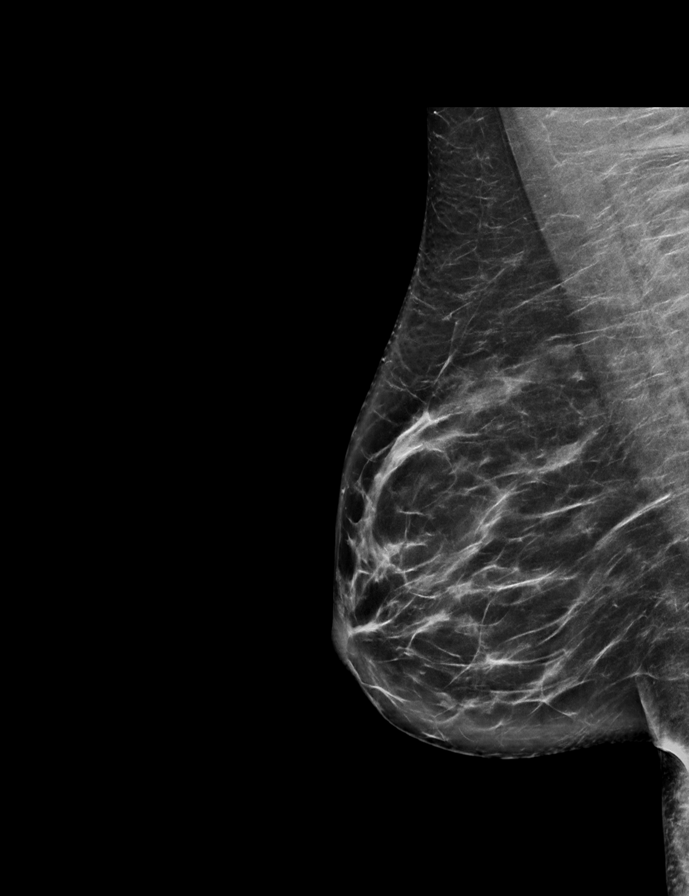

[L CC synth-2D]
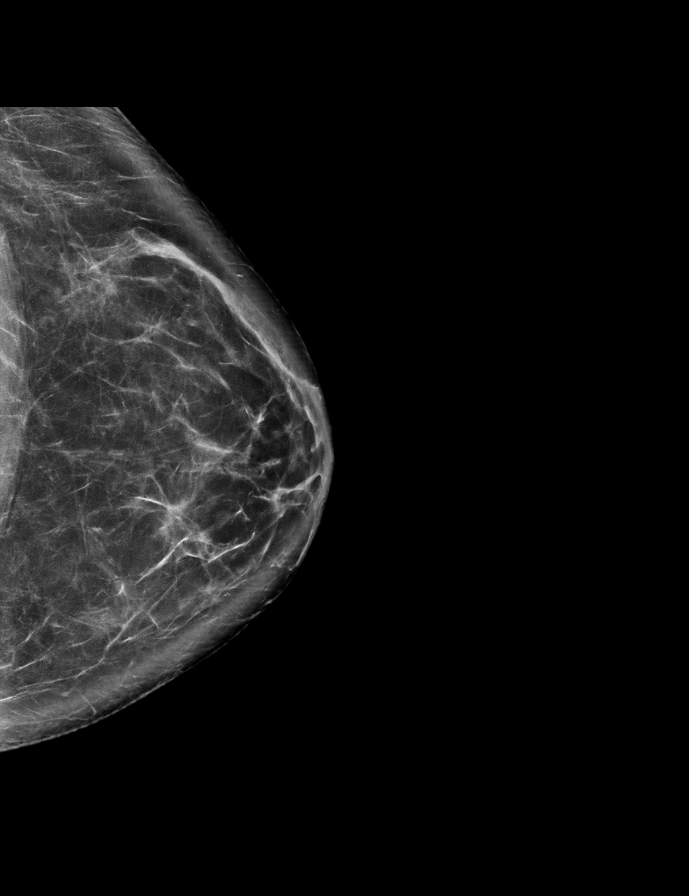

[R MLO tomo · 2 of 72 frames shown]
[frame 24/72]
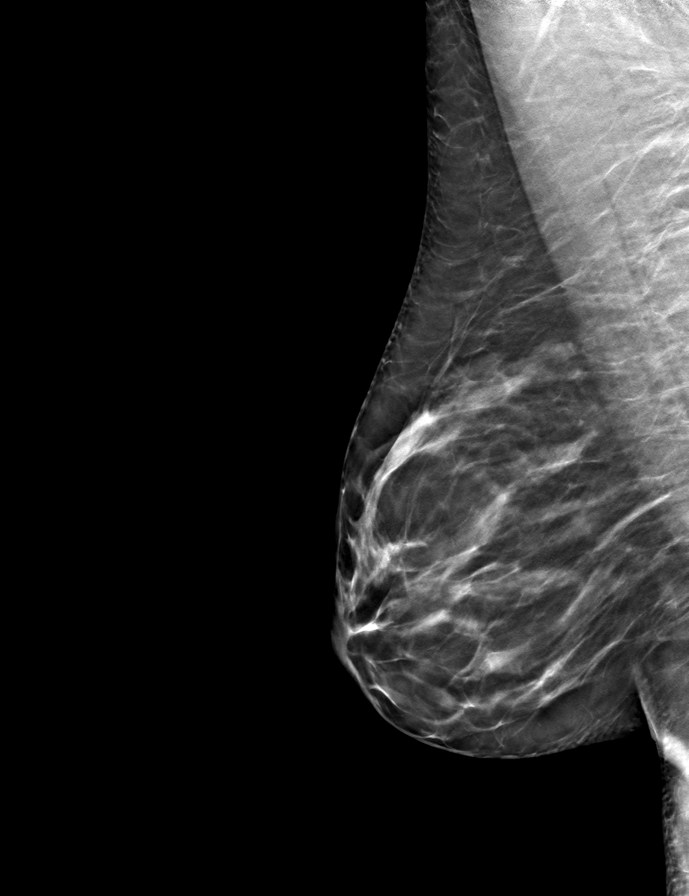
[frame 37/72]
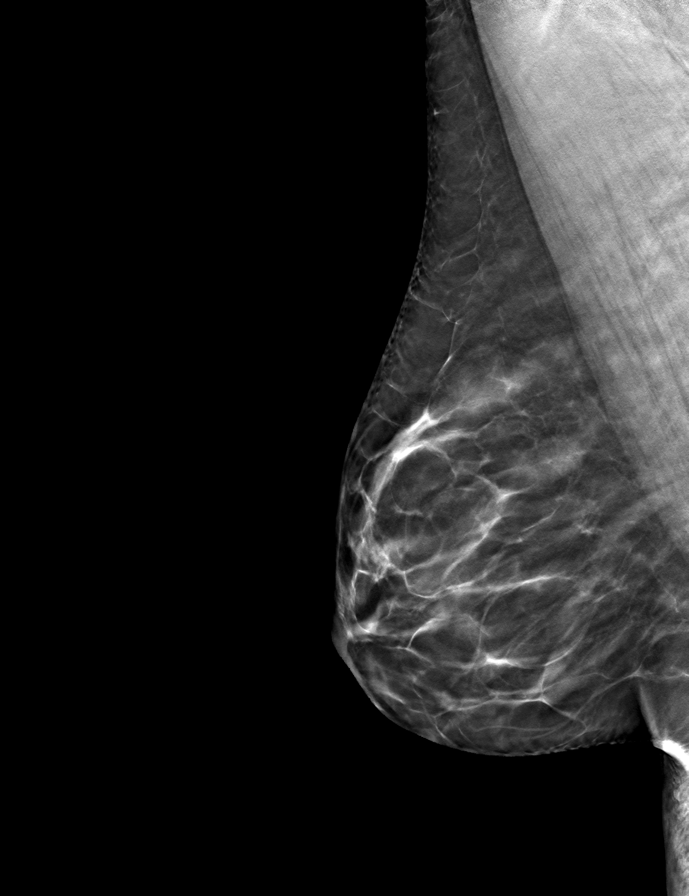

[L MLO tomo · tomo slice 37/72.0]
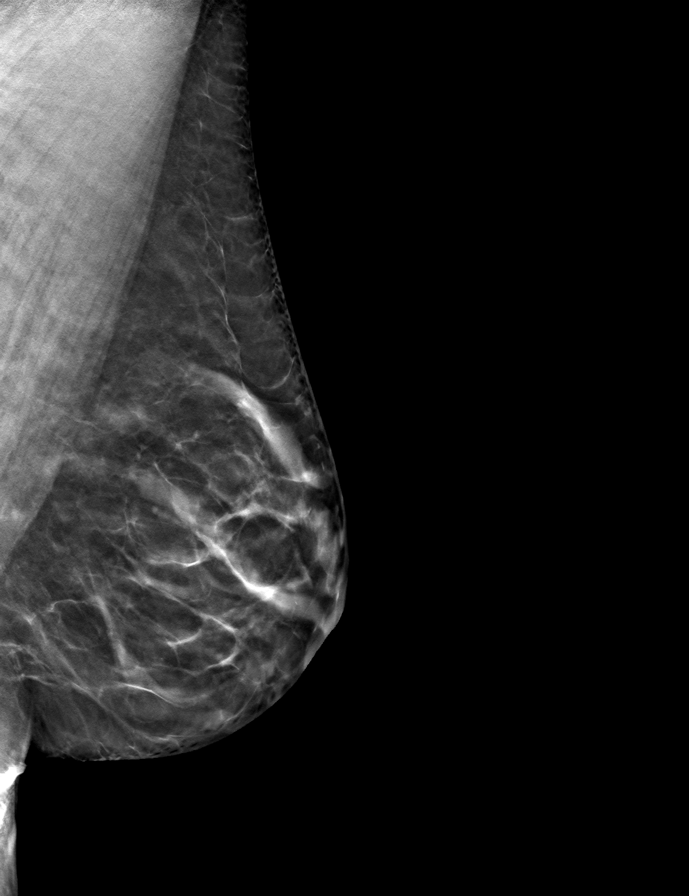

[L CC tomo · tomo slice 41/81.0]
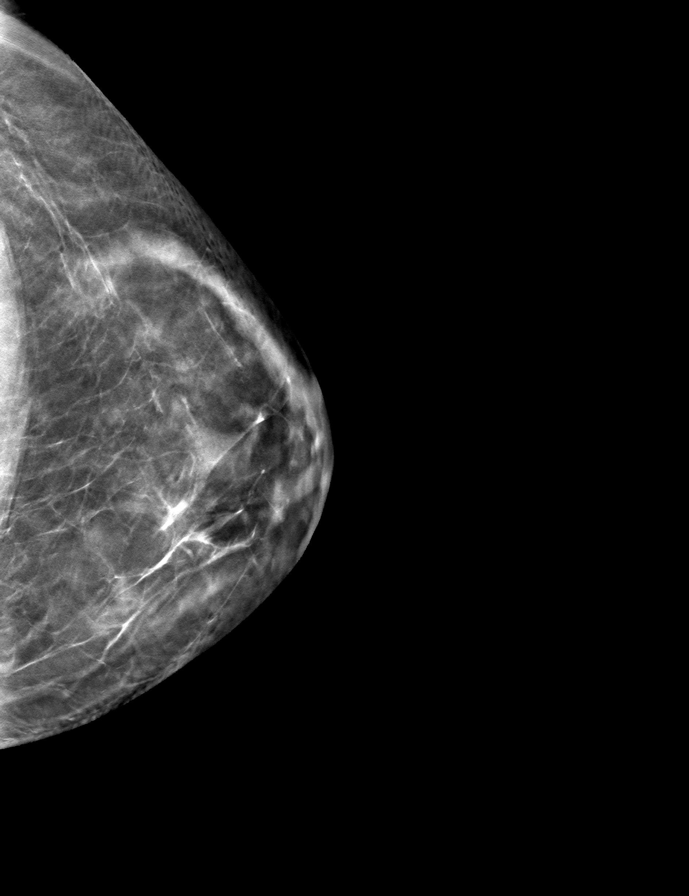

[R CC tomo · tomo slice 37/72.0]
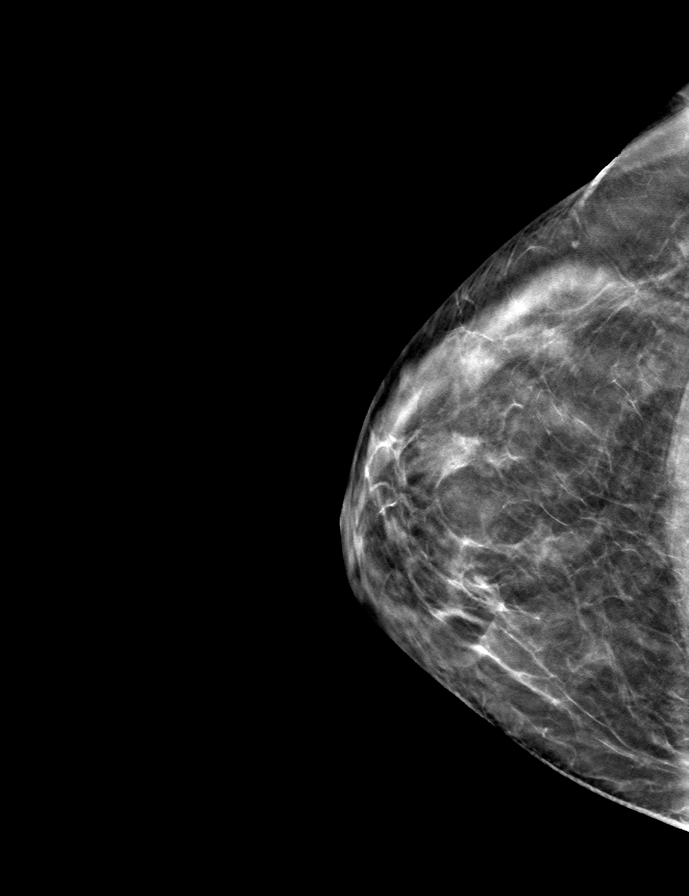

[9 of 24 positions shown; findings below may reference images not displayed]

ACR Breast Density Category b: There are scattered areas of
fibroglandular density.
FINDINGS: There are no findings suspicious for malignancy.
IMPRESSION: No mammographic evidence of malignancy. A result letter of this
screening mammogram will be mailed directly to the patient.

RECOMMENDATION:
Screening mammogram in one year. (Code:51-O-LD2)

BI-RADS CATEGORY  1: Negative.

## 2022-09-19 DIAGNOSIS — M542 Cervicalgia: Secondary | ICD-10-CM | POA: Diagnosis not present

## 2022-09-19 DIAGNOSIS — M25512 Pain in left shoulder: Secondary | ICD-10-CM | POA: Diagnosis not present

## 2022-09-19 DIAGNOSIS — M25612 Stiffness of left shoulder, not elsewhere classified: Secondary | ICD-10-CM | POA: Diagnosis not present
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
## 2022-09-23 ENCOUNTER — Other Ambulatory Visit (HOSPITAL_BASED_OUTPATIENT_CLINIC_OR_DEPARTMENT_OTHER): Payer: Self-pay

## 2022-09-23 ENCOUNTER — Ambulatory Visit: Payer: Self-pay

## 2022-09-23 ENCOUNTER — Encounter: Payer: Self-pay | Admitting: Family Medicine

## 2022-09-23 ENCOUNTER — Other Ambulatory Visit: Payer: Self-pay

## 2022-09-23 ENCOUNTER — Ambulatory Visit (INDEPENDENT_AMBULATORY_CARE_PROVIDER_SITE_OTHER): Payer: 59 | Admitting: Family Medicine

## 2022-09-23 ENCOUNTER — Telehealth: Payer: Self-pay | Admitting: Family Medicine

## 2022-09-23 VITALS — BP 110/72 | HR 78 | Ht 62.0 in

## 2022-09-23 DIAGNOSIS — M545 Low back pain, unspecified: Secondary | ICD-10-CM

## 2022-09-23 DIAGNOSIS — M25512 Pain in left shoulder: Secondary | ICD-10-CM

## 2022-09-23 DIAGNOSIS — M9904 Segmental and somatic dysfunction of sacral region: Secondary | ICD-10-CM | POA: Diagnosis not present

## 2022-09-23 DIAGNOSIS — M9901 Segmental and somatic dysfunction of cervical region: Secondary | ICD-10-CM | POA: Diagnosis not present

## 2022-09-23 DIAGNOSIS — M79632 Pain in left forearm: Secondary | ICD-10-CM | POA: Diagnosis not present

## 2022-09-23 DIAGNOSIS — M9908 Segmental and somatic dysfunction of rib cage: Secondary | ICD-10-CM | POA: Diagnosis not present

## 2022-09-23 DIAGNOSIS — M9902 Segmental and somatic dysfunction of thoracic region: Secondary | ICD-10-CM

## 2022-09-23 DIAGNOSIS — M75112 Incomplete rotator cuff tear or rupture of left shoulder, not specified as traumatic: Secondary | ICD-10-CM

## 2022-09-23 DIAGNOSIS — M542 Cervicalgia: Secondary | ICD-10-CM

## 2022-09-23 DIAGNOSIS — M9903 Segmental and somatic dysfunction of lumbar region: Secondary | ICD-10-CM | POA: Diagnosis not present

## 2022-09-23 DIAGNOSIS — M5010 Cervical disc disorder with radiculopathy, unspecified cervical region: Secondary | ICD-10-CM

## 2022-09-23 MED ORDER — GABAPENTIN 300 MG PO CAPS
300.0000 mg | ORAL_CAPSULE | Freq: Two times a day (BID) | ORAL | 2 refills | Status: DC
  Filled 2022-09-23: qty 60, 30d supply, fill #0

## 2022-09-23 MED ORDER — KETOROLAC TROMETHAMINE 60 MG/2ML IM SOLN
60.0000 mg | Freq: Once | INTRAMUSCULAR | Status: AC
  Administered 2022-09-23: 60 mg via INTRAMUSCULAR

## 2022-09-23 MED ORDER — CYCLOBENZAPRINE HCL 5 MG PO TABS
5.0000 mg | ORAL_TABLET | Freq: Three times a day (TID) | ORAL | 0 refills | Status: DC | PRN
  Filled 2022-09-23: qty 90, 30d supply, fill #0

## 2022-09-23 MED ORDER — METHYLPREDNISOLONE ACETATE 80 MG/ML IJ SUSP
80.0000 mg | Freq: Once | INTRAMUSCULAR | Status: AC
  Administered 2022-09-23: 80 mg via INTRAMUSCULAR

## 2022-09-23 NOTE — Telephone Encounter (Signed)
Sent in Flexeril per a verbal from Dr. Tamala Julian.

## 2022-09-23 NOTE — Telephone Encounter (Signed)
Patient called stating that Flexeril was supposed to be sent in today but is thinking Dr Tamala Julian might have forgot? Could you check on this please?

## 2022-09-23 NOTE — Patient Instructions (Addendum)
Good to see you  Referral placed for Dr. Tamera Punt for the shoulder Injection in backside today  Bump up gabapentin to 600 mg at night Follow up in 6-8 weeks

## 2022-09-23 NOTE — Assessment & Plan Note (Signed)
Known arthritic changes but did respond extremely well to osteopathic manipulation today.  We did not discuss significant changes in medication but given a Toradol and Depo-Medrol injections today.  Continue the gabapentin of the 300 mg at night going to 600 mg total.  Follow-up with me again in 6 to 8 weeks if the manipulation is helpful.

## 2022-09-23 NOTE — Assessment & Plan Note (Signed)
Patient does have a partial tear of the supraspinatus but is in the critical zone.  Has not responded well to the PRP, failed conservative therapy including 12 weeks of formal physical therapy and did have 2 injections by another provider previously of steroids.  At this point would like to refer patient to orthopedic surgery to discuss the possibility of surgical intervention.  Patient is in agreement with the plan as well as the spouse.  Discussed with patient that we do not have other good treatment options at this time.  Total time discussed with patient as well as significant other 34 minutes

## 2022-09-23 NOTE — Assessment & Plan Note (Signed)

## 2022-09-24 ENCOUNTER — Ambulatory Visit: Payer: 59 | Admitting: Family Medicine

## 2022-09-30 ENCOUNTER — Other Ambulatory Visit (HOSPITAL_BASED_OUTPATIENT_CLINIC_OR_DEPARTMENT_OTHER): Payer: Self-pay

## 2022-09-30 ENCOUNTER — Telehealth: Payer: Self-pay | Admitting: Neurology

## 2022-09-30 ENCOUNTER — Telehealth (INDEPENDENT_AMBULATORY_CARE_PROVIDER_SITE_OTHER): Payer: 59 | Admitting: Neurology

## 2022-09-30 DIAGNOSIS — G500 Trigeminal neuralgia: Secondary | ICD-10-CM

## 2022-09-30 DIAGNOSIS — M75112 Incomplete rotator cuff tear or rupture of left shoulder, not specified as traumatic: Secondary | ICD-10-CM | POA: Diagnosis not present

## 2022-09-30 MED ORDER — GABAPENTIN 300 MG PO CAPS
600.0000 mg | ORAL_CAPSULE | Freq: Every day | ORAL | 1 refills | Status: DC
  Filled 2022-09-30 – 2022-10-27 (×4): qty 180, 90d supply, fill #0

## 2022-09-30 MED ORDER — GABAPENTIN 100 MG PO CAPS
100.0000 mg | ORAL_CAPSULE | Freq: Two times a day (BID) | ORAL | 1 refills | Status: DC
  Filled 2022-09-30 – 2022-10-09 (×3): qty 180, 90d supply, fill #0

## 2022-09-30 NOTE — Progress Notes (Addendum)
[facility]    Provider:  Dr Jaynee Eagles Referring Provider: Ma Hillock, DO Primary Care Physician:  Ma Hillock, DO  CC: Headaches, temple pain  Virtual Visit via Video Note  I connected with Michele [name] on 09/30/22 at  1:30 PM EST by a video enabled telemedicine application and verified that I am speaking with the correct person using two identifiers.  Location: Patient: home Provider: office   I discussed the limitations of evaluation and management by telemedicine and the availability of in person appointments. The patient expressed understanding and agreed to proceed.   Follow Up Instructions:    I discussed the assessment and treatment plan with the patient. The patient was provided an opportunity to ask questions and all were answered. The patient agreed with the plan and demonstrated an understanding of the instructions.   The patient was advised to call back or seek an in-person evaluation if the symptoms worsen or if the condition fails to improve as anticipated.  I provided over 40 minutes of non-face-to-face time during this encounter.   Melvenia Beam, MD   09/30/2022: MRI trigeminal showed: MRI shows a blood vessel compressing the sensory nerve that is likely causing her facial pain. She saw Dr. Arlan Organ at [facility] who noted "Her brain MRI does show a prominent vessel abutting her left trigeminal nerve in the prepontine cistern". They discussed gamma knife and decompression surgery. Patient confirms this. Patient reports that her pain She still has daily headaches. She has a sharp throbbing pain left temporal area. Daily. Intermittent. She has some eye dripping on that side but does not appear to be related to the TGN.  MRI 07/09/2022:    IMPRESSION: reviewed images and agree 1. Similar appearance of a branch vessel crossing over the top of the exiting left fifth cranial nerve. 2. No other focal cranial nerve lesions. 3. Normal  appearance of the brainstem and visualized intracranial contents.  Patient complains of symptoms per HPI as well as the following symptoms: left temple shooting pain . Pertinent negatives and positives per HPI. All others negative     06/26/2022: She is here today for headaches and radiating pain fom temple to eyebrow per Dr. Raoul Pitch. We saw patient in 2019 for multiple complaints and pain, we performed an extensive blood work investigation and MRI brain and cervical spine. Labs were all normal/negative including lyme's disease and investigations below. Since then she has had multiple other bloodwork including repeats of some of those below. 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 We could never find any causes. Daily she has tample pain, shooting in the left temple, stabbing, every now and then it comes above the eyebrow, more stabbing that it is migrainous. No vision changes, every single day and when she lays down. 4 times a weak she gets a little imbalance\d, does yoga every day.  Her left eye drips. She lives with it can be 4-7/8/ Shooting with lacrimation.   Medications tried: verapamil contraindicated.   Reviewed notes, labs and imaging from outside physicians, which showed:  Orders Placed last encounter in 2019 were all unremarkable/neg or normal except for mild cerebellar ectopia.   Procedures   MR FACE/TRIGEMINAL WO/W CM   MR BRAIN W WO CONTRAST   TSH   Sedimentation rate   B12 and Folate Panel   Tissue transglutaminase, IgA  Gliadin antibodies, serum   Heavy metals, blood   Vitamin B6   Methylmalonic acid, serum   Vitamin B1   ANA, IFA (with reflex)   C-reactive protein   Basic Metabolic Panel   B. burgdorfi Antibody   05/2022: esr normal, BUN 10 creatining 0.81  MRI 06-11-2022 showed possible left C5 radiculopathy:  Disc levels:   C2-C3:  No  stenosis.   C3-C4:  No stenosis.  Trace central protrusion.  No stenosis.   C4-C5: Left paracentral protrusion partially effaces the left ventral subarachnoid space. No significant canal or foraminal stenosis.   C5-C6:  Minimal disc bulge.  No canal or foraminal stenosis.   C6-C7:  No stenosis.   C7-T1:  No stenosis.   IMPRESSION: No high-grade stenosis. Left paracentral disc protrusion at C4-C5; correlate for C5 radicular symptoms.  2019: MRI face/trigem nerve:  FINDINGS: On the sagittal images, the cerebellar tonsils extend just below the foramen magnum but not enough to be considered a Chiari malformation.  The spinal cord is imaged quarterly to C4 and is normal.  The pituitary gland and the optic chiasm appear normal.  The cerebellum, brainstem and visible portion of the brain appears normal.  The trigeminal nerves are followed from the pons into [place] and appear normal.  A blood vessel is adjacent to the left trigeminal nerve dorsal root entry zone but it does not distort the nerve.    There is minimal mucoperiosteal thickening of some of the ethmoid air cells and the other paranasal sinuses appear normal.  The 7th/8th nerve complexes appear normal.  The orbits appear normal.    Incidental note is made of asymmetry of the transverse and sigmoid sinuses and jugular veins, larger on the right than the left.  This is a normal variant.  Flow voids are noted in the major intracerebral arteries.    IMPRESSION: This MRI of the face and trigeminal nerve region appears normal.  There is a blood vessel adjacent to the dorsal root entry zone of the left trigeminal nerve but it does not cause any distortion.   FINDINGS:  The brain parenchyma shows no abnormal signal intensities.  No structural lesion, tumor or infarcts are noted.  The paranasal sinuses show mild chronic inflammatory changes.  The pituitary gland  appears normal.  The cerebellar tonsils are 4 to 5 mm below the foramen  magnum for borderline type I Arnold-Chiari malformation with some crowding of the craniovertebral junction.  Extra-axial brain structures appear normal.  Orbits appear unremarkable paranasal sinuses show mild chronic inflammatory changes.  The subarachnoid spaces and ventricular system appear normal.  Diffusion-weighted imaging is negative for acute ischemia.  Gradient echo images do not show significant microhemorrhages.  Thin sections through the brainstem showed normal symmetric appearance of both trigeminal nerves without any abnormal vascular loop or masses noted.  Postcontrast images do not result in abnormal areas of enhancement.      2019: MRI brain : FINDINGS:  The brain parenchyma shows no abnormal signal intensities.  No structural lesion, tumor or infarcts are noted.  The paranasal sinuses show mild chronic inflammatory changes.  The pituitary gland  appears normal.  The cerebellar tonsils are 4 to 5 mm below the foramen magnum for borderline type I Arnold-Chiari malformation with some crowding of the craniovertebral junction.  Extra-axial brain structures appear normal.  Orbits appear unremarkable paranasal sinuses show mild chronic inflammatory changes.  The subarachnoid spaces and ventricular system appear normal.  Diffusion-weighted imaging is negative for acute  ischemia.  Gradient echo images do not show significant microhemorrhages.  Thin sections through the brainstem showed normal symmetric appearance of both trigeminal nerves without any abnormal vascular loop or masses noted.  Postcontrast images do not result in abnormal areas of enhancement.         IMPRESSION: Unremarkable MRI scan of the brain with and without contrast.  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  HPI 02/20/2018:  Michele [name] is a 56 y.o. female here as a referral from Dr. Raoul Pitch for trigeminal neuralgia but she says she is here to discuss  multiple  Problems she thinks is related to lyme disease.  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 She had a tick bite in June. In August she started getting infrequent sharp shooting pain behind her left ear (points behind the tragus) lasts 2-3 seconds, started to become more frequent once a week. Now it is every other day twice a day. She is taking a B complex. Periodically her bottom lip wil quiver, she can feel it but no one can see it. She also has a pain poking in her temple, not as sharp as behind the ear, but sharp, also 3 seconds and then another 3 seconds the whole episode can last 30 minutes - 3 hours, no autonomic symptoms with the stabbing, the 2 pains (behind the ear and in the temple) don;t seem to be associated. Left eye was "twitching" for 45 minutes. She also has "random strange things". A month ago her forearm started hurting severely for an hour and went away. She was lying in bed later that week and her legs ached for 20 minutes then gone. Yesterday her groin hurt for 15 minutes. Odd transient incidents that happen once. Since her root canal she has intermittent belly pain and slight nausea and awful smeeling gas. She has chest discomfort. Not sensitive to sunlight or have rashes with sunlight. 10-12 headache free days. Emgality is helping. She had a lot of side effects to the triptans, would help then rebound.   Reviewed notes, labs and imaging from outside physicians, which showed:  12/26/2016: CT head showed No acute intracranial abnormalities including mass lesion or mass effect, hydrocephalus, extra-axial fluid collection, midline shift, hemorrhage, or acute infarction, large ischemic events (personally reviewed images)  Bmp unremarkable, CMP with anemia (10.8/33.9) and platelets 407 otherwise normal.   Reviewed referring physician's notes, examination normal, she has anxiety on Prozac,  she has back pain, she is been seen by GI for abdominal bloating and lower abdominal pelvic discomfort also seen by cardiology for chest pain and shortness of breath, she had a colonoscopy, she has high cholesterol LDL 161 but she deferred treatment  Review of Systems: Patient complains of symptoms per HPI as well as the following symptoms: sleepiness, cramps, decreased energy. Pertinent negatives and positives per HPI. All others negative.   Social History   Socioeconomic History   Marital status: Married    Spouse name: Not on file   Number of children: 2   Years of education: Not on file   Highest education level: Not on file  Occupational History   Occupation: Therapist, sports    Employer: [employer]  Tobacco Use   Smoking status: Never    Passive exposure: Never   Smokeless tobacco: Never  Vaping Use   Vaping Use: Never used  Substance and  Sexual Activity   Alcohol use: Yes    Alcohol/week: 5.0 standard drinks of alcohol    Types: 5 Glasses of wine per week    Comment: 1 per day   Drug use: No   Sexual activity: Yes    Partners: Male  Other Topics Concern   Not on file  Social History Narrative   Marital status/children/pets: 2 children.    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:      -Wears a bicycle helmet riding a bike: Yes     -smoke alarm in the home:Yes     - wears seatbelt: Yes     - Feels safe in their relationships: Yes      Social Determinants of Health   Financial Resource Strain: Not on file  Food Insecurity: Not on file  Transportation Needs: Not on file  Physical Activity: Not on file  Stress: Not on file  Social Connections: Not on file  Intimate Partner Violence: Not on file    Family History  Problem Relation Age of Onset   Hypertension Mother    Diabetes Mother    Diverticulitis Mother    Heart failure Mother    Hyperlipidemia Father    Diabetes Father    Heart attack Father    Arrhythmia Father    Heart attack Sister    Obesity Sister     Arthritis Sister    Asthma Sister    Alcohol abuse Brother    Stroke Maternal Uncle    Pancreatic cancer Maternal Uncle    Leukemia Maternal Grandmother    Diabetes Maternal Grandfather    Diabetes Paternal Grandmother    Heart attack Paternal Grandfather    Heart disease Paternal Grandfather    Migraines Daughter    Liver disease Other        Paternal great aunt   Breast cancer Neg Hx    Colon cancer Neg Hx    Esophageal cancer Neg Hx    Inflammatory bowel disease Neg Hx    Rectal cancer Neg Hx    Stomach cancer Neg Hx     Past Medical History:  Diagnosis Date   Abdominal bloating    Anemia    Anxiety    Arthritis    Chicken pox    Chronic pain 11/20/2021   DDD (degenerative disc disease), cervical    Depression    Dorsalgia    Glaucoma    Hypercholesterolemia    Intestinal gas excretion    Migraine 08/17/2020   Nonallopathic lesion of cervical region 01/23/2021   l areas are chronic   Patient tolerated the procedure well with improvement in symptoms  Patient given exercises, stretches and lifestyle modifications  See medications in patient instructions if given  Patient will follow up in 4-8 weeks   Peptic ulcer    Scapular dyskinesis 01/23/2021   Scarlet fever    Sequelae of other specified infectious and parasitic diseases 11/20/2021   Skin cancer    Tick bite 02/22/2018   UTI (urinary tract infection)    Yeast infection     Past Surgical History:  Procedure Laterality Date   BASAL CELL CARCINOMA EXCISION     BREAST BIOPSY Left 1992   Duct removal (No scar seen)    BREAST EXCISIONAL BIOPSY Left 1992   COLONOSCOPY  07/2021   DACRORHINOCYSTOTOMY  10/2020   LEFT HEART CATH AND CORONARY ANGIOGRAPHY N/A 01/16/2018   Procedure: LEFT HEART CATH AND CORONARY ANGIOGRAPHY;  Surgeon: Nelva Bush, MD;  Location:  [facility];  Service: Cardiovascular;  Laterality: N/A;   UPPER GASTROINTESTINAL ENDOSCOPY  07/2021    Current Outpatient Medications  Medication  Sig Dispense Refill   aspirin EC 81 MG tablet Take 1 tablet by mouth every other day.     Cholecalciferol (VITAMIN D3) 25 MCG/SPRAY LIQD Take 250 mcg by mouth daily.     cyclobenzaprine (FLEXERIL) 5 MG tablet Take 1 tablet (5 mg total) by mouth 3 (three) times daily as needed for muscle spasms. 90 tablet 0   FLUoxetine (PROZAC) 10 MG capsule TAKE 1 CAPSULE BY MOUTH ONCE DAILY ALTERNATING WITH 2 CAPSULES EVERY OTHER DAY. 45 capsule 0   gabapentin (NEURONTIN) 100 MG capsule Take 120m in the morning and 1019min the afternoon. 180 capsule 1   gabapentin (NEURONTIN) 300 MG capsule Take 2 capsules (600 mg total) by mouth at bedtime. 180 capsule 1   hydrOXYzine (ATARAX) 10 MG tablet Take 1-2 tablets (10-20 mg total) by mouth 3 (three) times daily as needed for itching. 30 tablet 1   progesterone (PROMETRIUM) 100 MG capsule Take 1 tablet by mouth every night at bedtime. Increase to 2 capsules at bedtime prior to cycle. 120 capsule 2   No current facility-administered medications for this visit.    Allergies as of 09/30/2022 - Review Complete 09/23/2022  Allergen Reaction Noted   Lipitor [atorvastatin calcium] Other (See Comments) 01/14/2018   Celebrex [celecoxib] Rash 12/05/2015    Vitals: LMP 11/29/2021  Last Weight:  Wt Readings from Last 1 Encounters:  06/26/22 142 lb (64.4 kg)   Last Height:   Ht Readings from Last 1 Encounters:  09/23/22 _0  (1.575 m)   Physical exam: Exam: Gen: NAD, conversant      CV: aDenies palpitations or chest pain or SOB. VS: Breathing at a normal rate.  Not febrile. Eyes: Conjunctivae clear without exudates or hemorrhage  Neuro: Detailed Neurologic Exam  Speech:    Speech is normal; fluent and spontaneous with normal comprehension.  Cognition:    The patient is oriented to person, place, and time;     recent and remote memory intact;     language fluent;     normal attention, concentration,     fund of knowledge Cranial Nerves:    The pupils  are equal, round, and reactive to light.  Visual fields are full . Extraocular movements are intact.  The face is symmetric with normal sensation.  Hearing intact. Voice is normal.  Coordination:    Normal finger to nose  Gait:    Normal native gait  Motor Observation:   no involuntary movements noted. Tone:    Appears normal  Posture:    Posture is normal. normal erect    Strength:    Strength is anti-gravity and symmetric in the upper and lower limbs.        Assessment/Plan:  She is here today for headaches and radiating pain fom temple to eyebrow per Dr. KuRaoul PitchWe saw patient in 2019 for multiple complaints and pain, we performed an extensive blood work investigation and MRI brain and cervical spine. Labs were all normal/negative including lyme's disease and investigations below. Since then she has had multiple other bloodwork including repeats of some of those below. 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 She saw a [facility] doctor and he treated with 2 weeks of doxy  I do think the pain in the temple area is most closely resembles trigeminal neuralgia and she Her brain MRI does show a prominent vessel abutting her left trigeminal nerve in the prepontine cistern. She has seen Dr. Arlan Organ at this time will try medical management. Can try Tegretol or a higher dose of Topiramate (only tried 77m).  Could also increase her gabapentin. She is just taking gabapentin at bedtime 500-600 at bedtime.  - MRI brain in 2019 showed mildly descended tonsiles, rounded, not crowding the cervical junction, likely not clinically synptomatic.  - Repeat MRI Trigeminal protocol - showed Her brain MRI does show a prominent vessel abutting her left trigeminal nerve in the prepontine cistern. She has decided not to have treatment [facility] Samples of emgality did  not help, other medications tried include verapamil (blood pressure too low), baclofen(can cause sedation), Topamax(prescribe low dose only 230mnever increased), on gabapentin not helping at current dose,  tried amitriptyline (severe sedation)  Meds ordered this encounter  Medications   gabapentin (NEURONTIN) 100 MG capsule    Sig: Take 10044mn the morning and 100m26m the afternoon.    Dispense:  180 capsule    Refill:  1   gabapentin (NEURONTIN) 300 MG capsule    Sig: Take 2 capsules (600 mg total) by mouth at bedtime.    Dispense:  180 capsule    Refill:  1     PRIOR ASSESSMENTS BELOW 2019:   Orders Placed last encounter in 2019 were all unremarkable/neg or normal except for mild cerebellar ectopia.   Procedures   MR FACE/TRIGEMINAL WO/W CM   MR BRAIN W WO CONTRAST   TSH   Sedimentation rate   B12 and Folate Panel   Tissue transglutaminase, IgA   Gliadin antibodies, serum   Heavy metals, blood   Vitamin B6   Methylmalonic acid, serum   Vitamin B1   ANA, IFA (with reflex)   C-reactive protein   Basic Metabolic Panel   B. burgdorfi Antibody    MRI 06-11-2022 showed possible left C5 radiculopathy:  Disc levels:   C2-C3:  No stenosis.   C3-C4:  No stenosis.  Trace central protrusion.  No stenosis.   C4-C5: Left paracentral protrusion partially effaces the left ventral subarachnoid space. No significant canal or foraminal stenosis.   C5-C6:  Minimal disc bulge.  No canal or foraminal stenosis.   C6-C7:  No stenosis.   C7-T1:  No stenosis.   IMPRESSION: No high-grade stenosis. Left paracentral disc protrusion at C4-C5; correlate for C5 radicular symptoms.  2019: MRI face/trigem nerve:  FINDINGS: On the sagittal images, the cerebellar tonsils extend just below the foramen magnum but not enough to be considered a Chiari malformation.  The spinal cord is imaged quarterly to C4 and is normal.  The pituitary gland and the optic chiasm appear normal.  The  cerebellum, brainstem and visible portion of the brain appears normal.  The trigeminal nerves are followed from the pons into [place] appear normal.  A blood vessel is adjacent to the left trigeminal nerve dorsal root entry zone but it does not distort the nerve.    There is minimal mucoperiosteal thickening of some of the ethmoid air cells and the other paranasal sinuses appear normal.  The 7th/8th nerve complexes appear normal.  The orbits appear normal.    Incidental note is made of asymmetry of the transverse and sigmoid sinuses and jugular veins, larger on the right  than the left.  This is a normal variant.  Flow voids are noted in the major intracerebral arteries.    IMPRESSION: This MRI of the face and trigeminal nerve region appears normal.  There is a blood vessel adjacent to the dorsal root entry zone of the left trigeminal nerve but it does not cause any distortion. FINDINGS:  The brain parenchyma shows no abnormal signal intensities.  No structural lesion, tumor or infarcts are noted.  The paranasal sinuses show mild chronic inflammatory changes.  The pituitary gland  appears normal.  The cerebellar tonsils are 4 to 5 mm below the foramen magnum for borderline type I Arnold-Chiari malformation with some crowding of the craniovertebral junction.  Extra-axial brain structures appear normal.  Orbits appear unremarkable paranasal sinuses show mild chronic inflammatory changes.  The subarachnoid spaces and ventricular system appear normal.  Diffusion-weighted imaging is negative for acute ischemia.  Gradient echo images do not show significant microhemorrhages.  Thin sections through the brainstem showed normal symmetric appearance of both trigeminal nerves without any abnormal vascular loop or masses noted.  Postcontrast images do not result in abnormal areas of enhancement.      2019: MRI brain : FINDINGS:  The brain parenchyma shows no abnormal signal intensities.  No structural lesion,  tumor or infarcts are noted.  The paranasal sinuses show mild chronic inflammatory changes.  The pituitary gland  appears normal.  The cerebellar tonsils are 4 to 5 mm below the foramen magnum for borderline type I Arnold-Chiari malformation with some crowding of the craniovertebral junction.  Extra-axial brain structures appear normal.  Orbits appear unremarkable paranasal sinuses show mild chronic inflammatory changes.  The subarachnoid spaces and ventricular system appear normal.  Diffusion-weighted imaging is negative for acute ischemia.  Gradient echo images do not show significant microhemorrhages.  Thin sections through the brainstem showed normal symmetric appearance of both trigeminal nerves without any abnormal vascular loop or masses noted.  Postcontrast images do not result in abnormal areas of enhancement.         IMPRESSION: Unremarkable MRI scan of the brain with and without contrast.  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  No orders of the defined types were placed in this encounter.  Cc: Dr. Kenton Kingfisher  I spent over 40 minutes of face-to-face and non-face-to-face time with patient on the  1. Trigeminal neuralgia of left side of face     diagnosis.  This included previsit chart review, lab review, study review, order entry, electronic health record documentation, patient education on the different diagnostic and therapeutic options, counseling and coordination of care, risks and benefits of management, compliance, or risk factor reduction    Sarina Ill, MD  [facility] [address] [place], [place] [address]  Phone [phone] Fax [phone]

## 2022-09-30 NOTE — Addendum Note (Signed)
Addended by: Sarina Ill B on: 09/30/2022 02:05 PM   Modules accepted: Orders, Level of Service

## 2022-09-30 NOTE — Telephone Encounter (Signed)
Set up with 4 month follow up with NP preferrab;ly megan or amy thanks for trigeminal neuralgia

## 2022-09-30 NOTE — Patient Instructions (Addendum)
Decided to try taking Gabapentin during the day, it is a 3x a day medication, you can add on '100mg'$  in the morning and afternoon and then if you like increase to '300mg'$  in the morning and afternoon in addition to the nighttime dose. Let's start with '100mg'$  twice daily and '600mg'$  at bedtime. And then we can increase if needed to '300mg'$  twice daily and keep '600mg'$  at bedtime('600mg'$  at bedtime originally prescribed by zach smith for other issue)  Gabapentin Capsules or Tablets What is this medication? GABAPENTIN (GA ba pen tin) treats nerve pain. It may also be used to prevent and control seizures in people with epilepsy. It works by calming overactive nerves in your body. This medicine may be used for other purposes; ask your health care provider or pharmacist if you have questions. COMMON BRAND NAME(S): Active-PAC with Gabapentin, Orpha Bur, Gralise, Neurontin What should I tell my care team before I take this medication? They need to know if you have any of these conditions: Alcohol or substance use disorder Kidney disease Lung or breathing disease Suicidal thoughts, plans, or attempt; a previous suicide attempt by you or a family member An unusual or allergic reaction to gabapentin, other medications, foods, dyes, or preservatives Pregnant or trying to get pregnant Breast-feeding How should I use this medication? Take this medication by mouth with a glass of water. Follow the directions on the prescription label. You can take it with or without food. If it upsets your stomach, take it with food. Take your medication at regular intervals. Do not take it more often than directed. Do not stop taking except on your care team's advice. If you are directed to break the 600 or 800 mg tablets in half as part of your dose, the extra half tablet should be used for the next dose. If you have not used the extra half tablet within 28 days, it should be thrown away. A special MedGuide will be given to you by the  pharmacist with each prescription and refill. Be sure to read this information carefully each time. Talk to your care team about the use of this medication in children. While this medication may be prescribed for children as young as 3 years for selected conditions, precautions do apply. Overdosage: If you think you have taken too much of this medicine contact a poison control center or emergency room at once. NOTE: This medicine is only for you. Do not share this medicine with others. What if I miss a dose? If you miss a dose, take it as soon as you can. If it is almost time for your next dose, take only that dose. Do not take double or extra doses. What may interact with this medication? Alcohol Antihistamines for allergy, cough, and cold Certain medications for anxiety or sleep Certain medications for depression like amitriptyline, fluoxetine, sertraline Certain medications for seizures like phenobarbital, primidone Certain medications for stomach problems General anesthetics like halothane, isoflurane, methoxyflurane, propofol Local anesthetics like lidocaine, pramoxine, tetracaine Medications that relax muscles for surgery Opioid medications for pain Phenothiazines like chlorpromazine, mesoridazine, prochlorperazine, thioridazine This list may not describe all possible interactions. Give your health care provider a list of all the medicines, herbs, non-prescription drugs, or dietary supplements you use. Also tell them if you smoke, drink alcohol, or use illegal drugs. Some items may interact with your medicine. What should I watch for while using this medication? Visit your care team for regular checks on your progress. You may want to keep a record  at home of how you feel your condition is responding to treatment. You may want to share this information with your care team at each visit. You should contact your care team if your seizures get worse or if you have any new types of seizures. Do  not stop taking this medication or any of your seizure medications unless instructed by your care team. Stopping your medication suddenly can increase your seizures or their severity. This medication may cause serious skin reactions. They can happen weeks to months after starting the medication. Contact your care team right away if you notice fevers or flu-like symptoms with a rash. The rash may be red or purple and then turn into blisters or peeling of the skin. Or, you might notice a red rash with swelling of the face, lips or lymph nodes in your neck or under your arms. Wear a medical identification bracelet or chain if you are taking this medication for seizures. Carry a card that lists all your medications. This medication may affect your coordination, reaction time, or judgment. Do not drive or operate machinery until you know how this medication affects you. Sit up or stand slowly to reduce the risk of dizzy or fainting spells. Drinking alcohol with this medication can increase the risk of these side effects. Your mouth may get dry. Chewing sugarless gum or sucking hard candy, and drinking plenty of water may help. Watch for new or worsening thoughts of suicide or depression. This includes sudden changes in mood, behaviors, or thoughts. These changes can happen at any time but are more common in the beginning of treatment or after a change in dose. Call your care team right away if you experience these thoughts or worsening depression. If you become pregnant while using this medication, you may enroll in the [place] Pregnancy Registry by calling 7194538862. This registry collects information about the safety of antiepileptic medication use during pregnancy. What side effects may I notice from receiving this medication? Side effects that you should report to your care team as soon as possible: Allergic reactions or angioedema--skin rash, itching, hives, swelling of the  face, eyes, lips, tongue, arms, or legs, trouble swallowing or breathing Rash, fever, and swollen lymph nodes Thoughts of suicide or self harm, worsening mood, feelings of depression Trouble breathing Unusual changes in mood or behavior in children after use such as difficulty concentrating, hostility, or restlessness Side effects that usually do not require medical attention (report to your care team if they continue or are bothersome): Dizziness Drowsiness Nausea Swelling of ankles, feet, or hands Vomiting This list may not describe all possible side effects. Call your doctor for medical advice about side effects. You may report side effects to FDA at 1-800-FDA-1088. Where should I keep my medication? Keep out of reach of children and pets. Store at room temperature between 15 and 30 degrees C (59 and 86 degrees F). Get rid of any unused medication after the expiration date. This medication may cause accidental overdose and death if taken by other adults, children, or pets. To get rid of medications that are no longer needed or have expired: Take the medication to a medication take-back program. Check with your pharmacy or law enforcement to find a location. If you cannot return the medication, check the label or package insert to see if the medication should be thrown out in the garbage or flushed down the toilet. If you are not sure, ask your care team. If it is safe  to put it in the trash, empty the medication out of the container. Mix the medication with cat litter, dirt, coffee grounds, or other unwanted substance. Seal the mixture in a bag or container. Put it in the trash. NOTE: This sheet is a summary. It may not cover all possible information. If you have questions about this medicine, talk to your doctor, pharmacist, or health care provider.  2023 Elsevier/Gold Standard (2021-04-09 00:00:00) Trigeminal Neuralgia  Trigeminal neuralgia is a nerve disorder that causes severe pain on  one side of the face. The pain may last from a few seconds to several minutes, but it can happen hundreds of times a day. The pain is usually only on one side of the face. Symptoms may occur for days, weeks, or months and then go away for months or years. The pain may return and be worse than before. What are the causes? This condition may be caused by: Damage or pressure to a nerve in the head that is called the trigeminal nerve. An attack can be triggered by: Talking or chewing. Putting on makeup. Washing, shaving, or touching your face. Brushing your teeth. Blasts of hot or cold air. Primary demyelinating disorders, such as multiple sclerosis. Tumors. What increases the risk? You are more likely to develop this condition if: You are 96-77 years old. You are female. What are the signs or symptoms? The main symptom of this condition is severe pain in the jaw, lips, eyes, nose, scalp, forehead, and face. How is this diagnosed? This condition is diagnosed with a physical exam. A CT scan or an MRI may be done to rule out other conditions that can cause facial pain. How is this treated? This condition may be treated with: Measures to avoid the things that trigger your symptoms. Prescription medicines such as anticonvulsants. Procedures such as ablation, thermal, or radiation therapy. Cognitive or behavioral therapy. Complementary therapies such as: Gentle, regular exercise or yoga. Meditation. Aromatherapy. Acupuncture. Surgery. This may be done in severe cases if other medical treatment does not provide relief. It may take up to one month for treatment to start relieving the pain. Follow these instructions at home: Managing pain  Learn as much as you can about how to manage your pain. Ask your health care provider if a pain specialist would be helpful. Consider talking with a mental health care provider about how to cope with the pain. Consider joining a pain support  group. General instructions Take over-the-counter and prescription medicines only as told by your health care provider. Avoid the things that trigger your symptoms. It may help to: Chew on the unaffected side of your mouth. Avoid touching your face. Avoid blasts of hot or cold air. Keep all follow-up visits. Where to find more information Facial Pain Association: facepain.org Contact a health care provider if: Your medicine is not helping your symptoms. You have side effects from the medicine used for treatment. You develop new, unexplained symptoms, such as: Double vision. Facial weakness or numbness. Changes in hearing or balance. You feel depressed. Get help right away if: Your pain is severe and is not getting better. You develop suicidal thoughts. If you ever feel like you may hurt yourself or others, or have thoughts about taking your own life, get help right away. Go to your nearest emergency department or: Call your local emergency services (911 in the [place] Call a suicide crisis helpline, such as the [place] at (484)106-9674 or 988 in the [place]. This is open  24 hours a day in the [place] Text the Crisis Text Line at 267 124 9570 (in the [place].). Summary Trigeminal neuralgia is a nerve disorder that causes severe pain on one side of the face. The pain may last from a few seconds to several minutes. This condition is caused by damage or pressure to a nerve in the head that is called the trigeminal nerve. Treatment may include avoiding the things that trigger your symptoms, taking medicines, or having procedures or surgery. It may take up to one month for treatment to start relieving the pain. Keep all follow-up visits. This information is not intended to replace advice given to you by your health care provider. Make sure you discuss any questions you have with your health care provider. Document Revised: 05/03/2021 Document Reviewed: 04/02/2021 Elsevier  Patient Education  [place].

## 2022-09-30 NOTE — Telephone Encounter (Signed)
Pt scheduled for follow up on 01/23/23 at 7:45am

## 2022-10-02 ENCOUNTER — Other Ambulatory Visit (HOSPITAL_BASED_OUTPATIENT_CLINIC_OR_DEPARTMENT_OTHER): Payer: Self-pay

## 2022-10-07 ENCOUNTER — Other Ambulatory Visit (HOSPITAL_BASED_OUTPATIENT_CLINIC_OR_DEPARTMENT_OTHER): Payer: Self-pay

## 2022-10-09 ENCOUNTER — Other Ambulatory Visit: Payer: Self-pay

## 2022-10-09 ENCOUNTER — Other Ambulatory Visit (HOSPITAL_BASED_OUTPATIENT_CLINIC_OR_DEPARTMENT_OTHER): Payer: Self-pay

## 2022-10-11 ENCOUNTER — Other Ambulatory Visit (HOSPITAL_BASED_OUTPATIENT_CLINIC_OR_DEPARTMENT_OTHER): Payer: Self-pay

## 2022-10-11 ENCOUNTER — Telehealth: Payer: Self-pay | Admitting: Family Medicine

## 2022-10-15 ENCOUNTER — Other Ambulatory Visit (HOSPITAL_BASED_OUTPATIENT_CLINIC_OR_DEPARTMENT_OTHER): Payer: Self-pay

## 2022-10-15 MED ORDER — FLUOXETINE HCL 10 MG PO CAPS
ORAL_CAPSULE | ORAL | 0 refills | Status: DC
  Filled 2022-10-15: qty 45, 30d supply, fill #0

## 2022-10-15 NOTE — Addendum Note (Signed)
Addended by: Kavin Leech on: 10/15/2022 10:46 AM   Modules accepted: Orders

## 2022-10-15 NOTE — Telephone Encounter (Signed)
Patient refill request. Pharmacy sent in refill request but patient has not heard back from pharmacy or our office.  Please call patient [phone].

## 2022-10-15 NOTE — Telephone Encounter (Signed)
Pt sched for appt

## 2022-10-16 ENCOUNTER — Other Ambulatory Visit (HOSPITAL_BASED_OUTPATIENT_CLINIC_OR_DEPARTMENT_OTHER): Payer: Self-pay

## 2022-10-17 ENCOUNTER — Other Ambulatory Visit: Payer: Self-pay

## 2022-10-28 ENCOUNTER — Encounter: Payer: Self-pay | Admitting: Family Medicine

## 2022-10-28 ENCOUNTER — Ambulatory Visit: Payer: 59 | Admitting: Family Medicine

## 2022-10-28 ENCOUNTER — Other Ambulatory Visit: Payer: Self-pay

## 2022-10-28 ENCOUNTER — Ambulatory Visit: Payer: Commercial Managed Care - PPO | Admitting: Family Medicine

## 2022-10-28 ENCOUNTER — Other Ambulatory Visit (HOSPITAL_BASED_OUTPATIENT_CLINIC_OR_DEPARTMENT_OTHER): Payer: Self-pay

## 2022-10-28 VITALS — BP 106/69 | HR 85 | Temp 98.4°F | Ht 62.0 in | Wt 143.0 lb

## 2022-10-28 DIAGNOSIS — F419 Anxiety disorder, unspecified: Secondary | ICD-10-CM | POA: Diagnosis not present

## 2022-10-28 MED ORDER — FLUOXETINE HCL 10 MG PO CAPS
ORAL_CAPSULE | ORAL | 3 refills | Status: DC
  Filled 2022-10-28: qty 135, 30d supply, fill #0
  Filled 2022-11-08: qty 135, 90d supply, fill #0
  Filled 2023-04-03: qty 135, 90d supply, fill #1
  Filled 2023-07-20: qty 135, 90d supply, fill #2
  Filled 2023-10-17: qty 135, 90d supply, fill #3

## 2022-10-28 NOTE — Progress Notes (Signed)
Michele [name] , Apr 17, 1966, 57 y.o., female MRN: [id number] Patient Care Team    Relationship Specialty Notifications Start End  Ma Hillock, DO PCP - General Family Medicine  12/07/21   Dian Queen, MD Consulting Physician Obstetrics and Gynecology  12/07/21   Mansouraty, Telford Nab., MD Consulting Physician Gastroenterology  12/07/21   Iran Ouch, MD Referring Physician Ophthalmology  12/07/21   Melvenia Beam, MD Consulting Physician Neurology  12/07/21   Leanor Kail, [place] Physician Assistant Cardiology  12/07/21     Chief Complaint  Patient presents with   Anxiety    [facility]; pt is not fasting      Subjective: Pt presents for an OV to discuss her fluoxetine prescription.  She reports Fluoxetine 10/20- alternating daily doses is working well for her anxiety.  She has an upcoming shoulder surgery scheduled for next week.      10/28/2022    8:51 AM 12/07/2021   10:11 AM 01/12/2020    4:11 PM  Depression screen PHQ 2/9  Decreased Interest 0 0 3  Down, Depressed, Hopeless 0 0 2  PHQ - 2 Score 0 0 5    Allergies  Allergen Reactions   Lipitor [Atorvastatin Calcium] Other (See Comments)    Memory issue; myalgia   Celebrex [Celecoxib] Rash   Social History   Social History Narrative   Marital status/children/pets: 2 children.    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:      -Wears a bicycle helmet riding a bike: Yes     -smoke alarm in the home:Yes     - wears seatbelt: Yes     - Feels safe in their relationships: Yes      Past Medical History:  Diagnosis Date   Abdominal bloating    Anemia    Anxiety    Arthritis    Chicken pox    Chronic pain 11/20/2021   DDD (degenerative disc disease), cervical    Depression    Dorsalgia    Glaucoma    Hypercholesterolemia    Intestinal gas excretion    Migraine 08/17/2020   Nonallopathic lesion of cervical region 01/23/2021   l areas are chronic   Patient tolerated the procedure well with improvement in  symptoms  Patient given exercises, stretches and lifestyle modifications  See medications in patient instructions if given  Patient will follow up in 4-8 weeks   Peptic ulcer    Scapular dyskinesis 01/23/2021   Scarlet fever    Sequelae of other specified infectious and parasitic diseases 11/20/2021   Skin cancer    Tick bite 02/22/2018   UTI (urinary tract infection)    Yeast infection    Past Surgical History:  Procedure Laterality Date   BASAL CELL CARCINOMA EXCISION     BREAST BIOPSY Left 1992   Duct removal (No scar seen)    BREAST EXCISIONAL BIOPSY Left 1992   COLONOSCOPY  07/2021   DACRORHINOCYSTOTOMY  10/2020   LEFT HEART CATH AND CORONARY ANGIOGRAPHY N/A 01/16/2018   Procedure: LEFT HEART CATH AND CORONARY ANGIOGRAPHY;  Surgeon: Nelva Bush, MD;  Location: [facility];  Service: Cardiovascular;  Laterality: N/A;   UPPER GASTROINTESTINAL ENDOSCOPY  07/2021   Family History  Problem Relation Age of Onset   Hypertension Mother    Diabetes Mother    Diverticulitis Mother    Heart failure Mother    Hyperlipidemia Father    Diabetes Father    Heart attack Father  Arrhythmia Father    Heart attack Sister    Obesity Sister    Arthritis Sister    Asthma Sister    Alcohol abuse Brother    Stroke Maternal Uncle    Pancreatic cancer Maternal Uncle    Leukemia Maternal Grandmother    Diabetes Maternal Grandfather    Diabetes Paternal Grandmother    Heart attack Paternal Grandfather    Heart disease Paternal Grandfather    Migraines Daughter    Liver disease Other        Paternal great aunt   Breast cancer Neg Hx    Colon cancer Neg Hx    Esophageal cancer Neg Hx    Inflammatory bowel disease Neg Hx    Rectal cancer Neg Hx    Stomach cancer Neg Hx    Allergies as of 10/28/2022       Reactions   Lipitor [atorvastatin Calcium] Other (See Comments)   Memory issue; myalgia   Celebrex [celecoxib] Rash        Medication List        Accurate as of  October 28, 2022  9:10 AM. If you have any questions, ask your nurse or doctor.          aspirin EC 81 MG tablet Take 1 tablet by mouth every other day.   cyclobenzaprine 5 MG tablet Commonly known as: FLEXERIL Take 1 tablet (5 mg total) by mouth 3 (three) times daily as needed for muscle spasms.   FLUoxetine 10 MG capsule Commonly known as: PROZAC Take 1 capsule ('10mg'$  total) by mouth every other day alternating with 2 capsules ('20mg'$  total) by mouth every other day.   gabapentin 100 MG capsule Commonly known as: NEURONTIN Take 1 capsule (100 mg total) by mouth in the morning and the afternoon.   gabapentin 300 MG capsule Commonly known as: NEURONTIN Take 2 capsules (600 mg total) by mouth at bedtime.   hydrOXYzine 10 MG tablet Commonly known as: ATARAX Take 1-2 tablets (10-20 mg total) by mouth 3 (three) times daily as needed for itching.   progesterone 100 MG capsule Commonly known as: Prometrium Take 1 tablet by mouth every night at bedtime. Increase to 2 capsules at bedtime prior to cycle.   Vitamin D3 25 MCG/SPRAY Liqd Take 250 mcg by mouth daily.        All past medical history, surgical history, allergies, family history, immunizations andmedications were updated in the EMR today and reviewed under the history and medication portions of their EMR.     ROS Negative, with the exception of above mentioned in HPI   Objective:  BP 106/69   Pulse 85   Temp 98.4 F (36.9 C) (Oral)   Ht '5\' 2"'$  (1.575 m)   Wt 143 lb (64.9 kg)   LMP 11/29/2021   SpO2 100%   BMI 26.16 kg/m  Body mass index is 26.16 kg/m. Physical Exam Vitals and nursing note reviewed.  Constitutional:      General: She is not in acute distress.    Appearance: Normal appearance. She is normal weight. She is not ill-appearing or toxic-appearing.  Eyes:     Extraocular Movements: Extraocular movements intact.     Conjunctiva/sclera: Conjunctivae normal.     Pupils: Pupils are equal, round, and  reactive to light.  Neurological:     Mental Status: She is alert and oriented to person, place, and time. Mental status is at baseline.  Psychiatric:        Mood and  Affect: Mood normal.        Behavior: Behavior normal.        Thought Content: Thought content normal.        Judgment: Judgment normal.    No results found. No results found. No results found for this or any previous visit (from the past 24 hour(s)).  Assessment/Plan: Michele [name] is a 57 y.o. female present for OV for  Anxiety Stable Continue prozac 10/20 alternating day doses.  F/u yearly for cpe and [facility] combination appt. Sooner if needed  Reviewed expectations re: course of current medical issues. Discussed self-management of symptoms. Outlined signs and symptoms indicating need for more acute intervention. Patient verbalized understanding and all questions were answered. Patient received an After-Visit Summary.   No orders of the defined types were placed in this encounter.  Meds ordered this encounter  Medications   FLUoxetine (PROZAC) 10 MG capsule    Sig: Take 1 capsule ('10mg'$  total) by mouth every other day alternating with 2 capsules ('20mg'$  total) by mouth every other day.    Dispense:  135 capsule    Refill:  3   Referral Orders  No referral(s) requested today     Note is dictated utilizing voice recognition software. Although note has been proof read prior to signing, occasional typographical errors still can be missed. If any questions arise, please do not hesitate to call for verification.   electronically signed by:  Howard Pouch, DO  [place]

## 2022-10-30 ENCOUNTER — Other Ambulatory Visit (HOSPITAL_BASED_OUTPATIENT_CLINIC_OR_DEPARTMENT_OTHER): Payer: Self-pay

## 2022-10-30 MED ORDER — OXYCODONE HCL 5 MG PO TABS
5.0000 mg | ORAL_TABLET | ORAL | 0 refills | Status: DC
  Filled 2022-11-11: qty 30, 5d supply, fill #0

## 2022-10-31 ENCOUNTER — Other Ambulatory Visit (HOSPITAL_BASED_OUTPATIENT_CLINIC_OR_DEPARTMENT_OTHER): Payer: Self-pay

## 2022-11-04 ENCOUNTER — Other Ambulatory Visit: Payer: Self-pay | Admitting: Orthopedic Surgery

## 2022-11-06 ENCOUNTER — Encounter (HOSPITAL_BASED_OUTPATIENT_CLINIC_OR_DEPARTMENT_OTHER): Payer: Self-pay | Admitting: Orthopedic Surgery

## 2022-11-06 ENCOUNTER — Other Ambulatory Visit: Payer: Self-pay

## 2022-11-06 NOTE — Progress Notes (Signed)
   11/06/22 1636  PAT Phone Screen  Is the patient taking a GLP-1 receptor agonist? No  Do You Have Diabetes? No  Do You Have Hypertension? No  Have You Ever Been to the ER for Asthma? No  Have You Taken Oral Steroids in the Past 3 Months? No  Do you Take Phenteramine or any Other Diet Drugs? No  Recent  Lab Work, EKG, CXR? No  Do you have a history of heart problems? No  Have you ever had tests on your heart? Yes  What cardiac tests were performed? Cardiac Cath  What date/year were cardiac tests completed? Negative cath 01/16/2018- done for family hx of CAD  Results viewable: [facility] Media Tab  Any Recent Hospitalizations? No  Height '5\' 2"'$  (1.575 m)  Weight 64.9 kg  Pat Appointment Scheduled No  Reason for No Appointment Not Needed

## 2022-11-08 ENCOUNTER — Other Ambulatory Visit (HOSPITAL_BASED_OUTPATIENT_CLINIC_OR_DEPARTMENT_OTHER): Payer: Self-pay

## 2022-11-11 ENCOUNTER — Other Ambulatory Visit (HOSPITAL_BASED_OUTPATIENT_CLINIC_OR_DEPARTMENT_OTHER): Payer: Self-pay

## 2022-11-11 NOTE — Progress Notes (Signed)

## 2022-11-12 ENCOUNTER — Encounter (HOSPITAL_BASED_OUTPATIENT_CLINIC_OR_DEPARTMENT_OTHER): Payer: Self-pay | Admitting: Orthopedic Surgery

## 2022-11-12 ENCOUNTER — Encounter (HOSPITAL_BASED_OUTPATIENT_CLINIC_OR_DEPARTMENT_OTHER)
Admission: RE | Disposition: A | Discharge status: Home / Self Care | Payer: Self-pay | Source: Ambulatory Visit | Attending: Orthopedic Surgery

## 2022-11-12 ENCOUNTER — Other Ambulatory Visit (HOSPITAL_BASED_OUTPATIENT_CLINIC_OR_DEPARTMENT_OTHER): Payer: Self-pay

## 2022-11-12 ENCOUNTER — Ambulatory Visit (HOSPITAL_BASED_OUTPATIENT_CLINIC_OR_DEPARTMENT_OTHER): Payer: 59 | Admitting: Anesthesiology

## 2022-11-12 ENCOUNTER — Ambulatory Visit (HOSPITAL_BASED_OUTPATIENT_CLINIC_OR_DEPARTMENT_OTHER)
Admission: RE | Admit: 2022-11-12 | Discharge: 2022-11-12 | Disposition: A | Discharge status: Home / Self Care | Payer: 59 | Source: Ambulatory Visit | Attending: Orthopedic Surgery | Admitting: Orthopedic Surgery

## 2022-11-12 DIAGNOSIS — F419 Anxiety disorder, unspecified: Secondary | ICD-10-CM | POA: Diagnosis not present

## 2022-11-12 DIAGNOSIS — F32A Depression, unspecified: Secondary | ICD-10-CM | POA: Insufficient documentation

## 2022-11-12 DIAGNOSIS — M1990 Unspecified osteoarthritis, unspecified site: Secondary | ICD-10-CM | POA: Diagnosis not present

## 2022-11-12 DIAGNOSIS — S46012A Strain of muscle(s) and tendon(s) of the rotator cuff of left shoulder, initial encounter: Secondary | ICD-10-CM | POA: Diagnosis not present

## 2022-11-12 DIAGNOSIS — G8918 Other acute postprocedural pain: Secondary | ICD-10-CM | POA: Diagnosis not present

## 2022-11-12 DIAGNOSIS — M75112 Incomplete rotator cuff tear or rupture of left shoulder, not specified as traumatic: Secondary | ICD-10-CM

## 2022-11-12 DIAGNOSIS — M25812 Other specified joint disorders, left shoulder: Secondary | ICD-10-CM | POA: Insufficient documentation

## 2022-11-12 DIAGNOSIS — M7542 Impingement syndrome of left shoulder: Secondary | ICD-10-CM | POA: Diagnosis not present

## 2022-11-12 HISTORY — PX: SHOULDER ARTHROSCOPY WITH ROTATOR CUFF REPAIR AND SUBACROMIAL DECOMPRESSION: SHX5686

## 2022-11-12 SURGERY — SHOULDER ARTHROSCOPY WITH ROTATOR CUFF REPAIR AND SUBACROMIAL DECOMPRESSION
Anesthesia: General | Site: Shoulder | Laterality: Left

## 2022-11-12 MED ORDER — ACETAMINOPHEN 500 MG PO TABS
ORAL_TABLET | ORAL | Status: AC
  Filled 2022-11-12: qty 2

## 2022-11-12 MED ORDER — FENTANYL CITRATE (PF) 100 MCG/2ML IJ SOLN
INTRAMUSCULAR | Status: AC
  Filled 2022-11-12: qty 2

## 2022-11-12 MED ORDER — MIDAZOLAM HCL 2 MG/2ML IJ SOLN
INTRAMUSCULAR | Status: AC
  Filled 2022-11-12: qty 2

## 2022-11-12 MED ORDER — ONDANSETRON HCL 4 MG/2ML IJ SOLN
INTRAMUSCULAR | Status: DC | PRN
  Administered 2022-11-12: 4 mg via INTRAVENOUS

## 2022-11-12 MED ORDER — FENTANYL CITRATE (PF) 100 MCG/2ML IJ SOLN
100.0000 ug | Freq: Once | INTRAMUSCULAR | Status: AC
  Administered 2022-11-12: 100 ug via INTRAVENOUS

## 2022-11-12 MED ORDER — DEXAMETHASONE SODIUM PHOSPHATE 10 MG/ML IJ SOLN
INTRAMUSCULAR | Status: AC
  Filled 2022-11-12: qty 1

## 2022-11-12 MED ORDER — DEXAMETHASONE SODIUM PHOSPHATE 4 MG/ML IJ SOLN
INTRAMUSCULAR | Status: DC | PRN
  Administered 2022-11-12: 5 mg via INTRAVENOUS

## 2022-11-12 MED ORDER — BUPIVACAINE HCL (PF) 0.5 % IJ SOLN
INTRAMUSCULAR | Status: DC | PRN
  Administered 2022-11-12: 20 mL via PERINEURAL

## 2022-11-12 MED ORDER — ONDANSETRON HCL 4 MG/2ML IJ SOLN: 4.0000 mg | Freq: Once | INTRAMUSCULAR | Status: DC | PRN

## 2022-11-12 MED ORDER — CEFAZOLIN SODIUM-DEXTROSE 2-4 GM/100ML-% IV SOLN
INTRAVENOUS | Status: AC
  Filled 2022-11-12: qty 100

## 2022-11-12 MED ORDER — OXYCODONE HCL 5 MG/5ML PO SOLN: 5.0000 mg | Freq: Once | ORAL | Status: DC | PRN

## 2022-11-12 MED ORDER — EPHEDRINE 5 MG/ML INJ
INTRAVENOUS | Status: AC
  Filled 2022-11-12: qty 5

## 2022-11-12 MED ORDER — SUGAMMADEX SODIUM 200 MG/2ML IV SOLN
INTRAVENOUS | Status: DC | PRN
  Administered 2022-11-12: 200 mg via INTRAVENOUS

## 2022-11-12 MED ORDER — PHENYLEPHRINE HCL (PRESSORS) 10 MG/ML IV SOLN
INTRAVENOUS | Status: DC | PRN
  Administered 2022-11-12 (×2): 80 ug via INTRAVENOUS

## 2022-11-12 MED ORDER — ONDANSETRON 4 MG PO TBDP
4.0000 mg | ORAL_TABLET | Freq: Once | ORAL | Status: AC
  Administered 2022-11-12: 4 mg via ORAL

## 2022-11-12 MED ORDER — SODIUM CHLORIDE 0.9 % IR SOLN
Status: DC | PRN
  Administered 2022-11-12: 3000 mL

## 2022-11-12 MED ORDER — OXYCODONE-ACETAMINOPHEN 5-325 MG PO TABS
1.0000 | ORAL_TABLET | ORAL | 0 refills | Status: DC | PRN
  Filled 2022-11-12 – 2022-11-27 (×2): qty 20, 4d supply, fill #0

## 2022-11-12 MED ORDER — ROCURONIUM BROMIDE 10 MG/ML (PF) SYRINGE
PREFILLED_SYRINGE | INTRAVENOUS | Status: AC
  Filled 2022-11-12: qty 10

## 2022-11-12 MED ORDER — ROCURONIUM BROMIDE 100 MG/10ML IV SOLN
INTRAVENOUS | Status: DC | PRN
  Administered 2022-11-12: 70 mg via INTRAVENOUS

## 2022-11-12 MED ORDER — LACTATED RINGERS IV SOLN: INTRAVENOUS | Status: DC

## 2022-11-12 MED ORDER — MIDAZOLAM HCL 5 MG/5ML IJ SOLN
INTRAMUSCULAR | Status: DC | PRN
  Administered 2022-11-12: 50 mg via INTRAVENOUS
  Administered 2022-11-12: 1 mg via INTRAVENOUS

## 2022-11-12 MED ORDER — OXYCODONE HCL 5 MG PO TABS: 5.0000 mg | ORAL_TABLET | Freq: Once | ORAL | Status: DC | PRN

## 2022-11-12 MED ORDER — MIDAZOLAM HCL 2 MG/2ML IJ SOLN
2.0000 mg | Freq: Once | INTRAMUSCULAR | Status: AC
  Administered 2022-11-12: 2 mg via INTRAVENOUS

## 2022-11-12 MED ORDER — PROPOFOL 10 MG/ML IV BOLUS
INTRAVENOUS | Status: DC | PRN
  Administered 2022-11-12: 100 mg via INTRAVENOUS

## 2022-11-12 MED ORDER — FENTANYL CITRATE (PF) 100 MCG/2ML IJ SOLN: 25.0000 ug | INTRAMUSCULAR | Status: DC | PRN

## 2022-11-12 MED ORDER — BUPIVACAINE LIPOSOME 1.3 % IJ SUSP
INTRAMUSCULAR | Status: DC | PRN
  Administered 2022-11-12: 10 mL via PERINEURAL

## 2022-11-12 MED ORDER — LIDOCAINE 2% (20 MG/ML) 5 ML SYRINGE
INTRAMUSCULAR | Status: AC
  Filled 2022-11-12: qty 5

## 2022-11-12 MED ORDER — CEFAZOLIN SODIUM-DEXTROSE 2-4 GM/100ML-% IV SOLN
2.0000 g | INTRAVENOUS | Status: AC
  Administered 2022-11-12: 2 g via INTRAVENOUS

## 2022-11-12 MED ORDER — ONDANSETRON HCL 4 MG/2ML IJ SOLN
INTRAMUSCULAR | Status: AC
  Filled 2022-11-12: qty 2

## 2022-11-12 MED ORDER — KETOROLAC TROMETHAMINE 30 MG/ML IJ SOLN: 30.0000 mg | Freq: Once | INTRAMUSCULAR | Status: DC | PRN

## 2022-11-12 MED ORDER — SUCCINYLCHOLINE CHLORIDE 200 MG/10ML IV SOSY
PREFILLED_SYRINGE | INTRAVENOUS | Status: AC
  Filled 2022-11-12: qty 10

## 2022-11-12 MED ORDER — ONDANSETRON 4 MG PO TBDP
ORAL_TABLET | ORAL | Status: AC
  Filled 2022-11-12: qty 1

## 2022-11-12 MED ORDER — PHENYLEPHRINE 80 MCG/ML (10ML) SYRINGE FOR IV PUSH (FOR BLOOD PRESSURE SUPPORT)
PREFILLED_SYRINGE | INTRAVENOUS | Status: AC
  Filled 2022-11-12: qty 10

## 2022-11-12 SURGICAL SUPPLY — 55 items
AID PSTN UNV HD RSTRNT DISP (MISCELLANEOUS) ×1
APL PRP STRL LF DISP 70% ISPRP (MISCELLANEOUS) ×1
APL SKNCLS STERI-STRIP NONHPOA (GAUZE/BANDAGES/DRESSINGS)
BENZOIN TINCTURE PRP APPL 2/3 (GAUZE/BANDAGES/DRESSINGS) IMPLANT
BURR OVAL 8 FLU 4.0X13 (MISCELLANEOUS) ×1 IMPLANT
CANNULA 5.75X7 CRYSTAL CLEAR (CANNULA) ×1 IMPLANT
CANNULA TWIST IN 8.25X7CM (CANNULA) IMPLANT
CHLORAPREP W/TINT 26 (MISCELLANEOUS) ×1 IMPLANT
CUTTER BONE 4.0MM X 13CM (MISCELLANEOUS) ×1 IMPLANT
DRAPE IMP U-DRAPE 54X76 (DRAPES) ×1 IMPLANT
DRAPE INCISE IOBAN 66X45 STRL (DRAPES) ×1 IMPLANT
DRAPE POUCH INSTRU U-SHP 10X18 (DRAPES) ×1 IMPLANT
DRAPE STERI 35X30 U-POUCH (DRAPES) ×1 IMPLANT
DRAPE SURG 17X23 STRL (DRAPES) ×1 IMPLANT
DRAPE U-SHAPE 47X51 STRL (DRAPES) ×1 IMPLANT
DRAPE U-SHAPE 76X120 STRL (DRAPES) ×2 IMPLANT
ELECT REM PT RETURN 9FT ADLT (ELECTROSURGICAL) ×1
ELECTRODE REM PT RTRN 9FT ADLT (ELECTROSURGICAL) ×1 IMPLANT
GAUZE 4X4 16PLY ~~LOC~~+RFID DBL (SPONGE) IMPLANT
GAUZE PAD ABD 8X10 STRL (GAUZE/BANDAGES/DRESSINGS) ×1 IMPLANT
GAUZE SPONGE 4X4 12PLY STRL (GAUZE/BANDAGES/DRESSINGS) ×1 IMPLANT
GAUZE XEROFORM 1X8 LF (GAUZE/BANDAGES/DRESSINGS) ×1 IMPLANT
GLOVE BIO SURGEON STRL SZ7.5 (GLOVE) ×1 IMPLANT
GLOVE BIOGEL PI IND STRL 6.5 (GLOVE) ×1 IMPLANT
GLOVE BIOGEL PI IND STRL 8 (GLOVE) ×1 IMPLANT
GLOVE SURG SS PI 6.5 STRL IVOR (GLOVE) ×1 IMPLANT
GOWN STRL REUS W/ TWL LRG LVL3 (GOWN DISPOSABLE) ×2 IMPLANT
GOWN STRL REUS W/ TWL XL LVL3 (GOWN DISPOSABLE) ×1 IMPLANT
GOWN STRL REUS W/TWL LRG LVL3 (GOWN DISPOSABLE) ×2
GOWN STRL REUS W/TWL XL LVL3 (GOWN DISPOSABLE) ×1
LASSO CRESCENT QUICKPASS (SUTURE) IMPLANT
MANIFOLD NEPTUNE II (INSTRUMENTS) ×1 IMPLANT
NDL HD SCORPION MEGA LOADER (NEEDLE) IMPLANT
NS IRRIG 1000ML POUR BTL (IV SOLUTION) IMPLANT
PACK ARTHROSCOPY DSU (CUSTOM PROCEDURE TRAY) ×1 IMPLANT
PACK BASIN DAY SURGERY FS (CUSTOM PROCEDURE TRAY) ×1 IMPLANT
PROBE BIPOLAR ATHRO 135MM 90D (MISCELLANEOUS) ×1 IMPLANT
RESTRAINT HEAD UNIVERSAL NS (MISCELLANEOUS) ×1 IMPLANT
SLEEVE SCD COMPRESS KNEE MED (STOCKING) ×1 IMPLANT
SLING ARM FOAM STRAP LRG (SOFTGOODS) IMPLANT
SPIKE FLUID TRANSFER (MISCELLANEOUS) IMPLANT
STRIP CLOSURE SKIN 1/2X4 (GAUZE/BANDAGES/DRESSINGS) IMPLANT
SUPPORT WRAP ARM LG (MISCELLANEOUS) ×1 IMPLANT
SUT ETHILON 3 0 PS 1 (SUTURE) ×1 IMPLANT
SUT FIBERWIRE #2 38 T-5 BLUE (SUTURE)
SUT PDS AB 0 CT 36 (SUTURE) IMPLANT
SUT TIGER TAPE 7 IN WHITE (SUTURE) IMPLANT
SUTURE FIBERWR #2 38 T-5 BLUE (SUTURE) IMPLANT
SUTURE TAPE 1.3 40 TPR END (SUTURE) IMPLANT
SUTURETAPE 1.3 40 TPR END (SUTURE)
TAPE FIBER 2MM 7IN #2 BLUE (SUTURE) IMPLANT
TOWEL GREEN STERILE FF (TOWEL DISPOSABLE) ×1 IMPLANT
TUBE CONNECTING 20X1/4 (TUBING) ×1 IMPLANT
TUBING ARTHROSCOPY IRRIG 16FT (MISCELLANEOUS) ×1 IMPLANT
WATER STERILE IRR 1000ML POUR (IV SOLUTION) ×1 IMPLANT

## 2022-11-12 NOTE — Anesthesia Procedure Notes (Signed)
Anesthesia Procedure Image       
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
## 2022-11-12 NOTE — Anesthesia Procedure Notes (Signed)
Procedure Name: Intubation Date/Time: 11/12/2022 1:38 PM  Performed by: Willa Frater, CRNAPre-anesthesia Checklist: Patient identified, Emergency Drugs available, Suction available and Patient being monitored Patient Re-evaluated:Patient Re-evaluated prior to induction Oxygen Delivery Method: Circle system utilized Preoxygenation: Pre-oxygenation with 100% oxygen Induction Type: IV induction Ventilation: Mask ventilation without difficulty Laryngoscope Size: Mac and 3 Grade View: Grade I Tube type: Oral Tube size: 7.0 mm Number of attempts: 1 Airway Equipment and Method: Stylet and Oral airway Placement Confirmation: ETT inserted through vocal cords under direct vision, positive ETCO2 and breath sounds checked- equal and bilateral Secured at: 22 cm Tube secured with: Tape Dental Injury: Teeth and Oropharynx as per pre-operative assessment

## 2022-11-12 NOTE — Anesthesia Preprocedure Evaluation (Signed)
Anesthesia Evaluation  Patient identified by MRN, date of birth, ID band Patient awake    Reviewed: Allergy & Precautions, H&P , NPO status , Patient's Chart, lab work & pertinent test results  Airway Mallampati: II  TM Distance: >3 FB Neck ROM: Full    Dental no notable dental hx.    Pulmonary neg pulmonary ROS   Pulmonary exam normal breath sounds clear to auscultation       Cardiovascular negative cardio ROS Normal cardiovascular exam Rhythm:Regular Rate:Normal     Neuro/Psych   Anxiety Depression    negative neurological ROS     GI/Hepatic Neg liver ROS,GERD  ,,  Endo/Other  negative endocrine ROS    Renal/GU negative Renal ROS  negative genitourinary   Musculoskeletal  (+) Arthritis , Osteoarthritis,    Abdominal   Peds negative pediatric ROS (+)  Hematology negative hematology ROS (+)   Anesthesia Other Findings   Reproductive/Obstetrics negative OB ROS                             Anesthesia Physical Anesthesia Plan  ASA: 2  Anesthesia Plan: General   Post-op Pain Management: Regional block*   Induction: Intravenous  PONV Risk Score and Plan: 3 and Ondansetron, Dexamethasone and Treatment may vary due to age or medical condition  Airway Management Planned: Oral ETT  Additional Equipment:   Intra-op Plan:   Post-operative Plan: Extubation in OR  Informed Consent: I have reviewed the patients History and Physical, chart, labs and discussed the procedure including the risks, benefits and alternatives for the proposed anesthesia with the patient or authorized representative who has indicated his/her understanding and acceptance.     Dental advisory given  Plan Discussed with: CRNA and Surgeon  Anesthesia Plan Comments:        Anesthesia Quick Evaluation

## 2022-11-12 NOTE — Progress Notes (Signed)
Assisted Dr. Kalman Shan with left, interscalene , ultrasound guided block. Side rails up, monitors on throughout procedure. See vital signs in flow sheet. Tolerated Procedure well.

## 2022-11-12 NOTE — Anesthesia Procedure Notes (Signed)
Anesthesia Regional Block: Interscalene brachial plexus block   Pre-Anesthetic Checklist: , timeout performed,  Correct Patient, Correct Site, Correct Laterality,  Correct Procedure, Correct Position, site marked,  Risks and benefits discussed,  Surgical consent,  Pre-op evaluation,  At surgeon's request and post-op pain management  Laterality: Left  Prep: chloraprep       Needles:  Injection technique: Single-shot  Needle Type: Echogenic Stimulator Needle     Needle Length: 9cm      Additional Needles:   Procedures:,,,, ultrasound used (permanent image in chart),,     Nerve Stimulator or Paresthesia:  Response: 0.4 mA  Additional Responses:   Narrative:  Start time: 11/12/2022 12:17 PM End time: 11/12/2022 12:25 PM Injection made incrementally with aspirations every 5 mL.  Performed by: Personally  Anesthesiologist: Myrtie Soman, MD  Additional Notes: Patient tolerated the procedure well without complications

## 2022-11-12 NOTE — Discharge Instructions (Addendum)
Discharge Instructions after Arthroscopic Shoulder Surgery   A sling has been provided for you. You may remove the sling after 72 hours. The sling may be worn for your protection, if you are in a crowd.  Use ice on the shoulder intermittently over the first 48 hours after surgery.  Pain medication has been prescribed for you.  Use your medication liberally over the first 48 hours, and then begin to taper your use. You may take Extra Strength Tylenol or Tylenol only in place of the pain pills. DO NOT take ANY nonsteroidal anti-inflammatory pain medications: Advil, Motrin, Ibuprofen, Aleve, Naproxen, or Naprosyn.  You may remove your dressing after two days.  You may shower 5 days after surgery. The incision CANNOT get wet prior to 5 days. Simply allow the water to wash over the site and then pat dry. Do not rub the incision. Make sure your axilla (armpit) is completely dry after showering.  Take one aspirin a day for 2 weeks after surgery, unless you have an aspirin sensitivity/allergy or asthma.  Three to 5 times each day you should perform assisted overhead reaching and external rotation (outward turning) exercises with the operative arm. Both exercises should be done with the non-operative arm used as the "therapist arm" while the operative arm remains relaxed. Ten of each exercise should be done three to five times each day.    Overhead reach is helping to lift your stiff arm up as high as it will go. To stretch your overhead reach, lie flat on your back, relax, and grasp the wrist of the tight shoulder with your opposite hand. Using the power in your opposite arm, bring the stiff arm up as far as it is comfortable. Start holding it for ten seconds and then work up to where you can hold it for a count of 30. Breathe slowly and deeply while the arm is moved. Repeat this stretch ten times, trying to help the arm up a little higher each time.       External rotation is turning the arm out to  the side while your elbow stays close to your body. External rotation is best stretched while you are lying on your back. Hold a cane, yardstick, broom handle, or dowel in both hands. Bend both elbows to a right angle. Use steady, gentle force from your normal arm to rotate the hand of the stiff shoulder out away from your body. Continue the rotation as far as it will go comfortably, holding it there for a count of 10. Repeat this exercise ten times.     Please call [phone] during normal business hours or [phone] after hours for any problems. Including the following:  - excessive redness of the incisions - drainage for more than 4 days - fever of more than 101.5 F  *Please note that pain medications will not be refilled after hours or on weekends.    Post Anesthesia Home Care Instructions  Activity: Get plenty of rest for the remainder of the day. A responsible individual must stay with you for 24 hours following the procedure.  For the next 24 hours, DO NOT: -Drive a car -Paediatric nurse -Drink alcoholic beverages -Take any medication unless instructed by your physician -Make any legal decisions or sign important papers.  Meals: Start with liquid foods such as gelatin or soup. Progress to regular foods as tolerated. Avoid greasy, spicy, heavy foods. If nausea and/or vomiting occur, drink only clear liquids until the nausea and/or vomiting subsides. Call  your physician if vomiting continues.  Special Instructions/Symptoms: Your throat may feel dry or sore from the anesthesia or the breathing tube placed in your throat during surgery. If this causes discomfort, gargle with warm salt water. The discomfort should disappear within 24 hours.  If you had a scopolamine patch placed behind your ear for the management of post- operative nausea and/or vomiting:  1. The medication in the patch is effective for 72 hours, after which it should be removed.  Wrap patch in a tissue and  discard in the trash. Wash hands thoroughly with soap and water. 2. You may remove the patch earlier than 72 hours if you experience unpleasant side effects which may include dry mouth, dizziness or visual disturbances. 3. Avoid touching the patch. Wash your hands with soap and water after contact with the patch.    Regional Anesthesia Blocks  1. Numbness or the inability to move the "blocked" extremity may last from 3-48 hours after placement. The length of time depends on the medication injected and your individual response to the medication. If the numbness is not going away after 48 hours, call your surgeon.  2. The extremity that is blocked will need to be protected until the numbness is gone and the  Strength has returned. Because you cannot feel it, you will need to take extra care to avoid injury. Because it may be weak, you may have difficulty moving it or using it. You may not know what position it is in without looking at it while the block is in effect.  3. For blocks in the legs and feet, returning to weight bearing and walking needs to be done carefully. You will need to wait until the numbness is entirely gone and the strength has returned. You should be able to move your leg and foot normally before you try and bear weight or walk. You will need someone to be with you when you first try to ensure you do not fall and possibly risk injury.  4. Bruising and tenderness at the needle site are common side effects and will resolve in a few days.  5. Persistent numbness or new problems with movement should be communicated to the surgeon or the [place] [phone] [place] [phone]).

## 2022-11-12 NOTE — Progress Notes (Signed)
Time taken was in error correct time 13:27; created new assessment with correct time

## 2022-11-12 NOTE — Transfer of Care (Signed)
Immediate Anesthesia Transfer of Care Note  Patient: Michele [name]  Procedure(s) Performed: SHOULDER ARTHROSCOPY WITH ROTATOR CUFF DEBBRIDEMENT AND SUBACROMIAL DECOMPRESSION (Left: Shoulder)  Patient Location: PACU  Anesthesia Type:GA combined with regional for post-op pain  Level of Consciousness: awake, drowsy, and patient cooperative  Airway & Oxygen Therapy: Patient Spontanous Breathing and Patient connected to face mask oxygen  Post-op Assessment: Report given to RN and Post -op Vital signs reviewed and stable  Post vital signs: Reviewed and stable  Last Vitals:  Vitals Value Taken Time  BP    Temp    Pulse 106 11/12/22 1433  Resp    SpO2 93 % 11/12/22 1433  Vitals shown include unvalidated device data.  Last Pain:  Vitals:   11/12/22 1122  TempSrc: Oral  PainSc:       Patients Stated Pain Goal: 4 (34/96/11 6435)  Complications: No notable events documented.

## 2022-11-12 NOTE — H&P (Signed)
Michele [name] is an 57 y.o. female.   Chief Complaint: L shoulder pain HPI: L shoulder partial RCT, failed conservative tx.  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   DDD (degenerative disc disease), cervical    Depression    Dorsalgia    Glaucoma    Hypercholesterolemia    Intestinal gas excretion    Memory loss 01/13/2020   Migraine 08/17/2020   Nonallopathic lesion of cervical region 01/23/2021   l areas are chronic   Patient tolerated the procedure well with improvement in symptoms  Patient given exercises, stretches and lifestyle modifications  See medications in patient instructions if given  Patient will follow up in 4-8 weeks   Peptic ulcer    Scapular dyskinesis 01/23/2021   Scarlet fever    Sequelae of other specified infectious and parasitic diseases 11/20/2021   Skin cancer    Tick bite 02/22/2018   UTI (urinary tract infection)    Yeast infection     Past Surgical History:  Procedure Laterality Date   BASAL CELL CARCINOMA EXCISION     BREAST BIOPSY Left 1992   Duct removal (No scar seen)    BREAST EXCISIONAL BIOPSY Left 1992   COLONOSCOPY  07/2021   DACRORHINOCYSTOTOMY  10/2020   LEFT HEART CATH AND CORONARY ANGIOGRAPHY N/A 01/16/2018   Procedure: LEFT HEART CATH AND CORONARY ANGIOGRAPHY;  Surgeon: Nelva Bush, MD;  Location: [facility];  Service: Cardiovascular;  Laterality: N/A;   UPPER GASTROINTESTINAL ENDOSCOPY  07/2021    Family History  Problem Relation Age of Onset   Hypertension Mother    Diabetes Mother    Diverticulitis Mother    Heart failure Mother    Hyperlipidemia Father    Diabetes Father    Heart attack Father    Arrhythmia Father    Heart attack Sister    Obesity Sister    Arthritis Sister     Asthma Sister    Alcohol abuse Brother    Stroke Maternal Uncle    Pancreatic cancer Maternal Uncle    Leukemia Maternal Grandmother    Diabetes Maternal Grandfather    Diabetes Paternal Grandmother    Heart attack Paternal Grandfather    Heart disease Paternal Grandfather    Migraines Daughter    Liver disease Other        Paternal great aunt   Breast cancer Neg Hx    Colon cancer Neg Hx    Esophageal cancer Neg Hx    Inflammatory bowel disease Neg Hx    Rectal cancer Neg Hx    Stomach cancer Neg Hx    Social History:  reports that she has never smoked. She has never been exposed to tobacco smoke. She has never used smokeless tobacco. She reports current alcohol use of about 5.0 standard drinks of alcohol per week. She reports that she does not use drugs.  Allergies:  Allergies  Allergen Reactions   Lipitor [Atorvastatin Calcium] Other (See Comments)    Memory issue; myalgia   Celebrex [Celecoxib] Rash    Medications Prior to Admission  Medication Sig Dispense Refill   aspirin EC 81 MG tablet Take 1 tablet by mouth every other day.     Cholecalciferol (VITAMIN D3)  25 MCG/SPRAY LIQD Take 250 mcg by mouth daily.     cyclobenzaprine (FLEXERIL) 5 MG tablet Take 1 tablet (5 mg total) by mouth 3 (three) times daily as needed for muscle spasms. 90 tablet 0   FLUoxetine (PROZAC) 10 MG capsule Take 1 capsule ('10mg'$  total) by mouth every other day alternating with 2 capsules ('20mg'$  total) by mouth every other day. 135 capsule 3   gabapentin (NEURONTIN) 100 MG capsule Take 1 capsule (100 mg total) by mouth in the morning and the afternoon. 180 capsule 1   gabapentin (NEURONTIN) 300 MG capsule Take 2 capsules (600 mg total) by mouth at bedtime. 180 capsule 1   hydrOXYzine (ATARAX) 10 MG tablet Take 1-2 tablets (10-20 mg total) by mouth 3 (three) times daily as needed for itching. 30 tablet 1   progesterone (PROMETRIUM) 100 MG capsule Take 1 tablet by mouth every night at bedtime. Increase to  2 capsules at bedtime prior to cycle. 120 capsule 2   oxyCODONE (OXY IR/ROXICODONE) 5 MG immediate release tablet Take 1 tablet (5 mg total) by mouth every 4 to 6 hours as needed for pain 30 tablet 0    No results found for this or any previous visit (from the past 48 hour(s)). No results found.  Review of Systems  All other systems reviewed and are negative.   Blood pressure 114/79, pulse 74, temperature (!) 97.1 F (36.2 C), temperature source Oral, resp. rate 17, height '5\' 2"'$  (1.575 m), weight 63.1 kg, last menstrual period 11/29/2021, SpO2 98 %. Physical Exam HENT:     Head: Atraumatic.  Cardiovascular:     Pulses: Normal pulses.  Musculoskeletal:     Comments: L shoulder pain with RC testing. NVID  Neurological:     Mental Status: She is alert.      Assessment/Plan L shoulder partial RCT, failed conservative tx. Plan L shoulder arthroscopy Risks / benefits of surgery discussed Consent on chart  NPO for OR Preop antibiotics   Rhae Hammock, MD 11/12/2022, 1:22 PM

## 2022-11-13 ENCOUNTER — Encounter (HOSPITAL_BASED_OUTPATIENT_CLINIC_OR_DEPARTMENT_OTHER): Payer: Self-pay | Admitting: Orthopedic Surgery

## 2022-11-13 NOTE — Anesthesia Postprocedure Evaluation (Signed)
Anesthesia Post Note  Patient: Michele [name]  Procedure(s) Performed: SHOULDER ARTHROSCOPY WITH ROTATOR CUFF DEBBRIDEMENT AND SUBACROMIAL DECOMPRESSION (Left: Shoulder)     Patient location during evaluation: PACU Anesthesia Type: General and Regional Level of consciousness: awake and alert Pain management: pain level controlled Vital Signs Assessment: post-procedure vital signs reviewed and stable Respiratory status: spontaneous breathing, nonlabored ventilation, respiratory function stable and patient connected to nasal cannula oxygen Cardiovascular status: blood pressure returned to baseline and stable Postop Assessment: no apparent nausea or vomiting Anesthetic complications: no  No notable events documented.  Last Vitals:  Vitals:   11/12/22 1500 11/12/22 1514  BP: 116/80 128/89  Pulse: 93 87  Resp: 15 16  Temp: (!) 36.4 C (!) 36.4 C  SpO2: 93% 94%    Last Pain:  Vitals:   11/12/22 1514  TempSrc:   PainSc: 0-No pain                 Simra Fiebig L Najah Liverman

## 2022-11-22 ENCOUNTER — Other Ambulatory Visit (HOSPITAL_BASED_OUTPATIENT_CLINIC_OR_DEPARTMENT_OTHER): Payer: Self-pay

## 2022-11-22 MED ORDER — HYDROXYZINE HCL 10 MG PO TABS
10.0000 mg | ORAL_TABLET | Freq: Three times a day (TID) | ORAL | 1 refills | Status: DC
  Filled 2022-11-22: qty 30, 5d supply, fill #0
  Filled 2022-12-20: qty 30, 5d supply, fill #1

## 2022-11-26 ENCOUNTER — Encounter (HOSPITAL_BASED_OUTPATIENT_CLINIC_OR_DEPARTMENT_OTHER): Payer: Self-pay | Admitting: Physical Therapy

## 2022-11-26 ENCOUNTER — Other Ambulatory Visit: Payer: Self-pay

## 2022-11-26 ENCOUNTER — Ambulatory Visit (HOSPITAL_BASED_OUTPATIENT_CLINIC_OR_DEPARTMENT_OTHER): Payer: 59 | Attending: Orthopedic Surgery | Admitting: Physical Therapy

## 2022-11-26 DIAGNOSIS — M25512 Pain in left shoulder: Secondary | ICD-10-CM | POA: Diagnosis not present

## 2022-11-26 DIAGNOSIS — M25612 Stiffness of left shoulder, not elsewhere classified: Secondary | ICD-10-CM | POA: Insufficient documentation

## 2022-11-26 DIAGNOSIS — M542 Cervicalgia: Secondary | ICD-10-CM | POA: Insufficient documentation

## 2022-11-26 DIAGNOSIS — M6281 Muscle weakness (generalized): Secondary | ICD-10-CM | POA: Insufficient documentation
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
## 2022-11-27 ENCOUNTER — Telehealth (HOSPITAL_BASED_OUTPATIENT_CLINIC_OR_DEPARTMENT_OTHER): Payer: Self-pay | Admitting: Physical Therapy

## 2022-11-27 ENCOUNTER — Other Ambulatory Visit (HOSPITAL_BASED_OUTPATIENT_CLINIC_OR_DEPARTMENT_OTHER): Payer: Self-pay

## 2022-11-27 NOTE — Telephone Encounter (Signed)
Received MyChart message from Patient requesting help with understanding the Wait List. LVM requesting call back to discuss and explain.

## 2022-11-28 ENCOUNTER — Other Ambulatory Visit (HOSPITAL_BASED_OUTPATIENT_CLINIC_OR_DEPARTMENT_OTHER): Payer: Self-pay

## 2022-11-28 MED ORDER — PROGESTERONE MICRONIZED 100 MG PO CAPS
100.0000 mg | ORAL_CAPSULE | Freq: Every day | ORAL | 3 refills | Status: DC
  Filled 2022-11-28 – 2022-12-20 (×2): qty 120, 90d supply, fill #0
  Filled 2023-03-31: qty 120, 90d supply, fill #1
  Filled 2023-06-25: qty 120, 90d supply, fill #2

## 2022-11-29 ENCOUNTER — Encounter (HOSPITAL_BASED_OUTPATIENT_CLINIC_OR_DEPARTMENT_OTHER): Payer: Self-pay | Admitting: Physical Therapy

## 2022-12-05 ENCOUNTER — Encounter (HOSPITAL_BASED_OUTPATIENT_CLINIC_OR_DEPARTMENT_OTHER): Payer: Self-pay | Admitting: Physical Therapy

## 2022-12-05 ENCOUNTER — Ambulatory Visit (HOSPITAL_BASED_OUTPATIENT_CLINIC_OR_DEPARTMENT_OTHER): Payer: 59 | Admitting: Physical Therapy

## 2022-12-05 DIAGNOSIS — M6281 Muscle weakness (generalized): Secondary | ICD-10-CM

## 2022-12-05 DIAGNOSIS — M542 Cervicalgia: Secondary | ICD-10-CM | POA: Diagnosis not present

## 2022-12-05 DIAGNOSIS — M25512 Pain in left shoulder: Secondary | ICD-10-CM | POA: Diagnosis not present

## 2022-12-05 DIAGNOSIS — M25612 Stiffness of left shoulder, not elsewhere classified: Secondary | ICD-10-CM | POA: Diagnosis not present
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
## 2022-12-09 ENCOUNTER — Other Ambulatory Visit (HOSPITAL_BASED_OUTPATIENT_CLINIC_OR_DEPARTMENT_OTHER): Payer: Self-pay

## 2022-12-09 ENCOUNTER — Encounter (HOSPITAL_BASED_OUTPATIENT_CLINIC_OR_DEPARTMENT_OTHER): Payer: Self-pay | Admitting: Physical Therapy

## 2022-12-09 ENCOUNTER — Ambulatory Visit (HOSPITAL_BASED_OUTPATIENT_CLINIC_OR_DEPARTMENT_OTHER): Payer: 59 | Admitting: Physical Therapy

## 2022-12-09 DIAGNOSIS — M25512 Pain in left shoulder: Secondary | ICD-10-CM

## 2022-12-09 DIAGNOSIS — M6281 Muscle weakness (generalized): Secondary | ICD-10-CM

## 2022-12-09 DIAGNOSIS — M542 Cervicalgia: Secondary | ICD-10-CM

## 2022-12-09 DIAGNOSIS — M25612 Stiffness of left shoulder, not elsewhere classified: Secondary | ICD-10-CM
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
## 2022-12-16 ENCOUNTER — Ambulatory Visit (HOSPITAL_BASED_OUTPATIENT_CLINIC_OR_DEPARTMENT_OTHER): Payer: 59 | Admitting: Physical Therapy

## 2022-12-16 ENCOUNTER — Encounter (HOSPITAL_BASED_OUTPATIENT_CLINIC_OR_DEPARTMENT_OTHER): Payer: Self-pay | Admitting: Physical Therapy

## 2022-12-16 DIAGNOSIS — M6281 Muscle weakness (generalized): Secondary | ICD-10-CM | POA: Diagnosis not present

## 2022-12-16 DIAGNOSIS — M25512 Pain in left shoulder: Secondary | ICD-10-CM

## 2022-12-16 DIAGNOSIS — M25612 Stiffness of left shoulder, not elsewhere classified: Secondary | ICD-10-CM | POA: Diagnosis not present

## 2022-12-16 DIAGNOSIS — M542 Cervicalgia: Secondary | ICD-10-CM
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
## 2022-12-19 IMAGING — CT CT ABD-PELV W/ CM
2 of 5 series · 16 of 46 positions shown, 18 images · IV contrast (APPLIED)
Comparison: Radiographs 07/20/2021

CLINICAL DATA: Colonoscopy on 07/31/2021, continued abdominal pain
and bloating.

EXAM:
CT ABDOMEN AND PELVIS WITH CONTRAST
TECHNIQUE: Multidetector CT imaging of the abdomen and pelvis was performed
using the standard protocol following bolus administration of
intravenous contrast.
CONTRAST:  100mL OMNIPAQUE IOHEXOL 300 MG/ML  SOLN

[Series 2: abd pel w · axial · 0.80mm/px · z∈[-464,-89]mm · 13 of 86 slices shown, 15 images]
[im 6/86  soft-tissue]
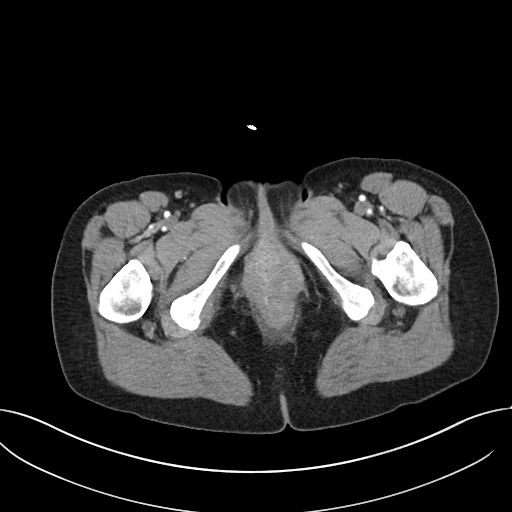
[im 6/86  bone]
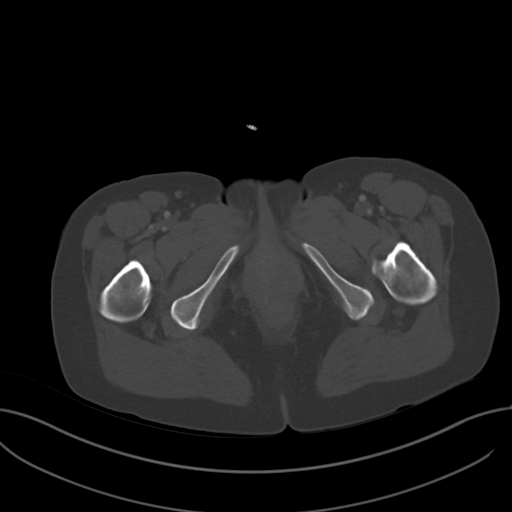
[im 11/86  soft-tissue]
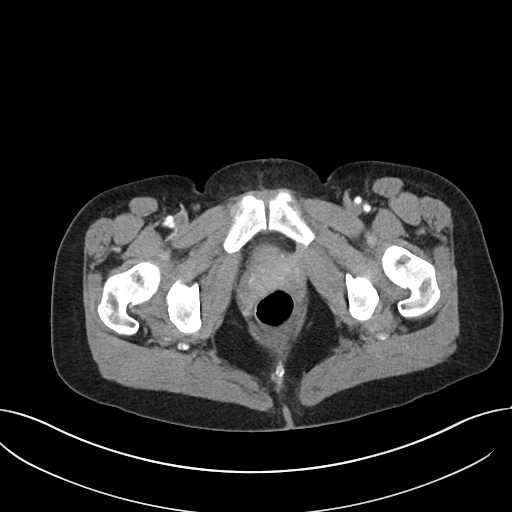
[im 21/86  soft-tissue]
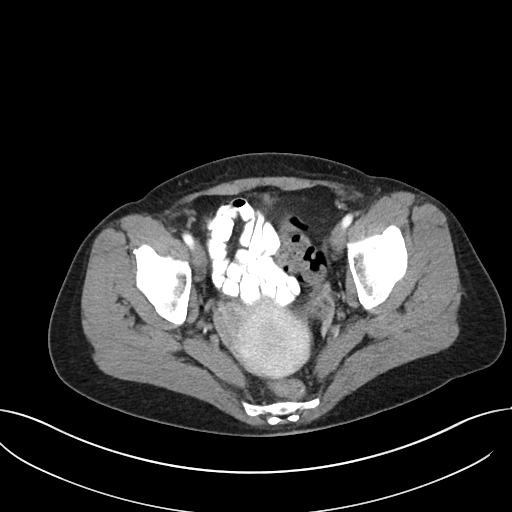
[im 26/86  soft-tissue]
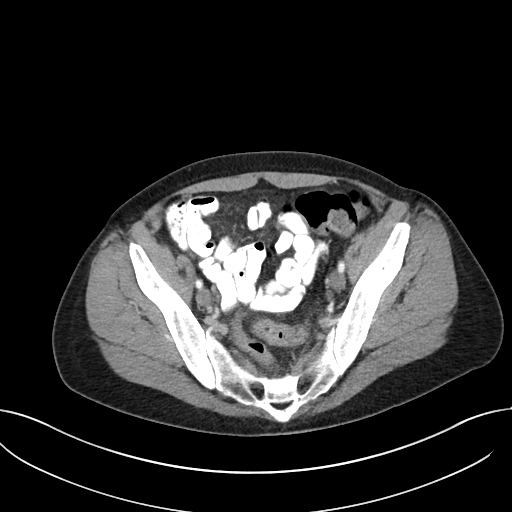
[im 31/86  soft-tissue]
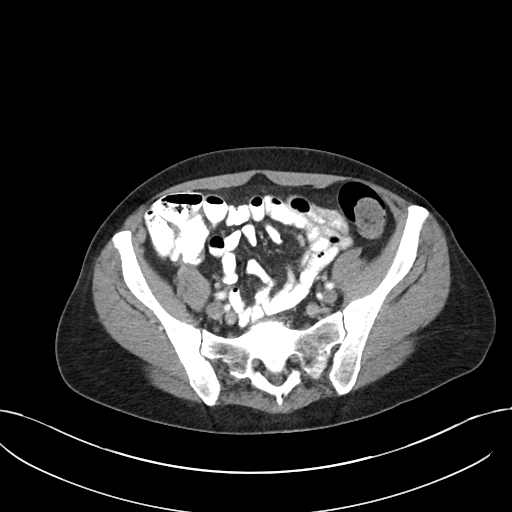
[im 36/86  soft-tissue]
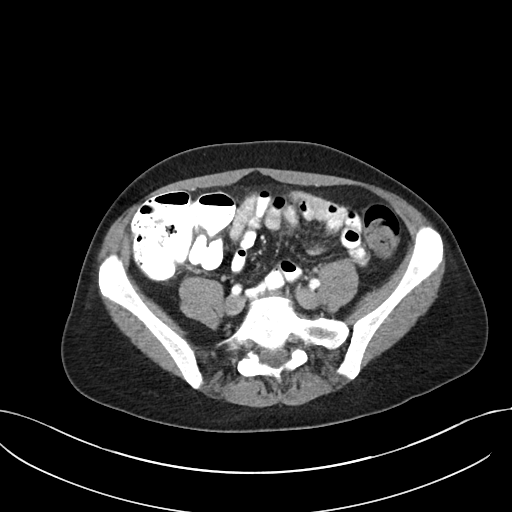
[im 46/86  soft-tissue]
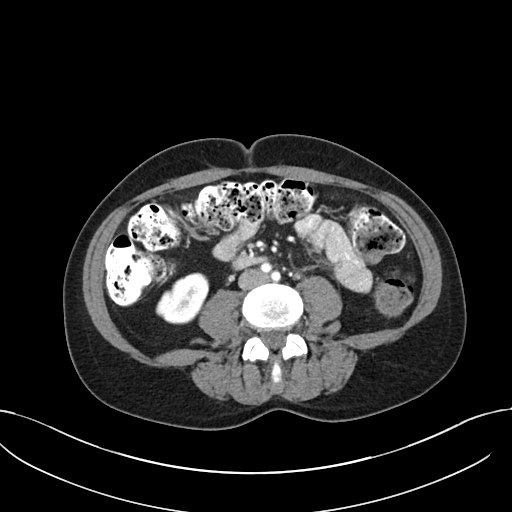
[im 51/86  soft-tissue]
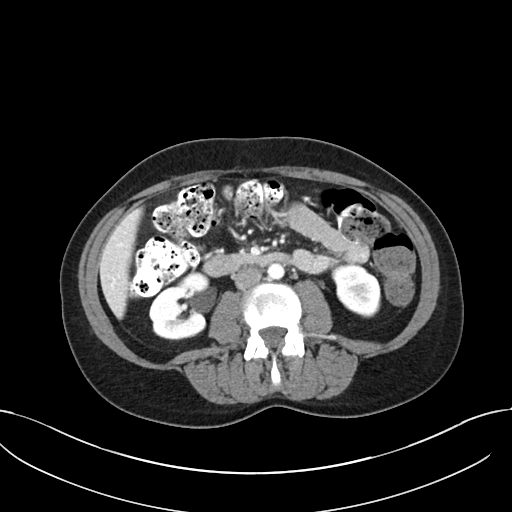
[im 56/86  soft-tissue]
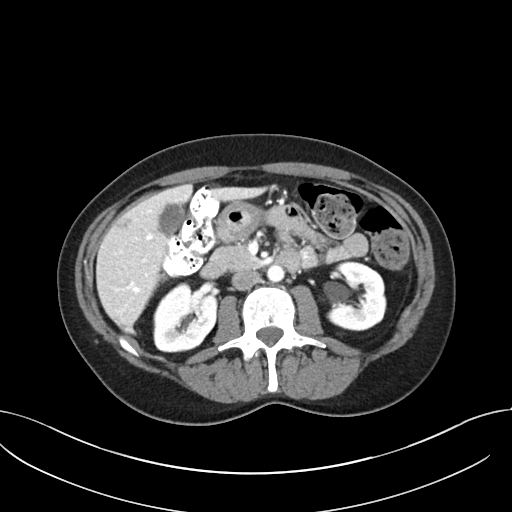
[im 56/86  bone]
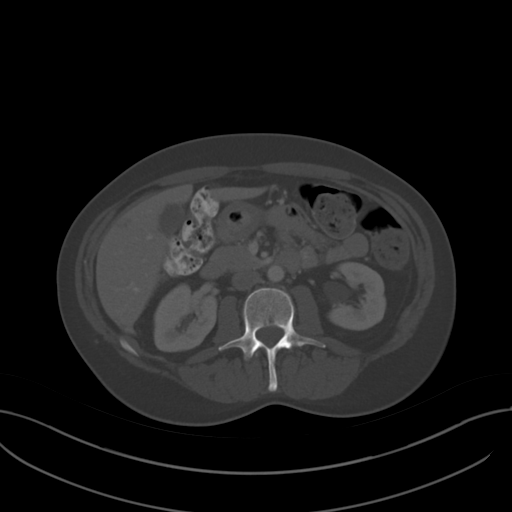
[im 61/86  soft-tissue]
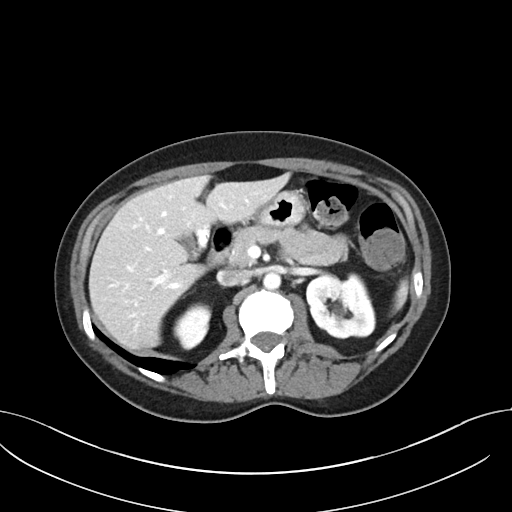
[im 66/86  soft-tissue]
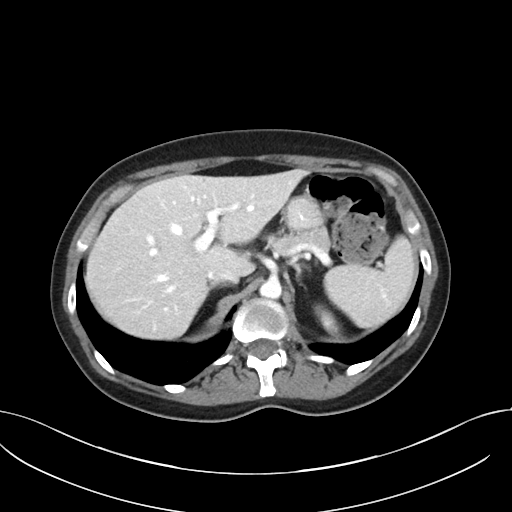
[im 76/86  soft-tissue]
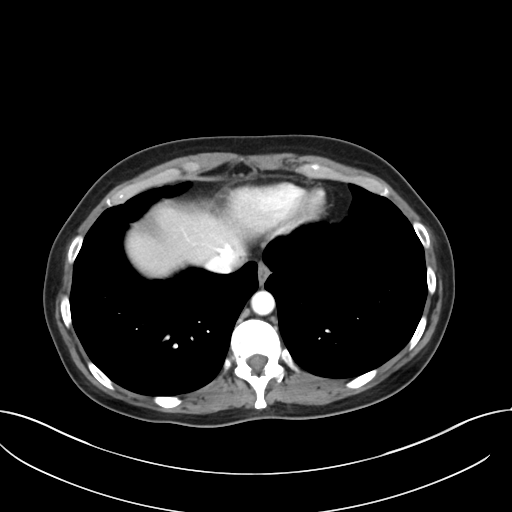
[im 81/86  soft-tissue]
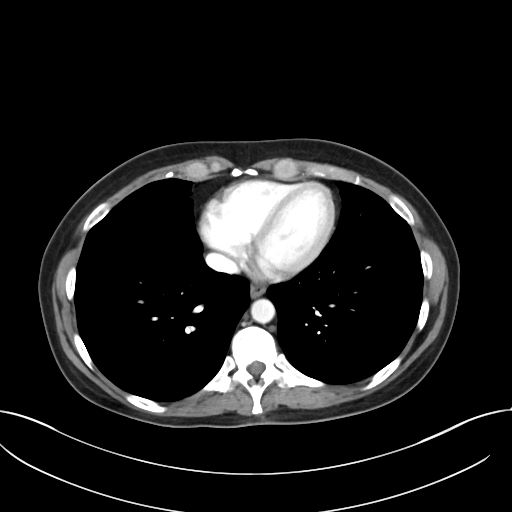

[Series 5: coronal · coronal · 0.76mm/px · 3 of 86 slices shown]
[im 29/86  soft-tissue]
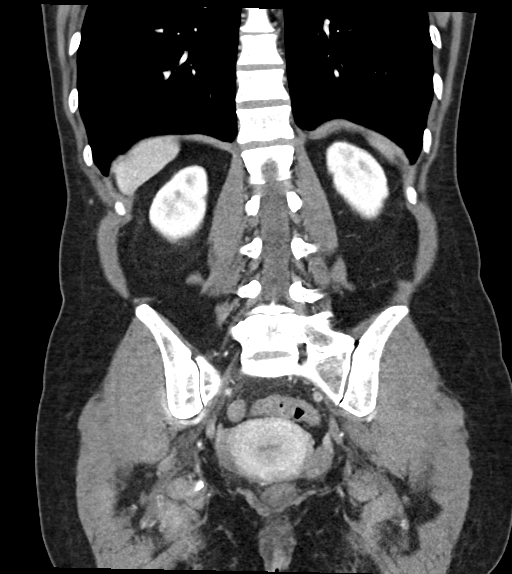
[im 38/86  soft-tissue]
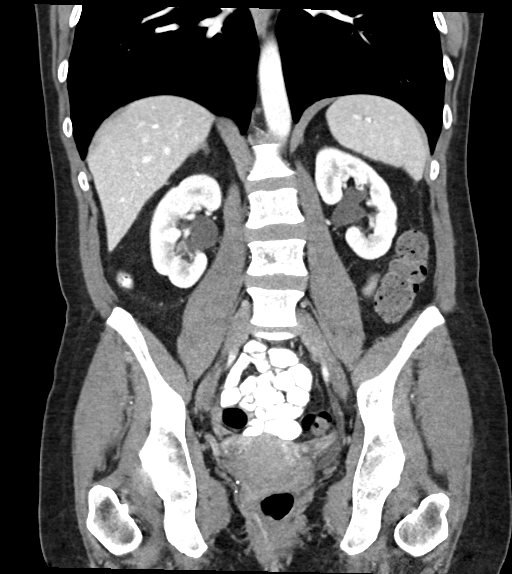
[im 48/86  soft-tissue]
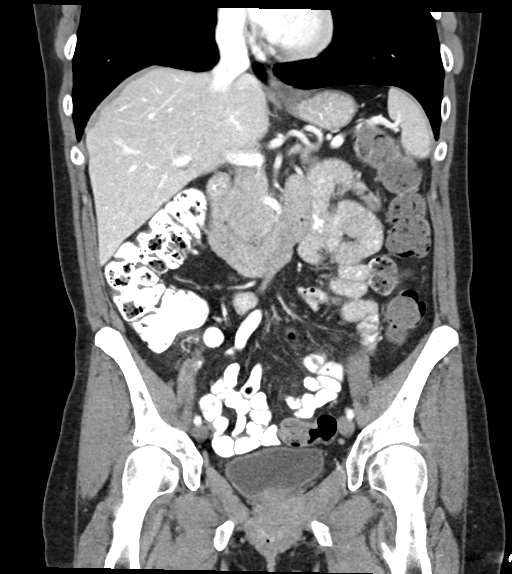

[16 of 46 positions shown; findings below may reference images not displayed]

FINDINGS: Lower chest: Unremarkable

Hepatobiliary: 4 mm focus of subcapsular enhancement anteriorly
along the dome of the liver on image 12 series [DATE] by 0.5 by 0.6 cm hypodense lesion in the right hepatic lobe on
image 40 of series 5, nonspecific but probably a small cyst or
hemangioma.

A 1.4 by 1.0 by 1.4 cm right hepatic lobe lesion on image 32 of
series 2 has peripheral nodular enhancement along with delayed
enhancement favoring hemangioma

Posterior right hepatic lobe subcapsular 0.4 by 1.3 cm hypodense
lesion on image 30 of series 2 demonstrates delayed enhancement
diffusely and is probably a hemangioma.

The gallbladder appears normal.  No biliary dilatation.

Pancreas: Unremarkable

Spleen: Unremarkable

Adrenals/Urinary Tract: Unremarkable

Stomach/Bowel: Prominent stool throughout the colon favors
constipation. No pneumatosis or findings of extraluminal gas or
abscess. No definite colon wall thickening is identified. No small
bowel abnormality is observed. No significant stranding along the
mesocolon.

Vascular/Lymphatic: Unremarkable

Reproductive: Retroverted uterus. A simple appearing left ovarian
cyst measures 2.1 by 2.0 by 2.2 cm compatible with benign cyst. No
follow-up imaging this finding is recommended. Reference: JACR [DATE]):[id number].

Other: No supplemental non-categorized findings.

Musculoskeletal: Transitional L5 vertebra. Mild left foraminal
stenosis at L4-5 due to disc bulge, facet spurring, and
intervertebral spurring.
IMPRESSION: 1. No findings abscess, extraluminal gas, pneumatosis, or other
specific complicating features from colonoscopy.
2.  Prominent stool throughout the colon favors constipation.
3. A total of 4 hypodense hepatic lesions are identified. Two of
these are thought to be hemangiomas based on delayed enhancement,
but 2 of the lesions are too small to characterize. Although highly
likely to be benign, if the patient has abnormal liver enzymes or if
otherwise clinically warranted, these could be further worked up
with hepatic protocol MRI with and without contrast
4. Mild left foraminal stenosis at L4-5.

## 2022-12-20 ENCOUNTER — Other Ambulatory Visit (HOSPITAL_BASED_OUTPATIENT_CLINIC_OR_DEPARTMENT_OTHER): Payer: Self-pay

## 2022-12-23 ENCOUNTER — Ambulatory Visit (HOSPITAL_BASED_OUTPATIENT_CLINIC_OR_DEPARTMENT_OTHER): Payer: 59 | Attending: Orthopedic Surgery | Admitting: Physical Therapy

## 2022-12-23 ENCOUNTER — Encounter (HOSPITAL_BASED_OUTPATIENT_CLINIC_OR_DEPARTMENT_OTHER): Payer: Self-pay | Admitting: Physical Therapy

## 2022-12-23 DIAGNOSIS — M25512 Pain in left shoulder: Secondary | ICD-10-CM | POA: Diagnosis not present

## 2022-12-23 DIAGNOSIS — M542 Cervicalgia: Secondary | ICD-10-CM | POA: Diagnosis not present

## 2022-12-23 DIAGNOSIS — M6281 Muscle weakness (generalized): Secondary | ICD-10-CM | POA: Insufficient documentation

## 2022-12-23 DIAGNOSIS — M25612 Stiffness of left shoulder, not elsewhere classified: Secondary | ICD-10-CM | POA: Insufficient documentation
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
## 2022-12-30 ENCOUNTER — Encounter (HOSPITAL_BASED_OUTPATIENT_CLINIC_OR_DEPARTMENT_OTHER): Payer: Self-pay | Admitting: Physical Therapy

## 2022-12-30 ENCOUNTER — Ambulatory Visit (HOSPITAL_BASED_OUTPATIENT_CLINIC_OR_DEPARTMENT_OTHER): Payer: 59 | Admitting: Physical Therapy

## 2022-12-30 DIAGNOSIS — M25512 Pain in left shoulder: Secondary | ICD-10-CM | POA: Diagnosis not present

## 2022-12-30 DIAGNOSIS — M542 Cervicalgia: Secondary | ICD-10-CM | POA: Diagnosis not present

## 2022-12-30 DIAGNOSIS — M6281 Muscle weakness (generalized): Secondary | ICD-10-CM

## 2022-12-30 DIAGNOSIS — M25612 Stiffness of left shoulder, not elsewhere classified: Secondary | ICD-10-CM
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
## 2023-01-02 ENCOUNTER — Encounter (HOSPITAL_BASED_OUTPATIENT_CLINIC_OR_DEPARTMENT_OTHER): Payer: 59 | Admitting: Physical Therapy

## 2023-01-06 ENCOUNTER — Ambulatory Visit (HOSPITAL_BASED_OUTPATIENT_CLINIC_OR_DEPARTMENT_OTHER): Payer: 59 | Admitting: Physical Therapy

## 2023-01-09 ENCOUNTER — Encounter (HOSPITAL_BASED_OUTPATIENT_CLINIC_OR_DEPARTMENT_OTHER): Payer: 59 | Admitting: Physical Therapy

## 2023-01-13 ENCOUNTER — Encounter (INDEPENDENT_AMBULATORY_CARE_PROVIDER_SITE_OTHER): Payer: 59 | Admitting: Neurology

## 2023-01-13 ENCOUNTER — Encounter (HOSPITAL_BASED_OUTPATIENT_CLINIC_OR_DEPARTMENT_OTHER): Payer: 59 | Admitting: Physical Therapy

## 2023-01-13 DIAGNOSIS — G500 Trigeminal neuralgia: Secondary | ICD-10-CM

## 2023-01-13 NOTE — Telephone Encounter (Signed)
  Thank you for your message seeking medical advice.* My assessment and recommendation are as follows:  Hi Michele [name], I reviewed your chart and I don;t believe we have never evaluated you for memory or for snoring. Unfortunately you need to see your primary care in an appointment (not just a referral) for anything that is new. Memory loss is not always neurologic, it could be medical (like thyroid) or depression or many other medications.. You have to see your primary care in an office visit and we need a referral (if needed she may find the cause) after you have had a medical examination.  Your primary care could evaluat you for snoring and refer you to a sleep doctor. Your appointment in April is a follow up for you headaches and not a new appointent for anything else. Please make an appointment with your primary care for evaluation and new refrrals to [place] if needed per her clinical judgement thanks dr Jaynee Eagles  Sincerely,  Melvenia Beam, MD    *This exchange required the expertise of a doctor, nurse practitioner, physician assistant, optometrist or certified nurse midwife and qualifies as a Medical Advice Message, please visit [email or web address] for more details. [place] will bill your insurance on your behalf; copays and deductibles may apply. Questions? Reply to this message.

## 2023-01-14 ENCOUNTER — Ambulatory Visit (HOSPITAL_BASED_OUTPATIENT_CLINIC_OR_DEPARTMENT_OTHER): Payer: 59 | Admitting: Physical Therapy

## 2023-01-14 ENCOUNTER — Encounter (HOSPITAL_BASED_OUTPATIENT_CLINIC_OR_DEPARTMENT_OTHER): Payer: Self-pay | Admitting: Physical Therapy

## 2023-01-14 DIAGNOSIS — M25512 Pain in left shoulder: Secondary | ICD-10-CM

## 2023-01-14 DIAGNOSIS — M6281 Muscle weakness (generalized): Secondary | ICD-10-CM | POA: Diagnosis not present

## 2023-01-14 DIAGNOSIS — M25612 Stiffness of left shoulder, not elsewhere classified: Secondary | ICD-10-CM

## 2023-01-14 DIAGNOSIS — M542 Cervicalgia: Secondary | ICD-10-CM
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
## 2023-01-14 NOTE — Progress Notes (Signed)
Michele [name] [facility] Phone: [phone] Subjective:   Michele [name], am serving as a scribe for Dr. Hulan Saas.  I'm seeing this patient by the request  of:  [name], [name], DO  CC: back and neck pain   [id number]  Michele [name] is a 57 y.o. female coming in with complaint of back and neck pain. OMT on 09/23/2022. Shoulder surgery 11/12/2022. Patient states that he shoulder pain has improved and an increase in ROM. Currently doing PT for her shoulder. L sided neck and lower back pain have increased. Weaning off of gabapentin due to memory issues from being on it for past 2 years. Lumbar spine pain has increased since decreasing dosage. Would like to know where her pain is coming from.   Medications patient has been prescribed:   Taking:         Reviewed prior external information including notes and imaging from previsou exam, outside providers and external EMR if available.   As well as notes that were available from care everywhere and other healthcare systems.  Past medical history, social, surgical and family history all reviewed in electronic medical record.  [name] pertanent information unless stated regarding to the chief complaint.   Past Medical History:  Diagnosis Date   Abdominal bloating    Abnormal cervical Papanicolaou smear 08/17/2020   Anemia    Anxiety    Arthritis    Chicken pox    Chronic lacrimal canaliculitis [id number]   Chronic pain 11/20/2021   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   DDD (degenerative disc disease), cervical    Depression    Dorsalgia    Glaucoma    Hypercholesterolemia    Intestinal gas excretion    Memory loss 01/13/2020   Migraine 08/17/2020   Nonallopathic lesion of cervical region 01/23/2021   l areas are chronic   Patient tolerated the procedure well with improvement in symptoms  Patient given exercises, stretches and lifestyle modifications  See medications in  patient instructions if given  Patient will follow up in 4-8 weeks   Peptic ulcer    Scapular dyskinesis 01/23/2021   Scarlet fever    Sequelae of other specified infectious and parasitic diseases 11/20/2021   Skin cancer    Tick bite 02/22/2018   UTI (urinary tract infection)    Yeast infection     Allergies  Allergen Reactions   Lipitor [Atorvastatin Calcium] Other (See Comments)    Memory issue; myalgia   Celebrex [Celecoxib] Rash     Review of Systems:  [name] headache, visual changes, nausea, vomiting, diarrhea, constipation, dizziness, abdominal pain, skin rash, fevers, chills, night sweats, weight loss, swollen lymph nodes, body aches, joint swelling, chest pain, shortness of breath, mood changes. POSITIVE muscle aches  Objective  Blood pressure 110/82, pulse 68, height 5\' 2"  (1.575 m), weight 145 lb (65.8 kg), last menstrual period 11/29/2021, SpO2 98 %.   General: [name] apparent distress alert and oriented x3 mood and affect normal, dressed appropriately.  HEENT: Pupils equal, extraocular movements intact  Respiratory: Patient's speak in full sentences and does not appear short of breath  Cardiovascular: [name] lower extremity edema, non tender, [name] erythema  MSK:  Loss of lordosis of neck, difficulty with left rotation  Low back does have tightness noted as well.  Patient does have tightness with Corky Sox right greater than left today.  Osteopathic findings C2 flexed rotated and side bent right C5 flexed rotated and side bent  left T3 extended rotated and side bent right inhaled rib T9 extended rotated and side bent left T11 flexed rotated and side bent left L1 flexed rotated and side bent right Sacrum right on right    Assessment and Plan:  Cervical disc disorder with radiculopathy of cervical region History of degenerative disc with some spurring at the C5-C6 area.  Discussed with patient how important it is to work on Engineer, building services.  Attempted osteopathic  manipulation with relatively good resolution of some pain.  Discussed icing regimen.  Will clinical anti-inflammatories.  Follow-up again in 6 to 8 weeks discussed different medications.  Will start Cymbalta and titrate off of the gabapentin as well as the Prozac.  Follow-up again in 6 to 8 weeks    Nonallopathic problems  Decision today to treat with OMT was based on Physical Exam  After verbal consent patient was treated with HVLA, ME, FPR techniques in cervical, rib, thoracic, lumbar, and sacral  areas  Patient tolerated the procedure well with improvement in symptoms  Patient given exercises, stretches and lifestyle modifications  See medications in patient instructions if given  Patient will follow up in 4-8 weeks     The above documentation has been reviewed and is accurate and complete Lyndal Pulley, DO         Note: This dictation was prepared with Dragon dictation along with smaller phrase technology. Any transcriptional errors that result from this process are unintentional.

## 2023-01-15 ENCOUNTER — Encounter (HOSPITAL_BASED_OUTPATIENT_CLINIC_OR_DEPARTMENT_OTHER): Payer: Self-pay | Admitting: Physical Therapy

## 2023-01-16 ENCOUNTER — Encounter (HOSPITAL_BASED_OUTPATIENT_CLINIC_OR_DEPARTMENT_OTHER): Payer: 59 | Admitting: Physical Therapy

## 2023-01-20 ENCOUNTER — Encounter (HOSPITAL_BASED_OUTPATIENT_CLINIC_OR_DEPARTMENT_OTHER): Payer: 59 | Admitting: Physical Therapy

## 2023-01-21 ENCOUNTER — Encounter: Payer: Self-pay | Admitting: Family Medicine

## 2023-01-21 ENCOUNTER — Other Ambulatory Visit (HOSPITAL_BASED_OUTPATIENT_CLINIC_OR_DEPARTMENT_OTHER): Payer: Self-pay

## 2023-01-21 ENCOUNTER — Ambulatory Visit (INDEPENDENT_AMBULATORY_CARE_PROVIDER_SITE_OTHER): Payer: 59 | Admitting: Family Medicine

## 2023-01-21 VITALS — BP 110/82 | HR 68 | Ht 62.0 in | Wt 145.0 lb

## 2023-01-21 DIAGNOSIS — M9901 Segmental and somatic dysfunction of cervical region: Secondary | ICD-10-CM | POA: Diagnosis not present

## 2023-01-21 DIAGNOSIS — M9904 Segmental and somatic dysfunction of sacral region: Secondary | ICD-10-CM | POA: Diagnosis not present

## 2023-01-21 DIAGNOSIS — M9908 Segmental and somatic dysfunction of rib cage: Secondary | ICD-10-CM

## 2023-01-21 DIAGNOSIS — M5010 Cervical disc disorder with radiculopathy, unspecified cervical region: Secondary | ICD-10-CM | POA: Diagnosis not present

## 2023-01-21 DIAGNOSIS — M9903 Segmental and somatic dysfunction of lumbar region: Secondary | ICD-10-CM

## 2023-01-21 DIAGNOSIS — M9902 Segmental and somatic dysfunction of thoracic region: Secondary | ICD-10-CM

## 2023-01-21 MED ORDER — DULOXETINE HCL 20 MG PO CPEP
20.0000 mg | ORAL_CAPSULE | Freq: Every day | ORAL | 2 refills | Status: DC
  Filled 2023-01-21: qty 30, 30d supply, fill #0
  Filled 2023-03-11: qty 30, 30d supply, fill #1

## 2023-01-21 NOTE — Assessment & Plan Note (Signed)
History of degenerative disc with some spurring at the C5-C6 area.  Discussed with patient how important it is to work on Engineer, building services.  Attempted osteopathic manipulation with relatively good resolution of some pain.  Discussed icing regimen.  Will clinical anti-inflammatories.  Follow-up again in 6 to 8 weeks discussed different medications.  Will start Cymbalta and titrate off of the gabapentin as well as the Prozac.  Follow-up again in 6 to 8 weeks

## 2023-01-21 NOTE — Patient Instructions (Signed)
LBP exercises Cymbalta 20mg   Decrease prozac to 10mg  then discontinue after 1 week or start titration of gabapentin See me again in 6-8 weeks

## 2023-01-22 ENCOUNTER — Encounter (HOSPITAL_BASED_OUTPATIENT_CLINIC_OR_DEPARTMENT_OTHER): Payer: 59 | Admitting: Physical Therapy

## 2023-01-23 ENCOUNTER — Ambulatory Visit: Payer: Self-pay | Admitting: Adult Health

## 2023-01-26 ENCOUNTER — Encounter: Payer: Self-pay | Admitting: Family Medicine

## 2023-01-27 ENCOUNTER — Ambulatory Visit (INDEPENDENT_AMBULATORY_CARE_PROVIDER_SITE_OTHER): Payer: 59 | Admitting: Family Medicine

## 2023-01-27 ENCOUNTER — Ambulatory Visit: Payer: 59 | Admitting: Family Medicine

## 2023-01-27 ENCOUNTER — Encounter (HOSPITAL_BASED_OUTPATIENT_CLINIC_OR_DEPARTMENT_OTHER): Payer: Self-pay | Admitting: Physical Therapy

## 2023-01-27 ENCOUNTER — Ambulatory Visit (HOSPITAL_BASED_OUTPATIENT_CLINIC_OR_DEPARTMENT_OTHER): Payer: 59 | Attending: Orthopedic Surgery | Admitting: Physical Therapy

## 2023-01-27 VITALS — BP 91/61 | HR 65 | Temp 98.1°F | Wt 142.2 lb

## 2023-01-27 DIAGNOSIS — M542 Cervicalgia: Secondary | ICD-10-CM

## 2023-01-27 DIAGNOSIS — M25612 Stiffness of left shoulder, not elsewhere classified: Secondary | ICD-10-CM

## 2023-01-27 DIAGNOSIS — M25512 Pain in left shoulder: Secondary | ICD-10-CM

## 2023-01-27 DIAGNOSIS — R0681 Apnea, not elsewhere classified: Secondary | ICD-10-CM

## 2023-01-27 DIAGNOSIS — F5101 Primary insomnia: Secondary | ICD-10-CM

## 2023-01-27 DIAGNOSIS — R5383 Other fatigue: Secondary | ICD-10-CM

## 2023-01-27 DIAGNOSIS — M6281 Muscle weakness (generalized): Secondary | ICD-10-CM

## 2023-01-27 DIAGNOSIS — R400 Somnolence: Secondary | ICD-10-CM

## 2023-01-27 LAB — IBC + FERRITIN
Ferritin: 5.5 ng/mL — ABNORMAL LOW (ref 10.0–291.0)
Iron: 43 ug/dL (ref 42–145)
Saturation Ratios: 13.1 % — ABNORMAL LOW (ref 20.0–50.0)
TIBC: 327.6 ug/dL (ref 250.0–450.0)
Transferrin: 234 mg/dL (ref 212.0–360.0)

## 2023-01-27 LAB — CBC WITH DIFFERENTIAL/PLATELET
Basophils Absolute: 0.2 10*3/uL — ABNORMAL HIGH (ref 0.0–0.1)
Basophils Relative: 3.1 % — ABNORMAL HIGH (ref 0.0–3.0)
Eosinophils Absolute: 0.1 10*3/uL (ref 0.0–0.7)
Eosinophils Relative: 1 % (ref 0.0–5.0)
HCT: 38.3 % (ref 36.0–46.0)
Hemoglobin: 12.8 g/dL (ref 12.0–15.0)
Lymphocytes Relative: 38.3 % (ref 12.0–46.0)
Lymphs Abs: 2 10*3/uL (ref 0.7–4.0)
MCHC: 33.4 g/dL (ref 30.0–36.0)
MCV: 93.8 fl (ref 78.0–100.0)
Monocytes Absolute: 0.4 10*3/uL (ref 0.1–1.0)
Monocytes Relative: 7.4 % (ref 3.0–12.0)
Neutro Abs: 2.7 10*3/uL (ref 1.4–7.7)
Neutrophils Relative %: 50.2 % (ref 43.0–77.0)
Platelets: 287 10*3/uL (ref 150.0–400.0)
RBC: 4.08 Mil/uL (ref 3.87–5.11)
RDW: 15.3 % (ref 11.5–15.5)
WBC: 5.3 10*3/uL (ref 4.0–10.5)

## 2023-01-27 LAB — COMPREHENSIVE METABOLIC PANEL
ALT: 7 U/L (ref 0–35)
AST: 14 U/L (ref 0–37)
Albumin: 3.9 g/dL (ref 3.5–5.2)
Alkaline Phosphatase: 48 U/L (ref 39–117)
BUN: 9 mg/dL (ref 6–23)
CO2: 26 mEq/L (ref 19–32)
Calcium: 9 mg/dL (ref 8.4–10.5)
Chloride: 108 mEq/L (ref 96–112)
Creatinine, Ser: 0.87 mg/dL (ref 0.40–1.20)
GFR: 73.3 mL/min (ref >60.00)
Glucose, Bld: 86 mg/dL (ref 70–99)
Potassium: 3.9 mEq/L (ref 3.5–5.1)
Sodium: 138 mEq/L (ref 135–145)
Total Bilirubin: 0.3 mg/dL (ref 0.2–1.2)
Total Protein: 6.1 g/dL (ref 6.0–8.3)

## 2023-01-27 LAB — VITAMIN B12: Vitamin B-12: 416 pg/mL (ref 211–911)

## 2023-01-27 LAB — TSH: TSH: 0.83 u[IU]/mL (ref 0.35–5.50)

## 2023-01-27 LAB — VITAMIN D 25 HYDROXY (VIT D DEFICIENCY, FRACTURES): VITD: 63.66 ng/mL (ref 30.00–100.00)
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
## 2023-01-27 NOTE — Patient Instructions (Signed)
Fatigue If you have fatigue, you feel tired all the time and have a lack of energy or a lack of motivation. Fatigue may make it difficult to start or complete tasks because of exhaustion. Occasional or mild fatigue is often a normal response to activity or life. However, long-term (chronic) or extreme fatigue may be a symptom of a medical condition such as: Depression. Not having enough red blood cells or hemoglobin in the blood (anemia). A problem with a small gland located in the lower front part of the neck (thyroid disorder). Rheumatologic conditions. These are problems related to the body's defense system (immune system). Infections, especially certain viral infections. Fatigue can also lead to negative health outcomes over time. Follow these instructions at home: Medicines Take over-the-counter and prescription medicines only as told by your health care provider. Take a multivitamin if told by your health care provider. Do not use herbal or dietary supplements unless they are approved by your health care provider. Eating and drinking  Avoid heavy meals in the evening. Eat a well-balanced diet, which includes lean proteins, whole grains, plenty of fruits and vegetables, and low-fat dairy products. Avoid eating or drinking too many products with caffeine in them. Avoid alcohol. Drink enough fluid to keep your urine pale yellow. Activity  Exercise regularly, as told by your health care provider. Use or practice techniques to help you relax, such as yoga, tai chi, meditation, or massage therapy. Lifestyle Change situations that cause you stress. Try to keep your work and personal schedules in balance. Do not use recreational or illegal drugs. General instructions Monitor your fatigue for any changes. Go to bed and get up at the same time every day. Avoid fatigue by pacing yourself during the day and getting enough sleep at night. Maintain a healthy weight. Contact a health care  provider if: Your fatigue does not get better. You have a fever. You suddenly lose or gain weight. You have headaches. You have trouble falling asleep or sleeping through the night. You feel angry, guilty, anxious, or sad. You have swelling in your legs or another part of your body. Get help right away if: You feel confused, feel like you might faint, or faint. Your vision is blurry or you have a severe headache. You have severe pain in your abdomen, your back, or the area between your waist and hips (pelvis). You have chest pain, shortness of breath, or an irregular or fast heartbeat. You are unable to urinate, or you urinate less than normal. You have abnormal bleeding from the rectum, nose, lungs, nipples, or, if you are female, the vagina. You vomit blood. You have thoughts about hurting yourself or others. These symptoms may be an emergency. Get help right away. Call 911. Do not wait to see if the symptoms will go away. Do not drive yourself to the hospital. Get help right away if you feel like you may hurt yourself or others, or have thoughts about taking your own life. Go to your nearest emergency room or: Call 911. Call the National Suicide Prevention Lifeline at 1-800-273-8255 or 988. This is open 24 hours a day. Text the Crisis Text Line at 741741. Summary If you have fatigue, you feel tired all the time and have a lack of energy or a lack of motivation. Fatigue may make it difficult to start or complete tasks because of exhaustion. Long-term (chronic) or extreme fatigue may be a symptom of a medical condition. Exercise regularly, as told by your health care provider.   Change situations that cause you stress. Try to keep your work and personal schedules in balance. This information is not intended to replace advice given to you by your health care provider. Make sure you discuss any questions you have with your health care provider. Document Revised: 07/30/2021 Document  Reviewed: 07/30/2021 Elsevier Patient Education  2023 Elsevier Inc.  
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
## 2023-01-28 ENCOUNTER — Other Ambulatory Visit (HOSPITAL_BASED_OUTPATIENT_CLINIC_OR_DEPARTMENT_OTHER): Payer: Self-pay

## 2023-01-28 ENCOUNTER — Telehealth: Payer: Self-pay | Admitting: Family Medicine

## 2023-01-28 MED ORDER — POLYSACCHARIDE IRON COMPLEX 434.8 (200 FE) MG PO CAPS
1.0000 | ORAL_CAPSULE | Freq: Every day | ORAL | 5 refills | Status: DC
  Filled 2023-01-28 – 2023-02-04 (×3): qty 30, 30d supply, fill #0
  Filled 2023-03-31: qty 30, 30d supply, fill #1

## 2023-01-28 NOTE — Telephone Encounter (Signed)
Please call patient Michele [name] liver, kidney and thyroid functions are normal Michele [name] vitamin D levels are excellent at 63.    Iron levels are low at 43, with a ferritin very low at 5.5 and Michele [name] saturations are low at 13.  I have called in an iron supplement for Michele [name] to take once daily.  I would encourage Michele [name] to take a stool softener, such as Senokot nightly, while taking iron supplementation so that she does not experience constipation.  Michele [name] B12 is on the low end of normal at 416.  She could benefit from increasing B12 supplementation starting OTC B12 1000 mcg sublingual daily.   Michele [name] blood cell counts are normal with the exception of just mildly elevated basophil cells.  This can be indicative of a inflammatory reaction or allergen.  I would like to follow up with Michele [name] on this condition and do further testing. Follow-up within the next 2 weeks at Michele [name] earliest convenience.

## 2023-01-29 ENCOUNTER — Other Ambulatory Visit (HOSPITAL_BASED_OUTPATIENT_CLINIC_OR_DEPARTMENT_OTHER): Payer: Self-pay

## 2023-01-30 ENCOUNTER — Encounter (HOSPITAL_BASED_OUTPATIENT_CLINIC_OR_DEPARTMENT_OTHER): Payer: 59 | Admitting: Physical Therapy

## 2023-02-03 ENCOUNTER — Encounter (HOSPITAL_BASED_OUTPATIENT_CLINIC_OR_DEPARTMENT_OTHER): Payer: 59 | Admitting: Physical Therapy

## 2023-02-04 ENCOUNTER — Other Ambulatory Visit (HOSPITAL_BASED_OUTPATIENT_CLINIC_OR_DEPARTMENT_OTHER): Payer: Self-pay

## 2023-02-04 MED ORDER — HYDROXYZINE HCL 10 MG PO TABS
10.0000 mg | ORAL_TABLET | Freq: Three times a day (TID) | ORAL | 1 refills | Status: DC | PRN
  Filled 2023-02-04: qty 30, 5d supply, fill #0
  Filled 2023-03-11: qty 30, 5d supply, fill #1

## 2023-02-05 ENCOUNTER — Other Ambulatory Visit (HOSPITAL_BASED_OUTPATIENT_CLINIC_OR_DEPARTMENT_OTHER): Payer: Self-pay

## 2023-02-06 ENCOUNTER — Encounter (HOSPITAL_BASED_OUTPATIENT_CLINIC_OR_DEPARTMENT_OTHER): Payer: 59 | Admitting: Physical Therapy

## 2023-02-10 ENCOUNTER — Encounter (HOSPITAL_BASED_OUTPATIENT_CLINIC_OR_DEPARTMENT_OTHER): Payer: 59 | Admitting: Physical Therapy

## 2023-02-13 ENCOUNTER — Encounter (HOSPITAL_BASED_OUTPATIENT_CLINIC_OR_DEPARTMENT_OTHER): Payer: 59 | Admitting: Physical Therapy

## 2023-02-24 ENCOUNTER — Encounter: Payer: Self-pay | Admitting: Neurology

## 2023-02-24 ENCOUNTER — Ambulatory Visit (INDEPENDENT_AMBULATORY_CARE_PROVIDER_SITE_OTHER): Payer: 59 | Admitting: Neurology

## 2023-02-24 VITALS — BP 111/70 | HR 72 | Ht 62.0 in | Wt 137.4 lb

## 2023-02-24 DIAGNOSIS — R0681 Apnea, not elsewhere classified: Secondary | ICD-10-CM | POA: Diagnosis not present

## 2023-02-24 DIAGNOSIS — R519 Headache, unspecified: Secondary | ICD-10-CM

## 2023-02-24 DIAGNOSIS — Z820 Family history of epilepsy and other diseases of the nervous system: Secondary | ICD-10-CM | POA: Diagnosis not present

## 2023-02-24 DIAGNOSIS — R0683 Snoring: Secondary | ICD-10-CM | POA: Diagnosis not present

## 2023-02-24 DIAGNOSIS — G4719 Other hypersomnia: Secondary | ICD-10-CM | POA: Diagnosis not present

## 2023-02-24 DIAGNOSIS — R351 Nocturia: Secondary | ICD-10-CM

## 2023-02-24 NOTE — Patient Instructions (Signed)

## 2023-02-24 NOTE — Progress Notes (Signed)
Subjective:    Patient ID: Michele [name] is a 57 y.o. female.  HPI     Michele Foley, MD, PhD [facility] [address] [address] [place], [place] [address]  Dear Dr. Claiborne Billings,  I saw your patient, Michele [name], on your kind request sleep clinic today for initial consultation of a sleep disorder, in particular, concern for underlying obstructive sleep apnea.  The patient is unaccompanied today.  As you know, Michele [name] is a 57 year old female with an underlying medical history of arthritis, status post rotator cuff repair on the L, glaucoma, hyperlipidemia, migraine headaches, anemia, anxiety, depression, and borderline overweight state, who reports worsening daytime somnolence for the past 9+ months.  She does snore, her husband has also noticed occasional snorting sounds or gasping sounds and pauses in her breathing while asleep.  She lives with her husband, they have 2 dogs in the household.  Both dogs sleep in the bedroom, one of them sleeps on the bed with them.  She does not have a TV in her bedroom.  She has trouble going to sleep and has been on hydroxyzine 10 mg strength at night.  She takes Flexeril as needed, she was taken off of Prozac and gabapentin recently and started on Cymbalta which caused her insomnia.  She is switching to daytime from the Cymbalta did not help her energy level.  She has occasional morning headaches.  She has nocturia about once or twice per average night.  Her Epworth sleepiness score is 8 out of 24, fatigue severity score is 32 out of 63.  I reviewed your office note from 01/27/2023.  She had blood work at the time including TSH, B12, vitamin D, iron studies and CMP as well as CBC.  I reviewed the test results.  Her ferritin was quite low at 5.5.  Otherwise other test results were benign.  She works as a Engineer, civil (consulting) in an outpatient setting, in [employer].  Bedtime is generally around 9 PM and rise time between 5:30 AM and 6:30 AM.  She drinks  limited caffeine in the form of coffee, 1 cup in the morning, she drinks alcohol in the form of wine, 1 glass daily, 3-4 times a week.  She is a non-smoker.  She has been on iron supplementation.  She takes it about 3 times a week as it makes her constipated.  She also takes her Cymbalta every second or third day. Her dad has sleep apnea.  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   DDD (degenerative disc disease), cervical    Depression    Dorsalgia    Glaucoma    Hypercholesterolemia    Intestinal gas excretion    Memory loss 01/13/2020   Migraine 08/17/2020   Nonallopathic lesion of cervical region 01/23/2021   l areas are chronic   Patient tolerated the procedure well with improvement in symptoms  Patient given exercises, stretches and lifestyle modifications  See medications in patient instructions if given  Patient will follow up in 4-8 weeks   Peptic ulcer    Scapular dyskinesis 01/23/2021   Scarlet fever    Sequelae of other specified infectious and parasitic diseases 11/20/2021  Skin cancer    Tick bite 02/22/2018   UTI (urinary tract infection)    Yeast infection     Her Past Surgical History Is Significant For: Past Surgical History:  Procedure Laterality Date   BASAL CELL CARCINOMA EXCISION     BREAST BIOPSY Left 1992   Duct removal (No scar seen)    BREAST EXCISIONAL BIOPSY Left 1992   COLONOSCOPY  07/2021   DACRORHINOCYSTOTOMY  10/2020   LEFT HEART CATH AND CORONARY ANGIOGRAPHY N/A 01/16/2018   Procedure: LEFT HEART CATH AND CORONARY ANGIOGRAPHY;  Surgeon: Yvonne Kendall, MD;  Location: [facility];  Service: Cardiovascular;  Laterality: N/A;   SHOULDER ARTHROSCOPY WITH ROTATOR CUFF  REPAIR AND SUBACROMIAL DECOMPRESSION Left 11/12/2022   Procedure: SHOULDER ARTHROSCOPY WITH ROTATOR CUFF DEBBRIDEMENT AND SUBACROMIAL DECOMPRESSION;  Surgeon: Jones Broom, MD;  Location: [facility];  Service: Orthopedics;  Laterality: Left;   UPPER GASTROINTESTINAL ENDOSCOPY  07/2021    Her Family History Is Significant For: Family History  Problem Relation Age of Onset   Hypertension Mother    Diabetes Mother    Diverticulitis Mother    Heart failure Mother    Hyperlipidemia Father    Diabetes Father    Heart attack Father    Arrhythmia Father    Sleep apnea Father    Heart attack Sister    Obesity Sister    Arthritis Sister    Asthma Sister    Alcohol abuse Brother    Stroke Maternal Uncle    Pancreatic cancer Maternal Uncle    Leukemia Maternal Grandmother    Diabetes Maternal Grandfather    Diabetes Paternal Grandmother    Heart attack Paternal Grandfather    Heart disease Paternal Grandfather    Migraines Daughter    Liver disease Other        Paternal great aunt   Breast cancer Neg Hx    Colon cancer Neg Hx    Esophageal cancer Neg Hx    Inflammatory bowel disease Neg Hx    Rectal cancer Neg Hx    Stomach cancer Neg Hx     Her Social History Is Significant For: Social History   Socioeconomic History   Marital status: Married    Spouse name: Not on file   Number of children: 2   Years of education: Not on file   Highest education level: Bachelor's degree (e.g., BA, AB, BS)  Occupational History   Occupation: Teacher, adult education:   Tobacco Use   Smoking status: Never    Passive exposure: Never   Smokeless tobacco: Never  Vaping Use   Vaping Use: Never used  Substance and Sexual Activity   Alcohol use: Yes    Alcohol/week: 6.0 standard drinks of alcohol    Types: 5 Glasses of wine, 1 Cans of beer per week    Comment: 1 per day   Drug use: No   Sexual activity: Yes    Partners: Male  Other Topics Concern   Not on file   Social History Narrative   Marital status/children/pets: 2 children.    Education/employment: Land:      -Wears a bicycle helmet riding a bike: Yes     -smoke alarm in the home:Yes     - wears seatbelt: Yes     - Feels safe in their relationships: Yes      Social Determinants of Health   Financial Resource Strain: Low Risk  (01/27/2023)  Overall Financial Resource Strain (CARDIA)    Difficulty of Paying Living Expenses: Not hard at all  Food Insecurity: No Food Insecurity (01/27/2023)   Hunger Vital Sign    Worried About Running Out of Food in the Last Year: Never true    Ran Out of Food in the Last Year: Never true  Transportation Needs: No Transportation Needs (01/27/2023)   PRAPARE - Administrator, Civil Service (Medical): No    Lack of Transportation (Non-Medical): No  Physical Activity: Insufficiently Active (01/27/2023)   Exercise Vital Sign    Days of Exercise per Week: 2 days    Minutes of Exercise per Session: 60 min  Stress: Stress Concern Present (01/27/2023)   [employer] of Occupational Health - Occupational Stress Questionnaire    Feeling of Stress : To some extent  Social Connections: Socially Integrated (01/27/2023)   Social Connection and Isolation Panel [NHANES]    Frequency of Communication with Friends and Family: Three times a week    Frequency of Social Gatherings with Friends and Family: Once a week    Attends Religious Services: More than 4 times per year    Active Member of [employer] or Organizations: Yes    Attends Engineer, structural: More than 4 times per year    Marital Status: Married    Her Allergies Are:  Allergies  Allergen Reactions   Lipitor [Atorvastatin Calcium] Other (See Comments)    Memory issue; myalgia   Celebrex [Celecoxib] Rash  :   Her Current Medications Are:  Outpatient Encounter Medications as of 02/24/2023  Medication Sig   aspirin EC 81 MG tablet Take 1 tablet by mouth every other day.    Cholecalciferol (VITAMIN D3) 25 MCG/SPRAY LIQD Take 250 mcg by mouth daily.   cyclobenzaprine (FLEXERIL) 5 MG tablet Take 1 tablet (5 mg total) by mouth 3 (three) times daily as needed for muscle spasms. (Patient taking differently: Take 5 mg by mouth as needed for muscle spasms.)   hydrOXYzine (ATARAX) 10 MG tablet Take 1-2 tablets (10-20 mg total) by mouth 3 (three) times daily (morning, noon and bedtime) as needed for itching.   Polysaccharide Iron Complex 434.8 (200 Fe) MG CAPS Take 1 capsule by mouth daily. (Patient taking differently: Take 1 capsule by mouth every other day.)   progesterone (PROMETRIUM) 100 MG capsule Take 1 capsule (100 mg total) by mouth at bedtime. Increase to 2 capsules (200 mg total) at bedtime prior to cycle.   DULoxetine (CYMBALTA) 20 MG capsule Take 1 capsule (20 mg total) by mouth daily.   FLUoxetine (PROZAC) 10 MG capsule Take 1 capsule (10mg  total) by mouth every other day alternating with 2 capsules (20mg  total) by mouth every other day.   No facility-administered encounter medications on file as of 02/24/2023.  :   Review of Systems:  Out of a complete 14 point review of systems, all are reviewed and negative with the exception of these symptoms as listed below:   Review of Systems  Neurological:        Pt here for sleep consult  Pt snores,some headaches ,some fatigue. Pt denies hypertension,sleep study ,CPAP machine  Pt thinks due to covid 3x is causing fatigue    ESS:8 FSS:32       Objective:  Neurological Exam  Physical Exam Physical Examination:   Vitals:   02/24/23 0846  BP: 111/70  Pulse: 72    General Examination: The patient is a very pleasant 57 y.o. female in  no acute distress. She appears well-developed and well-nourished and well groomed.   HEENT: Normocephalic, atraumatic, pupils are equal, round and reactive to light, extraocular tracking is good without limitation to gaze excursion or nystagmus noted. Hearing is grossly  intact. Face is symmetric with normal facial animation. Speech is clear with no dysarthria noted. There is no hypophonia. There is no lip, neck/head, jaw or voice tremor. Neck is supple with full range of passive and active motion. There are no carotid bruits on auscultation. Oropharynx exam reveals: mild mouth dryness, good dental hygiene and mild airway crowding, due to small airway entry, tonsils on the small side, mildly tethered tongue noted.  Mild overbite noted.  Tongue protrudes centrally and palate elevates symmetrically, Mallampati class II.  Neck circumference 13 5/8 inches.  Chest: Clear to auscultation without wheezing, rhonchi or crackles noted.  Heart: S1+S2+0, regular and normal without murmurs, rubs or gallops noted.   Abdomen: Soft, non-tender and non-distended.  Extremities: There is no obvious edema in the distal lower extremities bilaterally.   Skin: Warm and dry without trophic changes noted.   Musculoskeletal: exam reveals no obvious joint deformities.   Neurologically:  Mental status: The patient is awake, alert and oriented in all 4 spheres. Her immediate and remote memory, attention, language skills and fund of knowledge are appropriate. There is no evidence of aphasia, agnosia, apraxia or anomia. Speech is clear with normal prosody and enunciation. Thought process is linear. Mood is normal and affect is normal.  Cranial nerves II - XII are as described above under HEENT exam.  Motor exam: Normal bulk, strength and tone is noted. There is no obvious action or resting tremor.  Fine motor skills and coordination: grossly intact.  Cerebellar testing: No dysmetria or intention tremor. There is no truncal or gait ataxia.  Sensory exam: intact to light touch in the upper and lower extremities.  Gait, station and balance: She stands easily. No veering to one side is noted. No leaning to one side is noted. Posture is age-appropriate and stance is narrow based. Gait shows normal  stride length and normal pace. No problems turning are noted.   Assessment and Plan:  In summary, ELIANNI TASSI is a very pleasant 57 y.o.-year old female with an underlying medical history of arthritis, status post rotator cuff repair on the L, glaucoma, hyperlipidemia, migraine headaches, anemia, anxiety, depression, and borderline overweight state, whose history and physical exam are concerning for sleep disordered breathing, supporting a current working diagnosis of unspecified sleep apnea, particularly obstructive sleep apnea (OSA). A laboratory attended sleep study is typically considered "gold standard" for evaluation of sleep disordered breathing.   I had a long chat with the patient about my findings and the diagnosis of sleep apnea, particularly OSA, its prognosis and treatment options. We talked about medical/conservative treatments, surgical interventions and non-pharmacological approaches for symptom control. I explained, in particular, the risks and ramifications of untreated moderate to severe OSA, especially with respect to developing cardiovascular disease down the road, including congestive heart failure (CHF), difficult to treat hypertension, cardiac arrhythmias (particularly A-fib), neurovascular complications including TIA, stroke and dementia. Even type 2 diabetes has, in part, been linked to untreated OSA. Symptoms of untreated OSA may include (but may not be limited to) daytime sleepiness, nocturia (i.e. frequent nighttime urination), memory problems, mood irritability and suboptimally controlled or worsening mood disorder such as depression and/or anxiety, lack of energy, lack of motivation, physical discomfort, as well as recurrent headaches, especially morning or nocturnal  headaches. We talked about the importance of maintaining a healthy lifestyle and striving for healthy weight. In addition, we talked about the importance of striving for and maintaining good sleep hygiene. I  recommended a sleep study at this time. I outlined the differences between a laboratory attended sleep study which is considered more comprehensive and accurate over the option of a home sleep test (HST); the latter may lead to underestimation of sleep disordered breathing in some instances and does not help with diagnosing upper airway resistance syndrome and is not accurate enough to diagnose primary central sleep apnea typically. I outlined possible surgical and non-surgical treatment options of OSA, including the use of a positive airway pressure (PAP) device (i.e. CPAP, AutoPAP/APAP or BiPAP in certain circumstances), a custom-made dental device (aka oral appliance, which would require a referral to a specialist dentist or orthodontist typically, and is generally speaking not considered for patients with full dentures or edentulous state), upper airway surgical options, such as traditional UPPP (which is not considered a first-line treatment) or the Inspire device (hypoglossal nerve stimulator, which would involve a referral for consultation with an ENT surgeon, after careful selection, following inclusion criteria - also not first-line treatment). I explained the PAP treatment option to the patient in detail, as this is generally considered first-line treatment.  The patient indicated that she would be willing to try PAP therapy, if the need arises. I explained the importance of being compliant with PAP treatment, not only for insurance purposes but primarily to improve patient's symptoms symptoms, and for the patient's long term health benefit, including to reduce Her cardiovascular risks longer-term.    We will pick up our discussion about the next steps and treatment options after testing.  We will keep her posted as to the test results by phone call and/or MyChart messaging where possible.  We will plan to follow-up in sleep clinic accordingly as well.  I answered all her questions today and the patient  was in agreement.   I encouraged her to call with any interim questions, concerns, problems or updates or email [place] through MyChart.  Generally speaking, sleep test authorizations may take up to 2 weeks, sometimes less, sometimes longer, the patient is encouraged to get in touch with [place] if they do not hear back from the sleep lab staff directly within the next 2 weeks.  Thank you very much for allowing me to participate in the care of this nice patient. If I can be of any further assistance to you please do not hesitate to call me at [phone].  Sincerely,   Michele Foley, MD, PhD

## 2023-03-11 ENCOUNTER — Ambulatory Visit (INDEPENDENT_AMBULATORY_CARE_PROVIDER_SITE_OTHER): Payer: 59

## 2023-03-11 ENCOUNTER — Ambulatory Visit (INDEPENDENT_AMBULATORY_CARE_PROVIDER_SITE_OTHER): Payer: 59 | Admitting: Family Medicine

## 2023-03-11 VITALS — BP 120/84 | HR 74 | Ht 62.0 in

## 2023-03-11 DIAGNOSIS — R102 Pelvic and perineal pain: Secondary | ICD-10-CM | POA: Diagnosis not present

## 2023-03-11 DIAGNOSIS — M25551 Pain in right hip: Secondary | ICD-10-CM | POA: Diagnosis not present

## 2023-03-11 DIAGNOSIS — M5136 Other intervertebral disc degeneration, lumbar region: Secondary | ICD-10-CM | POA: Diagnosis not present

## 2023-03-11 DIAGNOSIS — M51369 Other intervertebral disc degeneration, lumbar region without mention of lumbar back pain or lower extremity pain: Secondary | ICD-10-CM | POA: Insufficient documentation

## 2023-03-11 DIAGNOSIS — M545 Low back pain, unspecified: Secondary | ICD-10-CM | POA: Diagnosis not present

## 2023-03-11 MED ORDER — METHYLPREDNISOLONE ACETATE 80 MG/ML IJ SUSP
80.0000 mg | Freq: Once | INTRAMUSCULAR | Status: AC
  Administered 2023-03-11: 80 mg via INTRAMUSCULAR

## 2023-03-11 MED ORDER — KETOROLAC TROMETHAMINE 60 MG/2ML IM SOLN
60.0000 mg | Freq: Once | INTRAMUSCULAR | Status: AC
  Administered 2023-03-11: 60 mg via INTRAMUSCULAR

## 2023-03-11 NOTE — Progress Notes (Signed)
[name] [address] [place] [address] Phone: [phone] Subjective:   [name], am serving as a scribe for Dr. Antoine Primas.  I'm seeing this patient by the request  of:  [name], [name], DO  CC: Low back and pelvis pain.  [id number]  Michele [name] is a 57 y.o. female coming in with complaint of back and neck pain Patient states follow up for low back pain. Medication switch not helping much. No new concerns.  Medications patient has been prescribed: Cymbalta but had to discontinue secondary to insomnia, patient has been sent for sleep study as well.  Taking:         Reviewed prior external information including notes and imaging from previsou exam, outside providers and external EMR if available.   As well as notes that were available from care everywhere and other healthcare systems.  Past medical history, social, surgical and family history all reviewed in electronic medical record.  No pertanent information unless stated regarding to the chief complaint.   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   DDD (degenerative disc disease), cervical    Depression    Dorsalgia    Glaucoma    Hypercholesterolemia    Intestinal gas excretion    Memory loss 01/13/2020   Migraine 08/17/2020   Nonallopathic lesion of cervical region 01/23/2021   l areas are chronic   Patient tolerated the procedure well with improvement in symptoms  Patient given exercises, stretches and lifestyle modifications  See medications in patient instructions if given  Patient will follow up in 4-8 weeks   Peptic ulcer    Scapular dyskinesis 01/23/2021   Scarlet fever    Sequelae of other specified infectious and parasitic  diseases 11/20/2021   Skin cancer    Tick bite 02/22/2018   UTI (urinary tract infection)    Yeast infection     Allergies  Allergen Reactions   Lipitor [Atorvastatin Calcium] Other (See Comments)    Memory issue; myalgia   Celebrex [Celecoxib] Rash     Review of Systems:  No headache, visual changes, nausea, vomiting, diarrhea, constipation, dizziness, abdominal pain, skin rash, fevers, chills, night sweats, weight loss, swollen lymph nodes, body aches, joint swelling, chest pain, shortness of breath, mood changes. POSITIVE muscle aches  Objective  Blood pressure 120/84, pulse 74, height 5\' 2"  (1.575 m), last menstrual period 11/29/2021, SpO2 98 %.   General: No apparent distress alert and oriented x3 mood and affect normal, dressed appropriately.  HEENT: Pupils equal, extraocular movements intact  Respiratory: Patient's speak in full sentences and does not appear short of breath  Cardiovascular: No lower extremity edema, non tender, no erythema  Gait normal overall guarded MSK:  Back  Low back does have significant loss of lordosis noted.  Patient does have significant voluntary guarding noted.  Worsening pain with extension of the back.  More tenderness to palpation over the L4, L5 and S1 area right greater than left.  Pain is out of proportion to the amount of palpation.  Worsening pain in the back in the groin area with flexion of the hip against resistance.       Assessment and Plan:  Degenerative disc disease,  lumbar Patient does have significant degenerative disc disease mostly at the L4-L5 area.  Worsening pain with extension.  Concern significantly though that patient is having worsening pain out of proportion.  Patient has failed conservative therapy including Cymbalta, gabapentin, formal physical therapy.  We have attempted osteopathic manipulation as well.  Patient's pain is out of proportion and with the longevity of this I do feel that advanced imaging is warranted.   Would recommend an MRI of the lumbar and pelvis area.  See if anything is potentially contributing and see what type of or operative care is necessary.  Follow-up with me again in 6 to 8 weeks otherwise.        The above documentation has been reviewed and is accurate and complete Judi Saa, DO         Note: This dictation was prepared with Dragon dictation along with smaller phrase technology. Any transcriptional errors that result from this process are unintentional.

## 2023-03-11 NOTE — Assessment & Plan Note (Addendum)
Patient does have significant degenerative disc disease mostly at the L4-L5 area.  Worsening pain with extension.  Concern significantly though that patient is having worsening pain out of proportion.  Patient has failed conservative therapy including Cymbalta, gabapentin, formal physical therapy.  We have attempted osteopathic manipulation as well.  Patient's pain is out of proportion and with the longevity of this I do feel that advanced imaging is warranted.  Would recommend an MRI of the lumbar and pelvis area.  See if anything is potentially contributing and see what type of or operative care is necessary.  Follow-up with me again in 6 to 8 weeks otherwise.  Toradol and Depo-Medrol given today.

## 2023-03-11 NOTE — Patient Instructions (Addendum)
Injections in backside MRI lumbar  MRI pelvis [phone] We will be in touch

## 2023-03-18 ENCOUNTER — Telehealth: Payer: Self-pay | Admitting: Neurology

## 2023-03-18 NOTE — Telephone Encounter (Signed)
03/18/23 [facility] [place] 5/23:[id number] 02/26/23 [facility] [employer] no auth req EE

## 2023-03-26 NOTE — Telephone Encounter (Signed)
Please see the MyChart message reply(ies) for my assessment and plan.    This patient gave consent for this Medical Advice Message and is aware that it may result in a bill to their insurance company, as well as the possibility of receiving a bill for a co-payment or deductible. They are an established patient, but are not seeking medical advice exclusively about a problem treated during an in person or video visit in the last seven days. I did not recommend an in person or video visit within seven days of my reply.    I spent a total of 11 minutes cumulative time within 7 days through MyChart messaging.   Roshelle Traub B, MD  

## 2023-03-31 ENCOUNTER — Other Ambulatory Visit (HOSPITAL_BASED_OUTPATIENT_CLINIC_OR_DEPARTMENT_OTHER): Payer: Self-pay

## 2023-03-31 ENCOUNTER — Other Ambulatory Visit: Payer: Self-pay

## 2023-04-01 ENCOUNTER — Other Ambulatory Visit (HOSPITAL_BASED_OUTPATIENT_CLINIC_OR_DEPARTMENT_OTHER): Payer: Self-pay

## 2023-04-04 ENCOUNTER — Encounter: Payer: Self-pay | Admitting: Family Medicine

## 2023-04-07 ENCOUNTER — Telehealth: Payer: Self-pay | Admitting: Gastroenterology

## 2023-04-07 DIAGNOSIS — M79671 Pain in right foot: Secondary | ICD-10-CM | POA: Diagnosis not present

## 2023-04-07 DIAGNOSIS — L84 Corns and callosities: Secondary | ICD-10-CM | POA: Diagnosis not present

## 2023-04-07 NOTE — Telephone Encounter (Signed)
Patient called states she is having chronic abdominal pain and is seeking advise as soon as possible.

## 2023-04-08 ENCOUNTER — Ambulatory Visit
Admission: EM | Admit: 2023-04-08 | Discharge: 2023-04-08 | Disposition: A | Discharge status: Home / Self Care | Payer: 59 | Attending: Urgent Care | Admitting: Urgent Care

## 2023-04-08 ENCOUNTER — Ambulatory Visit (INDEPENDENT_AMBULATORY_CARE_PROVIDER_SITE_OTHER): Payer: 59

## 2023-04-08 DIAGNOSIS — R1084 Generalized abdominal pain: Secondary | ICD-10-CM

## 2023-04-08 NOTE — ED Provider Notes (Signed)
[facility]  Note:  This document was prepared using Conservation officer, historic buildings and may include unintentional dictation errors.  MRN: [id number] DOB: 09-20-1966  Subjective:   Michele [name] is a 57 y.o. female presenting for 1 week history of persistent moderate to severe upper abdominal pain, nausea without vomiting.  No fever, bloody stools, recent antibiotic use, hospitalizations or long distance travel.  Has not eaten raw foods, drank unfiltered water.  Patient has had extensive workup including a colonoscopy, endoscopy.  She does have a history of a peptic ulcer but has not posed any issues recently.  She does have a history of abdominal bloating and constipation especially since starting management with iron supplementation.  Has had a dramatic change in her stools, does not have a bowel movement every day and when she does have a bowel movement, has small round hard stools.   No current facility-administered medications for this encounter.  Current Outpatient Medications:    aspirin EC 81 MG tablet, Take 1 tablet by mouth every other day., Disp: , Rfl:    Cholecalciferol (VITAMIN D3) 25 MCG/SPRAY LIQD, Take 250 mcg by mouth daily., Disp: , Rfl:    cyclobenzaprine (FLEXERIL) 5 MG tablet, Take 1 tablet (5 mg total) by mouth 3 (three) times daily as needed for muscle spasms. (Patient taking differently: Take 5 mg by mouth as needed for muscle spasms.), Disp: 90 tablet, Rfl: 0   DULoxetine (CYMBALTA) 20 MG capsule, Take 1 capsule (20 mg total) by mouth daily., Disp: 30 capsule, Rfl: 2   FLUoxetine (PROZAC) 10 MG capsule, Take 1 capsule (10mg  total) by mouth every other day alternating with 2 capsules (20mg  total) by mouth every other day., Disp: 135 capsule, Rfl: 3   hydrOXYzine (ATARAX) 10 MG tablet, Take 1-2 tablets (10-20 mg total) by mouth 3 (three) times daily (morning, noon and bedtime) as needed for itching., Disp: 30 tablet, Rfl: 1   Polysaccharide  Iron Complex 434.8 (200 Fe) MG CAPS, Take 1 capsule by mouth daily. (Patient taking differently: Take 1 capsule by mouth every other day.), Disp: 30 capsule, Rfl: 5   progesterone (PROMETRIUM) 100 MG capsule, Take 1 capsule (100 mg total) by mouth at bedtime. 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   DDD (degenerative disc disease), cervical    Depression    Dorsalgia    Glaucoma    Hypercholesterolemia    Intestinal gas excretion    Memory loss 01/13/2020   Migraine 08/17/2020   Nonallopathic lesion of cervical region 01/23/2021   l areas are chronic   Patient tolerated the procedure well with improvement in symptoms  Patient given exercises, stretches and lifestyle modifications  See medications in patient instructions if given  Patient will follow up in 4-8 weeks   Peptic ulcer    Scapular dyskinesis 01/23/2021   Scarlet fever    Sequelae of other specified infectious and parasitic diseases 11/20/2021   Skin cancer  Tick bite 02/22/2018   UTI (urinary tract infection)    Yeast infection      Past Surgical History:  Procedure Laterality Date   BASAL CELL CARCINOMA EXCISION     BREAST BIOPSY Left 1992   Duct removal (No scar seen)    BREAST EXCISIONAL BIOPSY Left 1992   COLONOSCOPY  07/2021   DACRORHINOCYSTOTOMY  10/2020   LEFT HEART CATH AND CORONARY ANGIOGRAPHY N/A 01/16/2018   Procedure: LEFT HEART CATH AND CORONARY ANGIOGRAPHY;  Surgeon: Yvonne Kendall, MD;  Location: [facility];  Service:  Cardiovascular;  Laterality: N/A;   SHOULDER ARTHROSCOPY WITH ROTATOR CUFF REPAIR AND SUBACROMIAL DECOMPRESSION Left 11/12/2022   Procedure: SHOULDER ARTHROSCOPY WITH ROTATOR CUFF DEBBRIDEMENT AND SUBACROMIAL DECOMPRESSION;  Surgeon: Jones Broom, MD;  Location: [facility];  Service: Orthopedics;  Laterality: Left;   UPPER GASTROINTESTINAL ENDOSCOPY  07/2021    Family History  Problem Relation Age of Onset   Hypertension Mother    Diabetes Mother    Diverticulitis Mother    Heart failure Mother    Hyperlipidemia Father    Diabetes Father    Heart attack Father    Arrhythmia Father    Sleep apnea Father    Heart attack Sister    Obesity Sister    Arthritis Sister    Asthma Sister    Alcohol abuse Brother    Stroke Maternal Uncle    Pancreatic cancer Maternal Uncle    Leukemia Maternal Grandmother    Diabetes Maternal Grandfather    Diabetes Paternal Grandmother    Heart attack Paternal Grandfather    Heart disease Paternal Grandfather    Migraines Daughter    Liver disease Other        Paternal great aunt   Breast cancer Neg Hx    Colon cancer Neg Hx    Esophageal cancer Neg Hx    Inflammatory bowel disease Neg Hx    Rectal cancer Neg Hx    Stomach cancer Neg Hx     Social History   Tobacco Use   Smoking status: Never    Passive exposure: Never   Smokeless tobacco: Never  Vaping Use   Vaping Use: Never used  Substance Use Topics   Alcohol use: Yes    Alcohol/week: 6.0 standard drinks of alcohol    Types: 5 Glasses of wine, 1 Cans of beer per week    Comment: 1 per day   Drug use: No    ROS   Objective:   Vitals: BP 115/73 (BP Location: Left Arm)   Pulse 68   Temp 99 F (37.2 C) (Oral)   Resp 18   LMP 11/29/2021   SpO2 97%   Physical Exam Constitutional:      General: She is not in acute distress.    Appearance: Normal appearance. She is well-developed. She is not ill-appearing, toxic-appearing or diaphoretic.  HENT:     Head:  Normocephalic and atraumatic.     Nose: Nose normal.     Mouth/Throat:     Mouth: Mucous membranes are moist.     Pharynx: Oropharynx is clear.  Eyes:     General: No scleral icterus.       Right eye: No discharge.        Left eye: No discharge.     Extraocular Movements: Extraocular movements intact.     Conjunctiva/sclera: Conjunctivae normal.  Cardiovascular:     Rate and Rhythm: Normal rate.  Pulmonary:  Effort: Pulmonary effort is normal.  Abdominal:     General: Bowel sounds are normal. There is no distension.     Palpations: Abdomen is soft. There is no mass.     Tenderness: There is generalized abdominal tenderness and tenderness in the right lower quadrant, periumbilical area and left lower quadrant. There is no right CVA tenderness, left CVA tenderness, guarding or rebound.  Skin:    General: Skin is warm and dry.  Neurological:     General: No focal deficit present.     Mental Status: She is alert and oriented to person, place, and time.  Psychiatric:        Mood and Affect: Mood normal.        Behavior: Behavior normal.        Thought Content: Thought content normal.        Judgment: Judgment normal.    DG Abd 1 View  Result Date: 04/08/2023 CLINICAL DATA:  Generalized abdominal pain EXAM: ABDOMEN - 1 VIEW COMPARISON:  None Available. FINDINGS: Nonobstructive bowel-gas pattern. Large stool burden. No radio-opaque calculi or other significant radiographic abnormality are seen. IMPRESSION: Nonobstructive bowel-gas pattern. Large stool burden. Electronically Signed   By: Allegra Lai M.D.   On: 04/08/2023 16:43    Assessment and Plan :   PDMP not reviewed this encounter.  1. Generalized abdominal pain    No signs of an acute abdomen. Will manage for pain related to constipation. Patient has Miralax that she plans on starting at home. Recheck with PCP about the iron supplementation. Counseled patient on potential for adverse effects with medications  prescribed/recommended today, ER and return-to-clinic precautions discussed, patient verbalized understanding.    Wallis Bamberg, [place] 04/08/23 [id number]

## 2023-04-08 NOTE — ED Triage Notes (Signed)
Pt c/o intermittent epigastric pain, nausea x 1 week-NAD-steady gait

## 2023-04-08 NOTE — Telephone Encounter (Signed)
Left message on machine to call back  

## 2023-04-08 NOTE — Discharge Instructions (Signed)
For moderate to severe constipation (not having a bowel movement in more than 3 days) then try to use an enema or Miralax (17g/1 packet/1 scoop) once daily until you have a good bowel movement.  It is not a good idea to use an enema or laxatives daily. If you find you are doing this, then please follow up with a gastroenterologist. Otherwise, a medication you could use daily to help with promoting bowel movements is docusate (Colace) 100mg . It is okay to use this 1-2 times daily as a stool softener.  Try to stay active physically including regular exercise 2-3 times a week.  Make sure you hydrate well every day with about 64 ounces of water daily (that is 2 liters).  Try to avoid carb heavy foods, dairy. This includes cutting out breads, pasta, pizza, pastries, potatoes, rice, starchy foods in general. Eat more fiber as listed below:  Salads - kale, spinach, cabbage, spring mix, arugula Fruits - avocadoes, berries (blueberries, raspberries, blackberries), apples, oranges, pomegranate, grapefruit, kiwi Vegetables - asparagus, cauliflower, broccoli, green beans, brussel sprouts, bell peppers, beets; stay away from or limit starchy vegetables like potatoes, carrots, peas Other general foods - kidney beans, egg whites, almonds, walnuts, sunflower seeds, pumpkin seeds, fat free yogurt, almond milk, flax seeds, quinoa, oats  Meat - It is better to eat lean meats and limit your red meat including pork to once a week.  Wild caught fish, chicken breast are good options as they tend to be leaner sources of good protein. Still be mindful of the sodium labels for the meats you buy.  DO NOT EAT ANY FOODS ON THIS LIST THAT YOU ARE ALLERGIC TO. For more specific needs, I highly recommend consulting a dietician or nutritionist but this can definitely be a good starting point.

## 2023-04-08 NOTE — Telephone Encounter (Signed)
I spoke with the pt and she tells me that she has chronic abd pain and she had a "flare" yesterday for about 4 hours.  She ate soup and the pain resolved.  NO pain today.  I offered to make an appt for her with [facility] vs app next available but pt declined.  She states she will see if her PCP can see her.  I did ask her to call back if she changes her mind.

## 2023-04-14 ENCOUNTER — Ambulatory Visit: Payer: Self-pay | Admitting: Neurology

## 2023-04-15 ENCOUNTER — Other Ambulatory Visit: Payer: Self-pay | Admitting: Obstetrics and Gynecology

## 2023-04-15 DIAGNOSIS — Z1231 Encounter for screening mammogram for malignant neoplasm of breast: Secondary | ICD-10-CM

## 2023-04-28 ENCOUNTER — Ambulatory Visit: Admission: RE | Admit: 2023-04-28 | Payer: 59 | Source: Ambulatory Visit

## 2023-04-28 ENCOUNTER — Ambulatory Visit
Admission: RE | Admit: 2023-04-28 | Discharge: 2023-04-28 | Disposition: A | Discharge status: Home / Self Care | Payer: 59 | Source: Ambulatory Visit | Attending: Family Medicine | Admitting: Family Medicine

## 2023-04-28 DIAGNOSIS — M5136 Other intervertebral disc degeneration, lumbar region: Secondary | ICD-10-CM | POA: Diagnosis not present

## 2023-04-28 DIAGNOSIS — M545 Low back pain, unspecified: Secondary | ICD-10-CM

## 2023-04-28 DIAGNOSIS — M25552 Pain in left hip: Secondary | ICD-10-CM | POA: Diagnosis not present

## 2023-04-28 DIAGNOSIS — M25551 Pain in right hip: Secondary | ICD-10-CM

## 2023-04-28 DIAGNOSIS — M48061 Spinal stenosis, lumbar region without neurogenic claudication: Secondary | ICD-10-CM | POA: Diagnosis not present

## 2023-04-28 MED ORDER — GADOPICLENOL 0.5 MMOL/ML IV SOLN
7.0000 mL | Freq: Once | INTRAVENOUS | Status: AC | PRN
  Administered 2023-04-28: 7 mL via INTRAVENOUS

## 2023-05-05 ENCOUNTER — Encounter: Payer: Self-pay | Admitting: Neurology

## 2023-05-05 ENCOUNTER — Ambulatory Visit (INDEPENDENT_AMBULATORY_CARE_PROVIDER_SITE_OTHER): Payer: 59 | Admitting: Neurology

## 2023-05-05 VITALS — BP 95/63 | HR 67 | Ht 62.0 in | Wt 136.0 lb

## 2023-05-05 DIAGNOSIS — M7918 Myalgia, other site: Secondary | ICD-10-CM | POA: Insufficient documentation

## 2023-05-05 DIAGNOSIS — M5481 Occipital neuralgia: Secondary | ICD-10-CM | POA: Insufficient documentation

## 2023-05-05 DIAGNOSIS — G43901 Migraine, unspecified, not intractable, with status migrainosus: Secondary | ICD-10-CM | POA: Insufficient documentation

## 2023-05-05 DIAGNOSIS — G500 Trigeminal neuralgia: Secondary | ICD-10-CM | POA: Diagnosis not present

## 2023-05-05 NOTE — Patient Instructions (Addendum)
[email or web address]  [email or web address] PT and dry needling to [place] Lidoderm patches Dry needling Massage Chiropractic - [place] Ree Kida at [facility], Dr Fuller Plan Botox for "cervical dystonia" Could do nerve blocks  Occipital Neuralgia  Occipital neuralgia is a type of headache that causes brief episodes of very bad pain in the back of the head. Pain from occipital neuralgia may spread (radiate) to other parts of the head. These headaches may be caused by irritation of the nerves that leave the spinal cord high up in the neck, just below the base of the skull (occipital nerves). The occipital nerves transmit sensations from the back of the head, the top of the head, and the areas behind the ears. What are the causes? This condition can occur without any known cause (primary headache syndrome). In other cases, this condition is caused by pressure on or irritation of one of the two occipital nerves. Pressure and irritation may be due to: Muscle spasm in the neck. Neck injury. Wear and tear of the vertebrae in the neck (osteoarthritis). Disease of the disks that separate the vertebrae. Swollen blood vessels that put pressure on the occipital nerves. Infections. Tumors. Diabetes. What are the signs or symptoms? This condition causes brief burning, stabbing, electric, shocking, or shooting pain in the back of the head that can radiate to the top of the head. It can happen on one side or both sides of the head. It can also cause: Pain behind the eye. Pain triggered by neck movement or hair brushing. Scalp tenderness. Aching in the back of the head between episodes of very bad pain. Pain that gets worse with exposure to bright lights. How is this diagnosed? Your health care provider may diagnose the condition based on a physical exam and your symptoms. Tests may be done, such as: Imaging studies of the brain and neck (cervical spine), such as an MRI or CT scan. These  look for causes of pinched nerves. Applying pressure to the nerves in the neck to try to re-create the pain. Injection of numbing medicine into the occipital nerve areas to see if pain goes away (diagnostic nerve block). How is this treated? Treatment for this condition may begin with simple measures, such as: Rest. Massage. Applying heat or cold to the area. Over-the-counter pain relievers. If these measures do not work, you may need other treatments, including: Medicines, such as: Prescription-strength anti-inflammatory medicines. Muscle relaxants. Anti-seizure medicines, which can relieve pain. Antidepressants, which can relieve pain. Injected medicines, such as medicines that numb the area (local anesthetic) and steroids. Pulsed radiofrequency ablation. This is when wires are implanted to deliver electrical impulses that block pain signals from the occipital nerve. Surgery to relieve nerve pressure. Physical therapy. Follow these instructions at home: Managing pain     Avoid any activities that cause pain. Rest when you have an attack of pain. Try gentle massage to relieve pain. Try a different pillow or sleeping position. If directed, apply heat to the affected area as often as told by your health care provider. Use the heat source that your health care provider recommends, such as a moist heat pack or a heating pad. Place a towel between your skin and the heat source. Leave the heat on for 20-30 minutes. Remove the heat if your skin turns bright red. This is especially important if you are unable to feel pain, heat, or cold. You have a greater risk of getting burned. If directed, put ice on the back of your head and neck area.  To do this: Put ice in a plastic bag. Place a towel between your skin and the bag. Leave the ice on for 20 minutes, 2-3 times a day. Remove the ice if your skin turns bright red. This is very important. If you cannot feel pain, heat, or cold, you have  a greater risk of damage to the area. General instructions Take over-the-counter and prescription medicines only as told by your health care provider. Avoid things that make your symptoms worse, such as bright lights. Try to stay active. Get regular exercise that does not cause pain. Ask your health care provider to suggest safe exercises for you. Work with a physical therapist to learn stretching exercises you can do at home. Practice good posture. Keep all follow-up visits. This is important. Contact a health care provider if: Your medicine is not working. You have new or worsening symptoms. Get help right away if: You have very bad head pain that does not go away. You have a sudden change in vision, balance, or speech. These symptoms may represent a serious problem that is an emergency. Do not wait to see if the symptoms will go away. Get medical help right away. Call your local emergency services (911 in the [place] Do not drive yourself to the hospital. Summary Occipital neuralgia is a type of headache that causes brief episodes of very bad pain in the back of the head. Pain from occipital neuralgia may spread (radiate) to other parts of the head. Treatment for this condition includes rest, massage, and medicines. This information is not intended to replace advice given to you by your health care provider. Make sure you discuss any questions you have with your health care provider. Document Revised: 08/06/2020 Document Reviewed: 08/06/2020 Elsevier Patient Education  2024 [employer].

## 2023-05-05 NOTE — Progress Notes (Signed)
[facility]    Provider:  Dr Lucia Gaskins Referring Provider: Natalia Leatherwood, DO Primary Care Physician:  Natalia Leatherwood, DO  CC: Headaches, temple pain, left-sided TGN and left-sided occipital neuralgia  May 05, 2023: Patient is here for follow-up on trigeminal neuralgia.  MRI did show a blood vessel compressing the sensory nerve that was likely causing her facial pain.  She did see Dr. Tempie Donning at [facility] who noted "her brain MRI does show a prominent vessel abutting her left trigeminal nerve in the prepontine cistern" they discussed gamma knife and decompression surgery.  I reviewed notes from [facility] initial consult which was September 06, 2022: Dr. Alvino Chapel, diagnosis was left type II trigeminal neuralgia, the neurosurgeon was Dr. Tempie Donning and the radiation oncologist was Dr. Farris Has, they noted onset of pain 8 years ago and her trigeminal pain has been primarily evaluated and managed by myself initially referred in 2019 to [place] described as stabbing shooting and migrainous with workup showing a prominent blood vessel, trialed gabapentin, Topamax, Emgality, baclofen, amitriptyline, verapamil and pain medication decision was to repeat MRI trigeminal protocol where the nerve was seen compressed by vessel.  She also described occipital pain.  They explained the risks and benefits of a course of gamma knife stereotactic radiosurgery.  She also saw Dr. Tempie Donning for consideration of radiosurgery or microvascular decompression.  Since then I do not see that [facility] has [facility] Farris Has or Dr. Alvino Chapel or Dr. Tempie Donning.  She saw [facility]. I let her know we have someone in town now(Dr. Maurice Small) who performs the same procedures. She has been on Gabapentin for her TGN and occipital neuralgia. She has had side effects to the Gabapentin. It has become much less frequent. Maybe(not even) once a week. It can be a 5/10. She has pain in her ear. She also has occipital  neuralgia.shooting pain on the left, had it twice this week, brief but severe.she had dry needling and is going to massage called rolfing realloy deep and painful but helps. Discussed other ways to help w ith posture and neck pain.   Patient complains of symptoms per HPI as well as the following symptoms: none . Pertinent negatives and positives per HPI. All others negative   09/30/2022: MRI trigeminal showed: MRI shows a blood vessel compressing the sensory nerve that is likely causing her facial pain. She saw Dr. Tempie Donning at [facility] who noted "Her brain MRI does show a prominent vessel abutting her left trigeminal nerve in the prepontine cistern". They discussed gamma knife and decompression surgery. Patient confirms this. Patient reports that her pain She still has daily headaches. She has a sharp throbbing pain left temporal area. Daily. Intermittent. She has some eye dripping on that side but does not appear to be related to the TGN.  MRI 07/09/2022:    IMPRESSION: reviewed images and agree 1. Similar appearance of a branch vessel crossing over the top of the exiting left fifth cranial nerve. 2. No other focal cranial nerve lesions. 3. Normal appearance of the brainstem and visualized intracranial contents.  Patient complains of symptoms per HPI as well as the following symptoms: left temple shooting pain . Pertinent negatives and positives per HPI. All others negative     06/26/2022: She is here today for headaches and radiating pain fom temple to eyebrow per Dr. Claiborne Billings. We saw patient in 2019 for multiple complaints and pain, we performed an extensive blood work investigation  and MRI brain and cervical spine. Labs were all normal/negative including lyme's disease and investigations below. Since then she has had multiple other bloodwork including repeats of some of those below. PMHx HLD, GERD, anxiety, back pain, DDD, she is a nurse at [employer] has also been seen for chest pain  in the past, fatigue and other somatic complaints. We could never find any causes. Daily she has tample pain, shooting in the left temple, stabbing, every now and then it comes above the eyebrow, more stabbing that it is migrainous. No vision changes, every single day and when she lays down. 4 times a weak she gets a little imbalance\d, does yoga every day.  Her left eye drips. She lives with it can be 4-7/8/ Shooting with lacrimation.   Medications tried: verapamil contraindicated.   Reviewed notes, labs and imaging from outside physicians, which showed:  Orders Placed last encounter in 2019 were all unremarkable/neg or normal except for mild cerebellar ectopia.   Procedures   MR FACE/TRIGEMINAL WO/W CM   MR BRAIN W WO CONTRAST   TSH   Sedimentation rate   B12 and Folate Panel   Tissue transglutaminase, IgA   Gliadin antibodies, serum   Heavy metals, blood   Vitamin B6   Methylmalonic acid, serum   Vitamin B1   ANA, IFA (with reflex)   C-reactive protein   Basic Metabolic Panel   B. burgdorfi Antibody   05/2022: esr normal, BUN 10 creatining 0.81  MRI 06-11-2022 showed possible left C5 radiculopathy:  Disc levels:   C2-C3:  No stenosis.   C3-C4:  No stenosis.  Trace central protrusion.  No stenosis.   C4-C5: Left paracentral protrusion partially effaces the left ventral subarachnoid space. No significant canal or foraminal stenosis.   C5-C6:  Minimal disc bulge.  No canal or foraminal stenosis.   C6-C7:  No stenosis.   C7-T1:  No stenosis.   IMPRESSION: No high-grade stenosis. Left paracentral disc protrusion at C4-C5; correlate for C5 radicular symptoms.  2019: MRI face/trigem nerve:  FINDINGS: On the sagittal images, the cerebellar tonsils extend just below the foramen magnum but not enough to be considered a Chiari malformation.  The spinal cord is imaged quarterly to C4 and is normal.  The pituitary gland and the optic chiasm appear normal.  The cerebellum,  brainstem and visible portion of the brain appears normal.  The trigeminal nerves are followed from the pons into Meckel's caves and appear normal.  A blood vessel is adjacent to the left trigeminal nerve dorsal root entry zone but it does not distort the nerve.    There is minimal mucoperiosteal thickening of some of the ethmoid air cells and the other paranasal sinuses appear normal.  The 7th/8th nerve complexes appear normal.  The orbits appear normal.    Incidental note is made of asymmetry of the transverse and sigmoid sinuses and jugular veins, larger on the right than the left.  This is a normal variant.  Flow voids are noted in the major intracerebral arteries.    IMPRESSION: This MRI of the face and trigeminal nerve region appears normal.  There is a blood vessel adjacent to the dorsal root entry zone of the left trigeminal nerve but it does not cause any distortion.   FINDINGS:  The brain parenchyma shows no abnormal signal intensities.  No structural lesion, tumor or infarcts are noted.  The paranasal sinuses show mild chronic inflammatory changes.  The pituitary gland  appears normal.  The cerebellar  tonsils are 4 to 5 mm below the foramen magnum for borderline type I Arnold-Chiari malformation with some crowding of the craniovertebral junction.  Extra-axial brain structures appear normal.  Orbits appear unremarkable paranasal sinuses show mild chronic inflammatory changes.  The subarachnoid spaces and ventricular system appear normal.  Diffusion-weighted imaging is negative for acute ischemia.  Gradient echo images do not show significant microhemorrhages.  Thin sections through the brainstem showed normal symmetric appearance of both trigeminal nerves without any abnormal vascular loop or masses noted.  Postcontrast images do not result in abnormal areas of enhancement.      2019: MRI brain : FINDINGS:  The brain parenchyma shows no abnormal signal intensities.  No structural lesion, tumor  or infarcts are noted.  The paranasal sinuses show mild chronic inflammatory changes.  The pituitary gland  appears normal.  The cerebellar tonsils are 4 to 5 mm below the foramen magnum for borderline type I Arnold-Chiari malformation with some crowding of the craniovertebral junction.  Extra-axial brain structures appear normal.  Orbits appear unremarkable paranasal sinuses show mild chronic inflammatory changes.  The subarachnoid spaces and ventricular system appear normal.  Diffusion-weighted imaging is negative for acute ischemia.  Gradient echo images do not show significant microhemorrhages.  Thin sections through the brainstem showed normal symmetric appearance of both trigeminal nerves without any abnormal vascular loop or masses noted.  Postcontrast images do not result in abnormal areas of enhancement.         IMPRESSION: Unremarkable MRI scan of the brain with and without contrast.  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  HPI 02/20/2018:  Michele [name] is a 57 y.o. female here as a referral from Dr. Claiborne Billings for trigeminal neuralgia but she says she is here to discuss multiple  Problems she thinks is related to lyme disease.  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 She had a tick bite in June. In August she started getting infrequent sharp shooting pain behind her left ear (points behind the tragus) lasts 2-3 seconds, started to become more frequent once a week. Now it is every other day twice a day. She is taking a B complex. Periodically her bottom lip wil quiver, she can feel it but no one can see it. She also has a pain poking in her temple, not as sharp as behind the ear, but sharp, also 3 seconds and then another 3 seconds the whole episode can last 30 minutes - 3 hours, no autonomic symptoms with the stabbing, the 2 pains  (behind the ear and in the temple) don;t seem to be associated. Left eye was "twitching" for 45 minutes. She also has "random strange things". A month ago her forearm started hurting severely for an hour and went away. She was lying in bed later that week and her legs ached for 20 minutes then gone. Yesterday her groin hurt for 15 minutes. Odd transient incidents that happen once. Since her root canal she has intermittent belly pain and slight nausea and awful smeeling gas. She has chest discomfort. Not sensitive to sunlight or have rashes with sunlight. 10-12 headache free days. Emgality is helping. She had a lot of side effects to the triptans, would help then rebound.   Reviewed notes, labs and imaging from outside physicians, which showed:  12/26/2016: CT head showed No acute intracranial abnormalities including mass lesion or  mass effect, hydrocephalus, extra-axial fluid collection, midline shift, hemorrhage, or acute infarction, large ischemic events (personally reviewed images)  Bmp unremarkable, CMP with anemia (10.8/33.9) and platelets 407 otherwise normal.   Reviewed referring physician's notes, examination normal, she has anxiety on Prozac, she has back pain, she is been seen by GI for abdominal bloating and lower abdominal pelvic discomfort also seen by cardiology for chest pain and shortness of breath, she had a colonoscopy, she has high cholesterol LDL 161 but she deferred treatment  Review of Systems: Patient complains of symptoms per HPI as well as the following symptoms: sleepiness, cramps, decreased energy. Pertinent negatives and positives per HPI. All others negative.   Social History   Socioeconomic History   Marital status: Married    Spouse name: Not on file   Number of children: 2   Years of education: Not on file   Highest education level: Bachelor's degree (e.g., BA, AB, BS)  Occupational History   Occupation: Teacher, adult education: [place]  Tobacco Use   Smoking  status: Never    Passive exposure: Never   Smokeless tobacco: Never  Vaping Use   Vaping status: Never Used  Substance and Sexual Activity   Alcohol use: Yes    Alcohol/week: 6.0 standard drinks of alcohol    Types: 5 Glasses of wine, 1 Cans of beer per week    Comment: 1 per day   Drug use: No   Sexual activity: Yes    Partners: Male  Other Topics Concern   Not on file  Social History Narrative   Marital status/children/pets: 2 children.    Education/employment: Land:      -Wears a bicycle helmet riding a bike: Yes     -smoke alarm in the home:Yes     - wears seatbelt: Yes     - Feels safe in their relationships: Yes      Social Determinants of Health   Financial Resource Strain: Low Risk  (01/27/2023)   Overall Financial Resource Strain (CARDIA)    Difficulty of Paying Living Expenses: Not hard at all  Food Insecurity: No Food Insecurity (01/27/2023)   Hunger Vital Sign    Worried About Running Out of Food in the Last Year: Never true    Ran Out of Food in the Last Year: Never true  Transportation Needs: No Transportation Needs (01/27/2023)   PRAPARE - Administrator, Civil Service (Medical): No    Lack of Transportation (Non-Medical): No  Physical Activity: Insufficiently Active (01/27/2023)   Exercise Vital Sign    Days of Exercise per Week: 2 days    Minutes of Exercise per Session: 60 min  Stress: Stress Concern Present (01/27/2023)   [employer] of Occupational Health - Occupational Stress Questionnaire    Feeling of Stress : To some extent  Social Connections: Socially Integrated (01/27/2023)   Social Connection and Isolation Panel [NHANES]    Frequency of Communication with Friends and Family: Three times a week    Frequency of Social Gatherings with Friends and Family: Once a week    Attends Religious Services: More than 4 times per year    Active Member of [employer] or Organizations: Yes    Attends Engineer, structural: More than 4 times  per year    Marital Status: Married  Catering manager Violence: Not on file    Family History  Problem Relation Age of Onset   Hypertension Mother    Diabetes  Mother    Diverticulitis Mother    Heart failure Mother    Hyperlipidemia Father    Diabetes Father    Heart attack Father    Arrhythmia Father    Sleep apnea Father    Heart attack Sister    Obesity Sister    Arthritis Sister    Asthma Sister    Alcohol abuse Brother    Stroke Maternal Uncle    Pancreatic cancer Maternal Uncle    Leukemia Maternal Grandmother    Diabetes Maternal Grandfather    Diabetes Paternal Grandmother    Heart attack Paternal Grandfather    Heart disease Paternal Grandfather    Migraines Daughter    Liver disease Other        Paternal great aunt   Breast cancer Neg Hx    Colon cancer Neg Hx    Esophageal cancer Neg Hx    Inflammatory bowel disease Neg Hx    Rectal cancer Neg Hx    Stomach cancer Neg Hx     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   DDD (degenerative disc disease), cervical    Depression    Dorsalgia    Glaucoma    Hypercholesterolemia    Intestinal gas excretion    Memory loss 01/13/2020   Migraine 08/17/2020   Nonallopathic lesion of cervical region 01/23/2021   l areas are chronic   Patient tolerated the procedure well with improvement in symptoms  Patient given exercises, stretches and lifestyle modifications  See medications in patient instructions if given  Patient will follow up in 4-8 weeks   Peptic ulcer    Scapular dyskinesis 01/23/2021   Scarlet fever    Sequelae of other specified infectious and parasitic diseases 11/20/2021   Skin cancer    Tick bite 02/22/2018   UTI (urinary tract infection)    Yeast infection     Past  Surgical History:  Procedure Laterality Date   BASAL CELL CARCINOMA EXCISION     BREAST BIOPSY Left 1992   Duct removal (No scar seen)    BREAST EXCISIONAL BIOPSY Left 1992   COLONOSCOPY  07/2021   DACRORHINOCYSTOTOMY  10/2020   LEFT HEART CATH AND CORONARY ANGIOGRAPHY N/A 01/16/2018   Procedure: LEFT HEART CATH AND CORONARY ANGIOGRAPHY;  Surgeon: Yvonne Kendall, MD;  Location: [facility];  Service: Cardiovascular;  Laterality: N/A;   SHOULDER ARTHROSCOPY WITH ROTATOR CUFF REPAIR AND SUBACROMIAL DECOMPRESSION Left 11/12/2022   Procedure: SHOULDER ARTHROSCOPY WITH ROTATOR CUFF DEBBRIDEMENT AND SUBACROMIAL DECOMPRESSION;  Surgeon: Jones Broom, MD;  Location: [facility];  Service: Orthopedics;  Laterality: Left;   UPPER GASTROINTESTINAL ENDOSCOPY  07/2021    Current Outpatient Medications  Medication Sig Dispense Refill   aspirin EC 81 MG tablet Take 1 tablet by mouth every other day.     Cholecalciferol (VITAMIN D3) 25 MCG/SPRAY LIQD Take 250 mcg by mouth daily.     FLUoxetine (PROZAC) 10 MG capsule Take 1 capsule (10mg  total) by mouth every other day alternating with 2 capsules (20mg  total) by mouth every other day. 135 capsule 3   hydrOXYzine (ATARAX) 10 MG tablet Take 1-2 tablets (10-20 mg total) by mouth 3 (three) times daily (morning, noon and bedtime) as  needed for itching. 30 tablet 1   progesterone (PROMETRIUM) 100 MG capsule Take 1 capsule (100 mg total) by mouth at bedtime. Increase to 2 capsules (200 mg total) at bedtime prior to cycle. 120 capsule 3   No current facility-administered medications for this visit.    Allergies as of 05/05/2023 - Review Complete 05/05/2023  Allergen Reaction Noted   Lipitor [atorvastatin calcium] Other (See Comments) 01/14/2018   Celebrex [celecoxib] Rash 12/05/2015    Vitals: BP 95/63   Pulse 67   Ht 5\' 2"  (1.575 m)   Wt 136 lb (61.7 kg)   LMP 11/29/2021   BMI 24.87 kg/m  Last Weight:  Wt Readings from Last  1 Encounters:  05/05/23 136 lb (61.7 kg)   Last Height:   Ht Readings from Last 1 Encounters:  05/05/23 5\' 2"  (1.575 m)  Exam: NAD, pleasant                  Speech:    Speech is normal; fluent and spontaneous with normal comprehension.  Cognition:    The patient is oriented to person, place, and time;     recent and remote memory intact;     language fluent;    Cranial Nerves:    The pupils are equal, round, and reactive to light.Trigeminal sensation is intact and the muscles of mastication are normal. The face is symmetric. The palate elevates in the midline. Hearing intact. Voice is normal. Shoulder shrug is normal. The tongue has normal motion without fasciculations.   Coordination:  No dysmetria  Motor Observation:    No asymmetry, no atrophy, and no involuntary movements noted. Tone:    Normal muscle tone.     Strength:    Strength is V/V in the upper and lower limbs.      Sensation: intact to LT         Assessment/Plan:  She is here today for headaches and radiating pain fom temple to eyebrow per Dr. Claiborne Billings. We saw patient in 2019 for multiple complaints and pain, we performed an extensive blood work investigation and MRI brain and cervical spine. Labs were all normal/negative including lyme's disease and investigations below. Since then she has had multiple other bloodwork including repeats of some of those below. 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 She saw a [facility] doctor and he treated with 2 weeks of doxy   I do think the pain in the temple area is most closely resembles trigeminal neuralgia and she Her brain MRI does show a prominent vessel abutting her left trigeminal nerve in the prepontine cistern. She has seen Dr. Tempie Donning at this time will try medical management. Can try Tegretol or a higher dose of Topiramate (only tried 25mg ).  Could also  increase her gabapentin. She is just taking gabapentin at bedtime 500-600 at bedtime.  - MRI brain in 2019 showed mildly descended tonsiles, rounded, not crowding the cervical junction, likely not clinically synptomatic.  - Repeat MRI Trigeminal protocol - showed Her brain MRI does show a prominent vessel abutting her left trigeminal nerve in the prepontine cistern. She has decided not to have treatment saw Dr. Tempie Donning and [facility] - TGN: Samples of emgality did not help, other medications tried include verapamil (blood pressure too low), baclofen(can cause sedation), Topamax(prescribe low dose only 25mg  never increased), on gabapentin not helping at current dose,  tried amitriptyline (severe sedation)  TGN and Occipital Neuralgia and cervical myofascial pain: Discussed  [email or web address]  [email or web address] PT and dry needling to [place] Lidoderm patches Dry needling Massage Chiropractic - [place] chiro, Riki Rusk at [facility], Dr Fuller Plan Botox for "cervical dystonia" Could do nerve blocks  Brought her to infusion center and gave infusion, she felt much better today aferwards     PRIOR ASSESSMENTS BELOW 2019:   Orders Placed last encounter in 2019 were all unremarkable/neg or normal except for mild cerebellar ectopia.   Procedures   MR FACE/TRIGEMINAL WO/W CM   MR BRAIN W WO CONTRAST   TSH   Sedimentation rate   B12 and Folate Panel   Tissue transglutaminase, IgA   Gliadin antibodies, serum   Heavy metals, blood   Vitamin B6   Methylmalonic acid, serum   Vitamin B1   ANA, IFA (with reflex)   C-reactive protein   Basic Metabolic Panel   B. burgdorfi Antibody    MRI 06-11-2022 showed possible left C5 radiculopathy:  Disc levels:   C2-C3:  No stenosis.   C3-C4:  No stenosis.  Trace central protrusion.  No stenosis.   C4-C5: Left paracentral protrusion partially effaces the left ventral subarachnoid space. No significant canal or foraminal stenosis.    C5-C6:  Minimal disc bulge.  No canal or foraminal stenosis.   C6-C7:  No stenosis.   C7-T1:  No stenosis.   IMPRESSION: No high-grade stenosis. Left paracentral disc protrusion at C4-C5; correlate for C5 radicular symptoms.  2019: MRI face/trigem nerve:  FINDINGS: On the sagittal images, the cerebellar tonsils extend just below the foramen magnum but not enough to be considered a Chiari malformation.  The spinal cord is imaged quarterly to C4 and is normal.  The pituitary gland and the optic chiasm appear normal.  The cerebellum, brainstem and visible portion of the brain appears normal.  The trigeminal nerves are followed from the pons into Meckel's caves and appear normal.  A blood vessel is adjacent to the left trigeminal nerve dorsal root entry zone but it does not distort the nerve.    There is minimal mucoperiosteal thickening of some of the ethmoid air cells and the other paranasal sinuses appear normal.  The 7th/8th nerve complexes appear normal.  The orbits appear normal.    Incidental note is made of asymmetry of the transverse and sigmoid sinuses and jugular veins, larger on the right than the left.  This is a normal variant.  Flow voids are noted in the major intracerebral arteries.    IMPRESSION: This MRI of the face and trigeminal nerve region appears normal.  There is a blood vessel adjacent to the dorsal root entry zone of the left trigeminal nerve but it does not cause any distortion. FINDINGS:  The brain parenchyma shows no abnormal signal intensities.  No structural lesion, tumor or infarcts are noted.  The paranasal sinuses show mild chronic inflammatory changes.  The pituitary gland  appears normal.  The cerebellar tonsils are 4 to 5 mm below the foramen magnum for borderline type I Arnold-Chiari malformation with some crowding of the craniovertebral junction.  Extra-axial brain structures appear normal.  Orbits appear unremarkable paranasal sinuses show mild chronic  inflammatory changes.  The subarachnoid spaces and ventricular system appear normal.  Diffusion-weighted imaging is negative for acute ischemia.  Gradient echo images do not show significant microhemorrhages.  Thin sections through the brainstem showed normal symmetric appearance of both trigeminal nerves without any abnormal vascular loop or masses noted.  Postcontrast images do not result in abnormal areas of enhancement.      2019: MRI brain : FINDINGS:  The brain parenchyma shows no abnormal signal intensities.  No structural lesion, tumor or infarcts are noted.  The paranasal sinuses show mild chronic inflammatory changes.  The pituitary gland  appears normal.  The cerebellar tonsils are 4 to 5 mm below the foramen magnum for borderline type I Arnold-Chiari malformation with some crowding of the craniovertebral junction.  Extra-axial brain structures appear normal.  Orbits appear unremarkable paranasal sinuses show mild chronic inflammatory changes.  The subarachnoid spaces and ventricular system appear normal.  Diffusion-weighted imaging is negative for acute ischemia.  Gradient echo images do not show significant microhemorrhages.  Thin sections through the brainstem showed normal symmetric appearance of both trigeminal nerves without any abnormal vascular loop or masses noted.  Postcontrast images do not result in abnormal areas of enhancement.         IMPRESSION: Unremarkable MRI scan of the brain with and without contrast.  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  No orders of the defined types were placed in this encounter.  Cc: Dr. Tiburcio Pea  I spent over 40 minutes of face-to-face and non-face-to-face time with patient on the  1. Status migrainosus   2. Occipital neuralgia of left side   3. Trigeminal neuralgia of left side of face   4. Cervical myofascial pain syndrome      diagnosis.  This included previsit  chart review, lab review, study review, order entry, electronic health record documentation, patient education on the different diagnostic and therapeutic options, counseling and coordination of care, risks and benefits of management, compliance, or risk factor reduction    Michele Dean, MD  [facility] [address] [place], [place] [address]  Phone [phone] Fax [phone]

## 2023-05-06 ENCOUNTER — Encounter (HOSPITAL_BASED_OUTPATIENT_CLINIC_OR_DEPARTMENT_OTHER): Payer: Self-pay | Admitting: *Deleted

## 2023-05-06 ENCOUNTER — Emergency Department (HOSPITAL_BASED_OUTPATIENT_CLINIC_OR_DEPARTMENT_OTHER): Payer: 59

## 2023-05-06 ENCOUNTER — Emergency Department (HOSPITAL_BASED_OUTPATIENT_CLINIC_OR_DEPARTMENT_OTHER)
Admission: EM | Admit: 2023-05-06 | Discharge: 2023-05-06 | Disposition: A | Discharge status: Home / Self Care | Payer: 59 | Attending: Emergency Medicine | Admitting: Emergency Medicine

## 2023-05-06 ENCOUNTER — Other Ambulatory Visit: Payer: Self-pay

## 2023-05-06 DIAGNOSIS — Z7982 Long term (current) use of aspirin: Secondary | ICD-10-CM | POA: Diagnosis not present

## 2023-05-06 DIAGNOSIS — D72829 Elevated white blood cell count, unspecified: Secondary | ICD-10-CM | POA: Insufficient documentation

## 2023-05-06 DIAGNOSIS — R1013 Epigastric pain: Secondary | ICD-10-CM | POA: Diagnosis not present

## 2023-05-06 DIAGNOSIS — K5900 Constipation, unspecified: Secondary | ICD-10-CM

## 2023-05-06 DIAGNOSIS — K6389 Other specified diseases of intestine: Secondary | ICD-10-CM | POA: Diagnosis not present

## 2023-05-06 DIAGNOSIS — R1010 Upper abdominal pain, unspecified: Secondary | ICD-10-CM | POA: Diagnosis not present

## 2023-05-06 DIAGNOSIS — R188 Other ascites: Secondary | ICD-10-CM | POA: Diagnosis not present

## 2023-05-06 DIAGNOSIS — R140 Abdominal distension (gaseous): Secondary | ICD-10-CM | POA: Diagnosis not present

## 2023-05-06 LAB — COMPREHENSIVE METABOLIC PANEL
ALT: 9 U/L (ref 0–44)
AST: 16 U/L (ref 15–41)
Albumin: 4.2 g/dL (ref 3.5–5.0)
Alkaline Phosphatase: 53 U/L (ref 38–126)
Anion gap: 9 (ref 5–15)
BUN: 13 mg/dL (ref 6–20)
CO2: 22 mmol/L (ref 22–32)
Calcium: 9.5 mg/dL (ref 8.9–10.3)
Chloride: 100 mmol/L (ref 98–111)
Creatinine, Ser: 0.8 mg/dL (ref 0.44–1.00)
GFR, Estimated: >60 mL/min (ref >60)
Glucose, Bld: 132 mg/dL — ABNORMAL HIGH (ref 70–99)
Potassium: 4.2 mmol/L (ref 3.5–5.1)
Sodium: 131 mmol/L — ABNORMAL LOW (ref 135–145)
Total Bilirubin: 0.6 mg/dL (ref 0.3–1.2)
Total Protein: 6.8 g/dL (ref 6.5–8.1)

## 2023-05-06 LAB — CBC
HCT: 38.1 % (ref 36.0–46.0)
Hemoglobin: 13.2 g/dL (ref 12.0–15.0)
MCH: 31.7 pg (ref 26.0–34.0)
MCHC: 34.6 g/dL (ref 30.0–36.0)
MCV: 91.4 fL (ref 80.0–100.0)
Platelets: 304 10*3/uL (ref 150–400)
RBC: 4.17 MIL/uL (ref 3.87–5.11)
RDW: 14.9 % (ref 11.5–15.5)
WBC: 11.7 10*3/uL — ABNORMAL HIGH (ref 4.0–10.5)
nRBC: 0 % (ref 0.0–0.2)

## 2023-05-06 LAB — LIPASE, BLOOD: Lipase: 23 U/L (ref 11–51)

## 2023-05-06 MED ORDER — IOHEXOL 300 MG/ML  SOLN
100.0000 mL | Freq: Once | INTRAMUSCULAR | Status: AC | PRN
  Administered 2023-05-06: 80 mL via INTRAVENOUS

## 2023-05-06 MED ORDER — MORPHINE SULFATE (PF) 4 MG/ML IV SOLN
4.0000 mg | Freq: Once | INTRAVENOUS | Status: AC
  Administered 2023-05-06: 4 mg via INTRAVENOUS
  Filled 2023-05-06: qty 1

## 2023-05-06 MED ORDER — ONDANSETRON HCL 4 MG/2ML IJ SOLN
4.0000 mg | Freq: Once | INTRAMUSCULAR | Status: AC
  Administered 2023-05-06: 4 mg via INTRAVENOUS
  Filled 2023-05-06: qty 2

## 2023-05-06 MED ORDER — SODIUM CHLORIDE 0.9 % IV BOLUS
1000.0000 mL | Freq: Once | INTRAVENOUS | Status: AC
  Administered 2023-05-06: 1000 mL via INTRAVENOUS

## 2023-05-06 NOTE — ED Provider Notes (Signed)
[facility] Provider Note   CSN: [id number] Arrival date & time: 05/06/23  4098     History  Chief Complaint  Patient presents with   Abdominal Pain    KASSIDIE HENDRIKS is a 57 y.o. female.  Patient is a 57 year old female with past medical history of hyperlipidemia, peptic ulcer, and recent visit to urgent care for abdominal pain.  Patient presenting today with complaints of epigastric pain.  This started yesterday evening and has worsened throughout the night.  She describes a crampy pain to her upper abdomen that is constant.  It is not associated with any nausea or vomiting.  She denies any diarrhea or constipation.  No fevers or chills.  She was seen 1 month ago with similar complaints at urgent care.  She had x-rays that were concerning for constipation.  She was given laxatives and this seemed to help.  She improved, but now pain has returned.  The history is provided by the patient.       Home Medications Prior to Admission medications   Medication Sig Start Date End Date Taking? Authorizing Provider  aspirin EC 81 MG tablet Take 1 tablet by mouth every other day.    [provider]  Cholecalciferol (VITAMIN D3) 25 MCG/SPRAY LIQD Take 250 mcg by mouth daily.    [provider]  FLUoxetine (PROZAC) 10 MG capsule Take 1 capsule (10mg  total) by mouth every other day alternating with 2 capsules (20mg  total) by mouth every other day. 10/28/22   [name], [name], DO  hydrOXYzine (ATARAX) 10 MG tablet Take 1-2 tablets (10-20 mg total) by mouth 3 (three) times daily (morning, noon and bedtime) as needed for itching. 02/04/23     progesterone (PROMETRIUM) 100 MG capsule Take 1 capsule (100 mg total) by mouth at bedtime. Increase to 2 capsules (200 mg total) at bedtime prior to cycle. 11/28/22         Allergies    Lipitor [atorvastatin calcium] and Celebrex [celecoxib]    Review of Systems   Review of Systems  All other  systems reviewed and are negative.   Physical Exam Updated Vital Signs BP 123/80   Pulse 70   Temp 98 F (36.7 C) (Oral)   Resp 16   LMP 11/29/2021   SpO2 95%  Physical Exam Vitals and nursing note reviewed.  Constitutional:      General: She is not in acute distress.    Appearance: She is well-developed. She is not diaphoretic.  HENT:     Head: Normocephalic and atraumatic.  Cardiovascular:     Rate and Rhythm: Normal rate and regular rhythm.     Heart sounds: No murmur heard.    No friction rub. No gallop.  Pulmonary:     Effort: Pulmonary effort is normal. No respiratory distress.     Breath sounds: Normal breath sounds. No wheezing.  Abdominal:     General: Bowel sounds are normal. There is no distension.     Palpations: Abdomen is soft.     Tenderness: There is abdominal tenderness in the epigastric area. There is no right CVA tenderness, left CVA tenderness, guarding or rebound.  Musculoskeletal:        General: Normal range of motion.     Cervical back: Normal range of motion and neck supple.  Skin:    General: Skin is warm and dry.  Neurological:     General: No focal deficit present.     Mental  Status: She is alert and oriented to person, place, and time.     ED Results / Procedures / Treatments   Labs (all labs ordered are listed, but only abnormal results are displayed) Labs Reviewed  CBC - Abnormal; Notable for the following components:      Result Value   WBC 11.7 (*)    All other components within normal limits  LIPASE, BLOOD  COMPREHENSIVE METABOLIC PANEL  URINALYSIS, ROUTINE W REFLEX MICROSCOPIC    EKG None  Radiology No results found.  Procedures Procedures    Medications Ordered in ED Medications  ondansetron (ZOFRAN) injection 4 mg (has no administration in time range)  sodium chloride 0.9 % bolus 1,000 mL (has no administration in time range)  morphine (PF) 4 MG/ML injection 4 mg (has no administration in time range)    ED  Course/ Medical Decision Making/ A&P  Patient is a 58 year old female presenting with complaints of abdominal pain, the details of which are described in the HPI.  Patient arrives here with stable vital signs and is afebrile.  She is uncomfortable appearing, but abdominal exam is basically benign.  Laboratory studies obtained including CBC, CMP, and lipase.  She has a mild leukocytosis of 11,000, but studies otherwise unremarkable.  CT scan of the abdomen and pelvis obtained showing moderate to large stool volume and mild distention of a fluid-filled duodenum and proximal small intestine, possibly related to an ileus.  Patient was given IV fluids along with morphine for pain and Zofran for nausea.  She continues to be in some discomfort.  At this point, I feel as though additional pain medication is counterproductive and may worsen her constipation.  Patient in agreement to go home and try magnesium citrate to see if this relieves her constipation.  To return as needed for any problems.  Final Clinical Impression(s) / ED Diagnoses Final diagnoses:  None    Rx / DC Orders ED Discharge Orders     None         Geoffery Lyons, MD 05/06/23 [phone]

## 2023-05-06 NOTE — ED Notes (Signed)
All appropriate discharge materials reviewed at length with patient. Time for questions provided. Pt has no other questions at this time and verbalizes understanding of all provided materials.  

## 2023-05-06 NOTE — Discharge Instructions (Signed)
Magnesium citrate: Drink an entire 10 ounce bottle for relief of constipation.  Return to the ER if you develop worsening pain, high fevers, bloody stools, or for other new and concerning symptoms.

## 2023-05-06 NOTE — ED Triage Notes (Signed)
Pt c/o upper abd pain since 6pm last night with sudden onset. Denies  NV. Reports similar episode 4 weeks ago with concerns for small bowel obstruction. Described as waves of intensity

## 2023-05-07 ENCOUNTER — Other Ambulatory Visit (HOSPITAL_BASED_OUTPATIENT_CLINIC_OR_DEPARTMENT_OTHER): Payer: Self-pay

## 2023-05-08 ENCOUNTER — Other Ambulatory Visit: Payer: Self-pay

## 2023-05-08 ENCOUNTER — Other Ambulatory Visit (HOSPITAL_BASED_OUTPATIENT_CLINIC_OR_DEPARTMENT_OTHER): Payer: Self-pay

## 2023-05-08 MED ORDER — HYDROXYZINE HCL 10 MG PO TABS
10.0000 mg | ORAL_TABLET | Freq: Three times a day (TID) | ORAL | 1 refills | Status: DC | PRN
  Filled 2023-05-08: qty 30, 5d supply, fill #0
  Filled 2023-06-24: qty 30, 5d supply, fill #1

## 2023-05-16 ENCOUNTER — Other Ambulatory Visit: Payer: Self-pay

## 2023-05-16 ENCOUNTER — Encounter: Payer: Self-pay | Admitting: Family Medicine

## 2023-05-16 ENCOUNTER — Other Ambulatory Visit (HOSPITAL_BASED_OUTPATIENT_CLINIC_OR_DEPARTMENT_OTHER): Payer: Self-pay

## 2023-05-16 NOTE — Telephone Encounter (Signed)
Pt made aware and scheduled for Monday, 7/29 to discuss.

## 2023-05-16 NOTE — Telephone Encounter (Signed)
If patient desires to have a cardiac CT completed, please have her schedule an appointment with his provider at her earliest convenience so that we may discuss and order for her. -If she does not want to come in for an appointment to discuss the screening process, then we can discuss at her next routine follow-up appointment

## 2023-05-19 ENCOUNTER — Ambulatory Visit (INDEPENDENT_AMBULATORY_CARE_PROVIDER_SITE_OTHER): Payer: 59 | Admitting: Family Medicine

## 2023-05-19 ENCOUNTER — Encounter: Payer: Self-pay | Admitting: Family Medicine

## 2023-05-19 VITALS — BP 112/75 | HR 58 | Temp 98.0°F | Wt 133.2 lb

## 2023-05-19 DIAGNOSIS — Z789 Other specified health status: Secondary | ICD-10-CM | POA: Diagnosis not present

## 2023-05-19 DIAGNOSIS — Z8249 Family history of ischemic heart disease and other diseases of the circulatory system: Secondary | ICD-10-CM

## 2023-05-19 DIAGNOSIS — E782 Mixed hyperlipidemia: Secondary | ICD-10-CM

## 2023-05-19 NOTE — Progress Notes (Signed)
Michele [name] , 1966-03-26, 57 y.o., female MRN: [id number] Patient Care Team    Relationship Specialty Notifications Start End  Natalia Leatherwood, DO PCP - General Family Medicine  12/07/21   Marcelle Overlie, MD Consulting Physician Obstetrics and Gynecology  12/07/21   Mansouraty, Netty Starring., MD Consulting Physician Gastroenterology  12/07/21   Caro Laroche, MD Referring Physician Ophthalmology  12/07/21   Anson Fret, MD Consulting Physician Neurology  12/07/21   Manson Passey, PA (Inactive) Physician Assistant Cardiology  12/07/21     Chief Complaint  Patient presents with   F/H of MI     Subjective: Michele [name] is a 57 y.o. Pt presents for discuss concerning her hyperlipidemia history, statin intolerance and strong fx of heart disease. She has expressed interest in obtaining a cardiac CT to help risk stratify her     01/27/2023    9:14 AM 10/28/2022    8:51 AM 12/07/2021   10:11 AM 01/12/2020    4:11 PM  Depression screen PHQ 2/9  Decreased Interest 0 0 0 3  Down, Depressed, Hopeless 0 0 0 2  PHQ - 2 Score 0 0 0 5    Allergies  Allergen Reactions   Lipitor [Atorvastatin Calcium] Other (See Comments)    Memory issue; myalgia   Celebrex [Celecoxib] Rash   Social History   Social History Narrative   Marital status/children/pets: 2 children.    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   DDD (degenerative disc disease), cervical    Depression    Dorsalgia    Glaucoma    Hypercholesterolemia    Intestinal gas excretion     Memory loss 01/13/2020   Migraine 08/17/2020   Nonallopathic lesion of cervical region 01/23/2021   l areas are chronic   Patient tolerated the procedure well with improvement in symptoms  Patient given exercises, stretches and lifestyle modifications  See medications in patient instructions if given  Patient will follow up in 4-8 weeks   Peptic ulcer    Scapular dyskinesis 01/23/2021   Scarlet fever    Sequelae of other specified infectious and parasitic diseases 11/20/2021   Skin cancer    Tick bite 02/22/2018   UTI (urinary tract infection)    Yeast infection    Past Surgical History:  Procedure Laterality Date   BASAL CELL CARCINOMA EXCISION     BREAST BIOPSY Left 1992   Duct removal (No scar seen)    BREAST EXCISIONAL BIOPSY Left 1992   COLONOSCOPY  07/2021   DACRORHINOCYSTOTOMY  10/2020   LEFT HEART CATH AND CORONARY ANGIOGRAPHY N/A 01/16/2018   Procedure: LEFT HEART CATH AND CORONARY ANGIOGRAPHY;  Surgeon: Yvonne Kendall, MD;  Location: [facility];  Service: Cardiovascular;  Laterality: N/A;   SHOULDER ARTHROSCOPY WITH ROTATOR CUFF REPAIR AND SUBACROMIAL DECOMPRESSION Left 11/12/2022   Procedure: SHOULDER ARTHROSCOPY WITH ROTATOR CUFF DEBBRIDEMENT AND SUBACROMIAL DECOMPRESSION;  Surgeon: Jones Broom, MD;  Location: [facility];  Service: Orthopedics;  Laterality: Left;   UPPER GASTROINTESTINAL ENDOSCOPY  07/2021   Family History  Problem Relation Age of Onset   Hypertension Mother    Diabetes Mother    Diverticulitis Mother    Heart failure Mother    Hyperlipidemia Father    Diabetes Father    Heart attack Father    Arrhythmia Father    Sleep apnea Father    Heart attack Sister    Obesity Sister    Arthritis Sister    Asthma Sister    Alcohol abuse Brother    Stroke Maternal Uncle    Pancreatic cancer Maternal Uncle    Leukemia Maternal Grandmother    Diabetes Maternal Grandfather    Diabetes Paternal Grandmother    Heart attack  Paternal Grandfather    Heart disease Paternal Grandfather    Migraines Daughter    Liver disease Other        Paternal great aunt   Breast cancer Neg Hx    Colon cancer Neg Hx    Esophageal cancer Neg Hx    Inflammatory bowel disease Neg Hx    Rectal cancer Neg Hx    Stomach cancer Neg Hx    Allergies as of 05/19/2023       Reactions   Lipitor [atorvastatin Calcium] Other (See Comments)   Memory issue; myalgia   Celebrex [celecoxib] Rash        Medication List        Accurate as of May 19, 2023  9:57 AM. If you have any questions, ask your nurse or doctor.          aspirin EC 81 MG tablet Take 1 tablet by mouth every other day.   FLUoxetine 10 MG capsule Commonly known as: PROZAC Take 1 capsule (10mg  total) by mouth every other day alternating with 2 capsules (20mg  total) by mouth every other day.   gabapentin 100 MG capsule Commonly known as: NEURONTIN Take 2 capsules (200 mg total) by mouth 2 (two) times daily.   hydrOXYzine 10 MG tablet Commonly known as: ATARAX Take 1-2 tablets (10-20 mg total) by mouth 3 (three) times daily as needed for itching.   progesterone 100 MG capsule Commonly known as: PROMETRIUM Take 1 capsule (100 mg total) by mouth at bedtime. Increase to 2 capsules (200 mg total) at bedtime prior to cycle.   Vitamin D3 25 MCG/SPRAY Liqd Take 250 mcg by mouth daily.        All past medical history, surgical history, allergies, family history, immunizations andmedications were updated in the EMR today and reviewed under the history and medication portions of their EMR.     ROS Negative, with the exception of above mentioned in HPI   Objective:  BP 112/75   Pulse (!) 58   Temp 98 F (36.7 C)   Wt 133 lb 3.2 oz (60.4 kg)   LMP 11/29/2021   SpO2 98%   BMI 24.36 kg/m  Body mass index is 24.36 kg/m. Physical Exam Vitals and nursing note reviewed.  Constitutional:      General: She is not in acute distress.    Appearance:  Normal appearance. She is not ill-appearing, toxic-appearing or diaphoretic.  HENT:     Head: Normocephalic and atraumatic.  Eyes:     General: No scleral icterus.       Right eye: No discharge.        Left eye: No  discharge.     Extraocular Movements: Extraocular movements intact.     Conjunctiva/sclera: Conjunctivae normal.     Pupils: Pupils are equal, round, and reactive to light.  Cardiovascular:     Rate and Rhythm: Normal rate and regular rhythm.  Pulmonary:     Effort: Pulmonary effort is normal. No respiratory distress.     Breath sounds: Normal breath sounds. No wheezing, rhonchi or rales.  Musculoskeletal:     Right lower leg: No edema.     Left lower leg: No edema.  Skin:    General: Skin is warm.     Findings: No rash.  Neurological:     Mental Status: She is alert and oriented to person, place, and time. Mental status is at baseline.     Motor: No weakness.     Gait: Gait normal.  Psychiatric:        Mood and Affect: Mood normal.        Behavior: Behavior normal.        Thought Content: Thought content normal.        Judgment: Judgment normal.      No results found. No results found. No results found for this or any previous visit (from the past 24 hour(s)).  Assessment/Plan: Michele [name] is a 57 y.o. female present for OV for  Mixed hyperlipidemia/FHx: heart disease Statin intolerant  Educated pt on cardiac scorings today. She understands this is not covered by insurance.  - CT CARDIAC SCORING (SELF PAY ONLY); Future Pt will be called with results and tx plan discussed.   Reviewed expectations re: course of current medical issues. Discussed self-management of symptoms. Outlined signs and symptoms indicating need for more acute intervention. Patient verbalized understanding and all questions were answered. Patient received an After-Visit Summary.    Orders Placed This Encounter  Procedures   CT CARDIAC SCORING (SELF PAY ONLY)   No orders of  the defined types were placed in this encounter.  Referral Orders  No referral(s) requested today     Note is dictated utilizing voice recognition software. Although note has been proof read prior to signing, occasional typographical errors still can be missed. If any questions arise, please do not hesitate to call for verification.   electronically signed by:  Felix Pacini, DO  [facility] [place]

## 2023-05-19 NOTE — Patient Instructions (Addendum)

## 2023-05-22 ENCOUNTER — Other Ambulatory Visit: Payer: Self-pay

## 2023-05-22 DIAGNOSIS — M545 Low back pain, unspecified: Secondary | ICD-10-CM

## 2023-06-03 ENCOUNTER — Ambulatory Visit (INDEPENDENT_AMBULATORY_CARE_PROVIDER_SITE_OTHER): Payer: 59 | Admitting: Neurology

## 2023-06-03 DIAGNOSIS — R0683 Snoring: Secondary | ICD-10-CM

## 2023-06-03 DIAGNOSIS — G4719 Other hypersomnia: Secondary | ICD-10-CM

## 2023-06-03 DIAGNOSIS — R519 Headache, unspecified: Secondary | ICD-10-CM

## 2023-06-03 DIAGNOSIS — G4720 Circadian rhythm sleep disorder, unspecified type: Secondary | ICD-10-CM

## 2023-06-03 DIAGNOSIS — R351 Nocturia: Secondary | ICD-10-CM

## 2023-06-03 DIAGNOSIS — Z820 Family history of epilepsy and other diseases of the nervous system: Secondary | ICD-10-CM

## 2023-06-03 DIAGNOSIS — R0681 Apnea, not elsewhere classified: Secondary | ICD-10-CM

## 2023-06-05 ENCOUNTER — Ambulatory Visit
Admission: RE | Admit: 2023-06-05 | Discharge: 2023-06-05 | Disposition: A | Discharge status: Home / Self Care | Payer: 59 | Source: Ambulatory Visit | Attending: Obstetrics and Gynecology | Admitting: Obstetrics and Gynecology

## 2023-06-05 DIAGNOSIS — Z1231 Encounter for screening mammogram for malignant neoplasm of breast: Secondary | ICD-10-CM

## 2023-06-13 NOTE — Procedures (Signed)
Physician Interpretation:     [facility] POLYSOMNOGRAPHY  INTERPRETATION REPORT   STUDY DATE:  06/03/2023     PATIENT NAME:  Michele [name]         DATE OF BIRTH:  [birth year]  PATIENT ID:  [id number]    TYPE OF STUDY:  PSG  READING PHYSICIAN: Huston Foley, MD, PhD   SCORING TECHNICIAN: Margaretann Loveless, RPSGT     Referred by: Natalia Leatherwood, DO  ? History and Indication for Testing: 57 year old female with an underlying medical history of arthritis, status post rotator cuff repair on the L, glaucoma, hyperlipidemia, migraine headaches, anemia, anxiety, depression, and borderline overweight state, who reports worsening daytime somnolence and snoring. Height: 62 in Weight: 137 lb (BMI 25) Neck Size: 14 in    MEDICATIONS: Aspirin, Vitamin D3, Flexeril, Hydroxyzine, Polysaccharide Iron, Prometrium, Cymbalta, Prozac   TECHNICAL DESCRIPTION: A registered sleep technologist was in attendance for the duration of the recording.  Data collection, scoring, video monitoring, and reporting were performed in compliance with the AASM Manual for the Scoring of Sleep and Associated Events; (Hypopnea is scored based on the criteria listed in Section VIII D. 1b in the AASM Manual V2.6 using a 4% oxygen desaturation rule or Hypopnea is scored based on the criteria listed in Section VIII D. 1a in the AASM Manual V2.6 using 3% oxygen desaturation and /or arousal rule).   SLEEP CONTINUITY AND SLEEP ARCHITECTURE:  Lights-out was at 21:00: and lights-on at  05:04:, with a total recording time of 8 hours, 4.5 minutes. Total sleep time (TST) was 418.5 minutes with a normal sleep efficiency at 86.4%. There was  26.9% REM sleep.    BODY POSITION:  TST was divided  between the following sleep positions: 13.1% supine;  86.9% lateral;  0% prone. Duration of total sleep and percent of total sleep in their respective position is as follows: supine 55 minutes (13%), non-supine 364 minutes  (87%); right 156 minutes (37%), left 207 minutes (50%), and prone 00 minutes (0%).  Total supine REM sleep time was 00 minutes (0% of total REM sleep). Sleep latency was normal at 23.0 minutes.  REM sleep latency was increased at 215.5 minutes. Of the total sleep time, the percentage of stage N1 sleep was 1.3%, stage N2 sleep was 61%, which is increased, stage N3 sleep was 10.4%, which is mildly reduced, and REM sleep was 26.9%, which is minimally increased. Wake after sleep onset (WASO) time accounted for 43 minutes, with one longer period of wakefulness, but otherwise no significant sleep fragmentation.   RESPIRATORY MONITORING:    Based on CMS criteria (using a 4% oxygen desaturation rule for scoring hypopneas), there were 1 apneas (1 obstructive; 0 central; 0 mixed), and 3 hypopneas.  Apnea index was 0.1. Hypopnea index was 0.4. The apnea-hypopnea index was 0.6 overall (1.1 supine, 1 non-supine; 1.1 REM, 0.0 supine REM). There were 0 respiratory effort-related arousals (RERAs).  The RERA index was 0 events/h. Total respiratory disturbance index (RDI) was 0.6 events/h. RDI results showed: supine RDI  1.1 /h; non-supine RDI 0.5 /h; REM RDI 1.1 /h, supine REM RDI 0.0 /h.   Based on AASM criteria (using a 3% oxygen desaturation and /or arousal rule for scoring hypopneas), there were 1 apneas (1 obstructive; 0 central; 0 mixed), and 4 hypopneas. Apnea index was 0.1. Hypopnea index was 0.6. The apnea-hypopnea index was 0.7/hour overall (1.1 supine, 1 non-supine; 1.1 REM, 0.0 supine REM). There were 0 respiratory effort-related arousals (  RERAs).  The RERA index was 0 events/h. Total respiratory disturbance index (RDI) was 0.7 events/h. RDI results showed: supine RDI  1.1 /h; non-supine RDI 0.7 /h; REM RDI 1.1 /h, supine REM RDI 0.0 /h.    OXIMETRY: Oxyhemoglobin Saturation Nadir during sleep was at  87% from a mean of 95%.  Of the Total sleep time (TST)   hypoxemia (=<88%) was present for  0.4 minutes, or  0.1% of total sleep time.    LIMB MOVEMENTS: There were 0 periodic limb movements of sleep (0.0/hr), of which 0 (0.0/hr) were associated with an arousal.   AROUSAL: There were 27 arousals in total, for an arousal index of 4 arousals/hour.  Of these, 2 were identified as respiratory-related arousals (0 /h), 0 were PLM-related arousals (0 /h), and 30 were non-specific arousals (4 /h).   EEG: Review of the EEG showed no abnormal electrical discharges and symmetrical bihemispheric findings.    EKG: The EKG revealed normal sinus rhythm (NSR). The average heart rate during sleep was 60 bpm.   AUDIO/VIDEO REVIEW: The audio and video review did not show any abnormal or unusual behaviors, movements, phonations or vocalizations. The patient took 1 restroom break. Snoring was noted, and was mild and intermittent.  POST-STUDY QUESTIONNAIRE: Post study, the patient indicated, that sleep was the same as usual.   IMPRESSION:   1. Primary Snoring 2. Dysfunctions associated with sleep stages or arousal from sleep  RECOMMENDATIONS:  1. This study does not demonstrate any significant obstructive or central sleep disordered breathing with an AHI of less than 5/hour - Her AHI was 0.7/hour - and oxygen saturations at or above 87% for the night. Mild intermittent snoring was noted. Treatment with a positive airway pressure device, such as CPAP or autoPAP is not indicated.  2. This study shows minimal sleep fragmentation and mildly abnormal sleep stage percentages; these are nonspecific findings and per se do not signify an intrinsic sleep disorder or a cause for the patient's sleep-related symptoms. Causes include (but are not limited to) the first night effect of the sleep study, circadian rhythm disturbances, medication effect or an underlying mood disorder or medical problem.  3. The patient should be cautioned not to drive, work at heights, or operate dangerous or heavy equipment when tired or sleepy. Review and  reiteration of good sleep hygiene measures should be pursued with any patient. 4. The patient will be advised to follow up with the referring provider, who will be notified of the test results.   I certify that I have reviewed the entire raw data recording prior to the issuance of this report in accordance with the Standards of Accreditation of the American Academy of Sleep Medicine (AASM).  Huston Foley, MD, PhD Medical Director, [facility]) Diplomat, ABPN (Neurology and Sleep)               Technical Report:   General Information  Name: Greylyn, Burtness BMI: 25.06 Physician: Huston Foley, MD  ID: [id number] Height: 62.0 in Technician: Margaretann Loveless, RPSGT  Sex: Female Weight: 137.0 lb Record: [id number]  Age: 63 [August 12, 1966] Date: 06/03/2023    Medical & Medication History    57 year old female with an underlying medical history of arthritis, status post rotator cuff repair on the L, glaucoma, hyperlipidemia, migraine headaches, anemia, anxiety, depression, and borderline overweight state, who reports worsening daytime somnolence for the past 9+ months. She does snore, her husband has also noticed occasional snorting sounds or gasping sounds and  pauses in her breathing while asleep.  Aspirin, Vitamin D3, Flexeril, Hydroxyzine, Polysaccharide Iron, Prometrium, Cymbalta, Prozac   Sleep Disorder      Comments   The patient came into the lab for a PSG. The patient took Hydroxyzine, Prozac and Gabapentin prior to start of study. The patient used bite guard during the study. The patient had one restroom break. EKG kept in NSR. Mild snoring. All sleep stages witnessed. Respiratory events scored with a 3% desat. Slept lateral and supine. AHI was 0.5 after 2 hrs of TST. Some leg movements.     Lights out: 09:00:00 PM Lights on: 05:04:19 AM   Time Total Supine Side Prone Upright  Recording (TRT) 8h 4.86m 1h 33.49m 6h 31.89m 0h 0.32m 0h 0.11m  Sleep  (TST) 6h 58.75m 0h 55.38m 6h 3.45m 0h 0.62m 0h 0.37m   Latency N1 N2 N3 REM Onset Per. Slp. Eff.  Actual 0h 0.89m 0h 3.42m 0h 45.61m 3h 35.77m 0h 23.3m 0h 23.41m 86.38%   Stg Dur Wake N1 N2 N3 REM  Total 66.0 5.5 257.0 43.5 112.5  Supine 38.0 0.5 43.0 11.5 0.0  Side 28.0 5.0 214.0 32.0 112.5  Prone 0.0 0.0 0.0 0.0 0.0  Upright 0.0 0.0 0.0 0.0 0.0   Stg % Wake N1 N2 N3 REM  Total 13.6 1.3 61.4 10.4 26.9  Supine 7.8 0.1 10.3 2.7 0.0  Side 5.8 1.2 51.1 7.6 26.9  Prone 0.0 0.0 0.0 0.0 0.0  Upright 0.0 0.0 0.0 0.0 0.0     Apnea Summary Sub Supine Side Prone Upright  Total 1 Total 1 0 1 0 0    REM 0 0 0 0 0    NREM 1 0 1 0 0  Obs 1 REM 0 0 0 0 0    NREM 1 0 1 0 0  Mix 0 REM 0 0 0 0 0    NREM 0 0 0 0 0  Cen 0 REM 0 0 0 0 0    NREM 0 0 0 0 0   Rera Summary Sub Supine Side Prone Upright  Total 0 Total 0 0 0 0 0    REM 0 0 0 0 0    NREM 0 0 0 0 0   Hypopnea Summary Sub Supine Side Prone Upright  Total 4 Total 4 1 3  0 0    REM 2 0 2 0 0    NREM 2 1 1  0 0   4% Hypopnea Summary Sub Supine Side Prone Upright  Total (4%) 3 Total 3 1 2  0 0    REM 2 0 2 0 0    NREM 1 1 0 0 0     AHI Total Obs Mix Cen  0.72 Apnea 0.14 0.14 0.00 0.00   Hypopnea 0.57 -- -- --  0.57 Hypopnea (4%) 0.43 -- -- --    Total Supine Side Prone Upright  Position AHI 0.72 1.09 0.66 0.00 0.00  REM AHI 1.07   NREM AHI 0.59   Position RDI 0.72 1.09 0.66 0.00 0.00  REM RDI 1.07   NREM RDI 0.59    4% Hypopnea Total Supine Side Prone Upright  Position AHI (4%) 0.57 1.09 0.50 0.00 0.00  REM AHI (4%) 1.07   NREM AHI (4%) 0.39   Position RDI (4%) 0.57 1.09 0.50 0.00 0.00  REM RDI (4%) 1.07   NREM RDI (4%) 0.39    Desaturation Information Threshold: 2% <100% <90% <80% <70% <60% <50% <40%  Supine  12.0 1.0 0.0 0.0 0.0 0.0 0.0  Side 38.0 0.0 0.0 0.0 0.0 0.0 0.0  Prone 0.0 0.0 0.0 0.0 0.0 0.0 0.0  Upright 0.0 0.0 0.0 0.0 0.0 0.0 0.0  Total 50.0 1.0 0.0 0.0 0.0 0.0 0.0  Index 6.5 0.1 0.0 0.0 0.0 0.0 0.0    Threshold: 3% <100% <90% <80% <70% <60% <50% <40%  Supine 7.0 1.0 0.0 0.0 0.0 0.0 0.0  Side 4.0 0.0 0.0 0.0 0.0 0.0 0.0  Prone 0.0 0.0 0.0 0.0 0.0 0.0 0.0  Upright 0.0 0.0 0.0 0.0 0.0 0.0 0.0  Total 11.0 1.0 0.0 0.0 0.0 0.0 0.0  Index 1.4 0.1 0.0 0.0 0.0 0.0 0.0   Threshold: 4% <100% <90% <80% <70% <60% <50% <40%  Supine 4.0 1.0 0.0 0.0 0.0 0.0 0.0  Side 2.0 0.0 0.0 0.0 0.0 0.0 0.0  Prone 0.0 0.0 0.0 0.0 0.0 0.0 0.0  Upright 0.0 0.0 0.0 0.0 0.0 0.0 0.0  Total 6.0 1.0 0.0 0.0 0.0 0.0 0.0  Index 0.8 0.1 0.0 0.0 0.0 0.0 0.0   Threshold: 3% <100% <90% <80% <70% <60% <50% <40%  Supine 7 1 0 0 0 0 0  Side 4 0 0 0 0 0 0  Prone 0 0 0 0 0 0 0  Upright 0 0 0 0 0 0 0  Total 11 1 0 0 0 0 0   Awakening/Arousal Information # of Awakenings 6  Wake after sleep onset 43.64m  Wake after persistent sleep 43.10m   Arousal Assoc. Arousals Index  Apneas 0 0.0  Hypopneas 2 0.3  Leg Movements 0 0.0  Snore 0 0.0  PTT Arousals 0 0.0  Spontaneous 30 4.3  Total 32 4.6  Leg Movement Information PLMS LMs Index  Total LMs during PLMS 0 0.0  LMs w/ Microarousals 0 0.0   LM LMs Index  w/ Microarousal 0 0.0  w/ Awakening 0 0.0  w/ Resp Event 0 0.0  Spontaneous 13 1.9  Total 13 1.9     Desaturation threshold setting: 3% Minimum desaturation setting: 10 seconds SaO2 nadir: 82% The longest event was a 39 sec obstructive Hypopnea with a minimum SaO2 of 87%. The lowest SaO2 was 87% associated with a 39 sec obstructive Hypopnea. EKG Rates EKG Avg Max Min  Awake 64 113 55  Asleep 60 75 49  EKG Events: Tachycardia

## 2023-06-19 DIAGNOSIS — N926 Irregular menstruation, unspecified: Secondary | ICD-10-CM | POA: Diagnosis not present

## 2023-06-19 DIAGNOSIS — R233 Spontaneous ecchymoses: Secondary | ICD-10-CM | POA: Diagnosis not present

## 2023-06-19 DIAGNOSIS — N959 Unspecified menopausal and perimenopausal disorder: Secondary | ICD-10-CM | POA: Diagnosis not present

## 2023-06-24 ENCOUNTER — Other Ambulatory Visit (HOSPITAL_BASED_OUTPATIENT_CLINIC_OR_DEPARTMENT_OTHER): Payer: Self-pay

## 2023-06-24 MED ORDER — ESTRADIOL 0.025 MG/24HR TD PTTW
MEDICATED_PATCH | TRANSDERMAL | 3 refills | Status: DC
  Filled 2023-06-24: qty 24, 84d supply, fill #0

## 2023-06-25 ENCOUNTER — Encounter: Payer: Self-pay | Admitting: Family Medicine

## 2023-06-25 ENCOUNTER — Other Ambulatory Visit: Payer: Self-pay

## 2023-06-25 ENCOUNTER — Other Ambulatory Visit (HOSPITAL_BASED_OUTPATIENT_CLINIC_OR_DEPARTMENT_OTHER): Payer: Self-pay

## 2023-06-25 MED ORDER — PROGESTERONE MICRONIZED 100 MG PO CAPS
ORAL_CAPSULE | ORAL | 0 refills | Status: DC
  Filled 2023-06-25: qty 180, 90d supply, fill #0

## 2023-06-25 NOTE — Telephone Encounter (Signed)
Patient scheduled for 9/30 @10AM  with Dr. Claiborne Billings

## 2023-06-27 ENCOUNTER — Ambulatory Visit (HOSPITAL_BASED_OUTPATIENT_CLINIC_OR_DEPARTMENT_OTHER)
Admission: RE | Admit: 2023-06-27 | Discharge: 2023-06-27 | Disposition: A | Discharge status: Home / Self Care | Payer: 59 | Source: Ambulatory Visit | Attending: Family Medicine | Admitting: Family Medicine

## 2023-06-27 DIAGNOSIS — E782 Mixed hyperlipidemia: Secondary | ICD-10-CM | POA: Insufficient documentation

## 2023-06-27 DIAGNOSIS — Z8249 Family history of ischemic heart disease and other diseases of the circulatory system: Secondary | ICD-10-CM | POA: Insufficient documentation

## 2023-06-30 DIAGNOSIS — L578 Other skin changes due to chronic exposure to nonionizing radiation: Secondary | ICD-10-CM | POA: Diagnosis not present

## 2023-06-30 DIAGNOSIS — D225 Melanocytic nevi of trunk: Secondary | ICD-10-CM | POA: Diagnosis not present

## 2023-06-30 DIAGNOSIS — L814 Other melanin hyperpigmentation: Secondary | ICD-10-CM | POA: Diagnosis not present

## 2023-06-30 DIAGNOSIS — L821 Other seborrheic keratosis: Secondary | ICD-10-CM | POA: Diagnosis not present

## 2023-06-30 DIAGNOSIS — L570 Actinic keratosis: Secondary | ICD-10-CM | POA: Diagnosis not present

## 2023-06-30 DIAGNOSIS — Z85828 Personal history of other malignant neoplasm of skin: Secondary | ICD-10-CM | POA: Diagnosis not present

## 2023-07-03 ENCOUNTER — Other Ambulatory Visit: Payer: Self-pay

## 2023-07-03 DIAGNOSIS — M5416 Radiculopathy, lumbar region: Secondary | ICD-10-CM

## 2023-07-14 ENCOUNTER — Other Ambulatory Visit: Payer: 59

## 2023-07-20 ENCOUNTER — Other Ambulatory Visit (HOSPITAL_BASED_OUTPATIENT_CLINIC_OR_DEPARTMENT_OTHER): Payer: Self-pay

## 2023-07-21 ENCOUNTER — Other Ambulatory Visit (HOSPITAL_BASED_OUTPATIENT_CLINIC_OR_DEPARTMENT_OTHER): Payer: Self-pay

## 2023-07-21 ENCOUNTER — Ambulatory Visit: Payer: Self-pay

## 2023-07-21 ENCOUNTER — Other Ambulatory Visit: Payer: Self-pay | Admitting: Family Medicine

## 2023-07-21 ENCOUNTER — Ambulatory Visit: Payer: 59 | Admitting: Family Medicine

## 2023-07-21 DIAGNOSIS — M25511 Pain in right shoulder: Secondary | ICD-10-CM

## 2023-07-21 DIAGNOSIS — N898 Other specified noninflammatory disorders of vagina: Secondary | ICD-10-CM | POA: Diagnosis not present

## 2023-07-21 MED ORDER — HYDROXYZINE HCL 10 MG PO TABS
10.0000 mg | ORAL_TABLET | Freq: Three times a day (TID) | ORAL | 2 refills | Status: DC | PRN
  Filled 2023-07-21: qty 30, 5d supply, fill #0
  Filled 2023-10-17: qty 30, 5d supply, fill #1
  Filled 2024-03-03: qty 30, 5d supply, fill #2

## 2023-07-21 MED ORDER — METRONIDAZOLE 0.75 % VA GEL
1.0000 | Freq: Every day | VAGINAL | 0 refills | Status: DC
  Filled 2023-07-21: qty 70, 7d supply, fill #0

## 2023-07-22 DIAGNOSIS — H0288A Meibomian gland dysfunction right eye, upper and lower eyelids: Secondary | ICD-10-CM | POA: Diagnosis not present

## 2023-07-22 DIAGNOSIS — H2513 Age-related nuclear cataract, bilateral: Secondary | ICD-10-CM | POA: Diagnosis not present

## 2023-07-22 DIAGNOSIS — H04422 Chronic lacrimal canaliculitis of left lacrimal passage: Secondary | ICD-10-CM | POA: Diagnosis not present

## 2023-07-22 DIAGNOSIS — H0288B Meibomian gland dysfunction left eye, upper and lower eyelids: Secondary | ICD-10-CM | POA: Diagnosis not present

## 2023-07-22 DIAGNOSIS — H1045 Other chronic allergic conjunctivitis: Secondary | ICD-10-CM | POA: Diagnosis not present

## 2023-07-22 DIAGNOSIS — H02055 Trichiasis without entropian left lower eyelid: Secondary | ICD-10-CM | POA: Diagnosis not present

## 2023-07-24 ENCOUNTER — Other Ambulatory Visit (HOSPITAL_BASED_OUTPATIENT_CLINIC_OR_DEPARTMENT_OTHER): Payer: Self-pay

## 2023-07-28 ENCOUNTER — Other Ambulatory Visit: Payer: Self-pay | Admitting: Family Medicine

## 2023-07-28 ENCOUNTER — Other Ambulatory Visit (HOSPITAL_BASED_OUTPATIENT_CLINIC_OR_DEPARTMENT_OTHER): Payer: Self-pay

## 2023-07-28 MED ORDER — GABAPENTIN 100 MG PO CAPS
200.0000 mg | ORAL_CAPSULE | Freq: Two times a day (BID) | ORAL | 0 refills | Status: DC
  Filled 2023-07-28: qty 180, 45d supply, fill #0

## 2023-07-28 NOTE — Telephone Encounter (Signed)
Sent patient message to see if she is still in need of medication

## 2023-08-03 NOTE — Progress Notes (Unsigned)
08/03/2023 Michele [name] [id number] 1965-11-05   Chief Complaint: Upper abdominal pain, discuss SIBO testing   History of Present Illness: Michele [name] is a 57 year old female with a past medical history of anxiety, depression, hypercholesterolemia. Prior [employer] nurse. She is known by Dr. Meridee Score. She describes having a prior history of excessive abdominal bloat which occurred every thee days in 2022. She underwent an EGD and colonoscopy 07/2021 by Dr. Meridee Score which were unrevealing, no evidence of celiac disease or IBD. Around the fall of 2023, she started having consistent normal bowel movements most days and her abdominal bloat improved. Over the past year, she feels her GI tract has slowed down with recurrence of constipation, intermittent abdominal bloat and pain to the upper abdomen (she noted specifically to the transverse colon). She previously took [place] and MiraLAX 11/2022 without significant improvement. She started taking a semifiber forte supplement at bedtime which results in passing normal formed bowel movements 4 to 5 days weekly. No rectal bleeding or black stools. She developed horrible upper abdominal pain to her "transverse colon" with notable distention described as the size of a grapefruit an she presented to the urgent care clinic on 04/08/2023. At that time, her abdominal pain was thought to be secondary to constipation she was prescribed Miralax and was discharged home. She presented to the ED 05/06/2023 due to having severe upper abdominal pain.  Labs in the ED showed a WBC count of 11.7.  Hemoglobin 13.2.  Hematocrit 38.1.  Platelet 304.  Normal LFTs.  Lipase 23.  Sodium 131. CTAP with contrast showed the duodenum and proximal jejunum were fluid-filled and mildly distended up to 2.8 cm diameter, central small bowel in the mid abdomen and pelvis shows similar mild distension with intraluminal fluid measuring up to 2.5 cm diameter and a moderate to large volume of  stool was noted in the right and transverse colon without obstruction. She was discharged home with instructions to take magnesium citrate which she took without passing a significant amount of stool. She went on a clear liquid diet for about 6 days as she had difficulty advancing her diet resulting in losing 5 to 6 pounds which she has not gained back.  She denies having any heartburn or dysphagia but has increased burping which occurs frequently throughout the day. She passed 3 normal brown bowel movements yesterday evening. She describes having possible gluten intolerance and she attempts to remain gluten-free for the past 6 years  but does ingest a week products intermittently i.e. graham crackers and desserts.  Laboratory studies 01/2023 showed a low ferritin level of 5.5 and saturation ratios 13.1.  She took oral iron as prescribed by her PCP for about 5 weeks but stopped it due to associated constipation.  She suspects that she is menopausal, last menstrual cycle was 2-1/2 months ago with prior history of intermittent heavy menstrual bleeding.  She reported recent GYN exam was normal.  No known family history of celiac disease, IBD or colorectal cancer.     Latest Ref Rng & Units 05/06/2023    4:46 AM 01/27/2023    7:52 AM 07/20/2021    4:01 PM  CBC  WBC 4.0 - 10.5 K/uL 11.7  5.3  4.7   Hemoglobin 12.0 - 15.0 g/dL 13.2  44.0  10.2   Hematocrit 36.0 - 46.0 % 38.1  38.3  36.3   Platelets 150 - 400 K/uL 304  287.0  270.0  Latest Ref Rng & Units 05/06/2023    4:46 AM 01/27/2023    7:52 AM 06/10/2022    9:23 AM  CMP  Glucose 70 - 99 mg/dL 161  86  096   BUN 6 - 20 mg/dL 13  9  10    Creatinine 0.44 - 1.00 mg/dL 0.45  4.09  8.11   Sodium 135 - 145 mmol/L 131  138  138   Potassium 3.5 - 5.1 mmol/L 4.2  3.9  4.8   Chloride 98 - 111 mmol/L 100  108  102   CO2 22 - 32 mmol/L 22  26  27    Calcium 8.9 - 10.3 mg/dL 9.5  9.0  9.4   Total Protein 6.5 - 8.1 g/dL 6.8  6.1    Total Bilirubin 0.3 - 1.2  mg/dL 0.6  0.3    Alkaline Phos 38 - 126 U/L 53  48    AST 15 - 41 U/L 16  14    ALT 0 - 44 U/L 9  7      CTAP with contrast 05/06/2023:  FINDINGS: Lower chest: Unremarkable.   Hepatobiliary: Scattered tiny hypodensities in the liver parenchyma are too small to characterize but are stable and There is no evidence for gallstones, gallbladder wall thickening, or pericholecystic fluid. No intrahepatic or extrahepatic biliary dilation. statistically most likely benign. No followup imaging is recommended.   Pancreas: No focal mass lesion. No dilatation of the main duct. No intraparenchymal cyst. No peripancreatic edema.   Spleen: No splenomegaly. No suspicious focal mass lesion.   Adrenals/Urinary Tract: No adrenal nodule or mass. Kidneys unremarkable. No evidence for hydroureter. The urinary bladder appears normal for the degree of distention.   Stomach/Bowel: Stomach is nondistended. The appearance of wall thickening in the stomach is similar to prior and likely related to underdistention. Duodenum is normally positioned as is the ligament of Treitz. Duodenum and proximal jejunum are fluid-filled and mildly distended up to 2.8 cm diameter with mid and distal jejunum. Decompressed. More central small bowel in the mid abdomen and pelvis shows similar mild distension with intraluminal fluid measuring up to 2.5 cm diameter. Other small bowel loops in the abdomen or pelvis are completely decompressed. The terminal ileum is normal. The appendix is best seen on coronal images and is unremarkable. No gross colonic mass. No colonic wall thickening. Moderate to large stool volume noted right and transverse colon.   Vascular/Lymphatic: No abdominal aortic aneurysm. No abdominal aortic atherosclerotic calcification. There is no gastrohepatic or hepatoduodenal ligament lymphadenopathy. No retroperitoneal or mesenteric lymphadenopathy. No pelvic sidewall lymphadenopathy.   Reproductive: The  uterus is unremarkable. 2.5 cm simple appearing cyst in the left adnexal region is not substantially changed in the interval. No followup imaging is recommended. This recommendation follows ACR consensus guidelines: White Paper of the ACR Incidental Findings Committee II on Adnexal Findings. J Am Coll Radiol [phone].   Other: No intraperitoneal free fluid.   Musculoskeletal: No worrisome lytic or sclerotic osseous abnormality.   IMPRESSION: 1. Mild distension of fluid-filled duodenum and proximal jejunum with more central small bowel loops in the mid abdomen and pelvis showing similar mild distension. No small bowel wall thickening. Other small bowel loops in the abdomen or pelvis are completely decompressed. Imaging features are nonspecific but may be related to enteritis or component of underlying ileus. 2. Moderate to large stool volume in the right and transverse colon. Imaging features could be compatible with constipation in the appropriate clinical setting.  PAST GI  PROCEDURES:   EGD 08/10/2021: - No gross lesions in esophagus. Biopsied. Z-line regular, 36 cm from the incisors.  - Erythematous mucosa in the antrum. No other gross lesions in the stomach. Biopsied. - No gross lesions in the duodenal bulb, in the first portion of the duodenum and in the second portion of the duodenum. Biopsied.  Colonoscopy 07/31/2021: - Hemorrhoids found on digital rectal exam.  - The examined portion of the ileum was normal.  - Normal mucosa in the entire examined colon. Biopsied.  - Non-bleeding non-thrombosed external and internal hemorrhoids -10 year recall colonoscopy  - UNREMARKABLE COLONIC MUCOSA. - NO MICROSCOPIC COLITIS, ACTIVE INFLAMMATION OR CHRONIC CHANGES  1. Surgical [P], duodenal - DUODENAL MUCOSA WITH NORMAL VILLOUS ARCHITECTURE. - NO VILLOUS ATROPHY OR INCREASED INTRAEPITHELIAL LYMPHOCYTES. 2. Surgical [P], gastritis - UNREMARKABLE ANTRAL AND OXYNTIC MUCOSA. [place] NEGATIVE FOR HELICOBACTER PYLORI. 3. Surgical [P], esophagus - UNREMARKABLE SQUAMOUS MUCOSA. - NO EOSINOPHILIC ESOPHAGITIS (LESS THAN 5 PER HIGH POWER FIELD).  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   DDD (degenerative disc disease), cervical    Depression    Dorsalgia    Glaucoma    Hypercholesterolemia    Intestinal gas excretion    Memory loss 01/13/2020   Migraine 08/17/2020   Nonallopathic lesion of cervical region 01/23/2021   l areas are chronic   Patient tolerated the procedure well with improvement in symptoms  Patient given exercises, stretches and lifestyle modifications  See medications in patient instructions if given  Patient will follow up in 4-8 weeks   Peptic ulcer    Scapular dyskinesis 01/23/2021   Scarlet fever    Sequelae of other specified infectious and parasitic diseases 11/20/2021   Skin cancer    Tick bite 02/22/2018   UTI (urinary tract infection)    Yeast infection    Past Surgical History:  Procedure Laterality Date   BASAL CELL CARCINOMA EXCISION     BREAST BIOPSY Left 1992   Duct removal (No scar seen)    BREAST EXCISIONAL BIOPSY Left 1992   COLONOSCOPY  07/2021   DACRORHINOCYSTOTOMY  10/2020   LEFT HEART CATH AND CORONARY ANGIOGRAPHY N/A 01/16/2018   Procedure: LEFT HEART CATH AND CORONARY ANGIOGRAPHY;  Surgeon: Yvonne Kendall, MD;  Location: [facility];  Service: Cardiovascular;  Laterality: N/A;   SHOULDER ARTHROSCOPY WITH ROTATOR CUFF REPAIR AND SUBACROMIAL DECOMPRESSION Left 11/12/2022   Procedure: SHOULDER ARTHROSCOPY WITH ROTATOR CUFF DEBBRIDEMENT AND SUBACROMIAL DECOMPRESSION;  Surgeon: Jones Broom, MD;  Location: [facility];  Service: Orthopedics;  Laterality: Left;    UPPER GASTROINTESTINAL ENDOSCOPY  07/2021   Current Outpatient Medications on File Prior to Visit  Medication Sig Dispense Refill   aspirin EC 81 MG tablet Take 1 tablet by mouth every other day.     Cholecalciferol (VITAMIN D3) 25 MCG/SPRAY LIQD Take 250 mcg by mouth daily.     estradiol (VIVELLE-DOT) 0.025 MG/24HR Apply 1 patch twice a week by transdermal route. 24 patch 3   FLUoxetine (PROZAC) 10 MG capsule Take 1 capsule (10mg  total) by mouth every other day alternating with 2 capsules (20mg  total) by mouth every other day. 135 capsule 3   gabapentin (NEURONTIN) 100 MG capsule Take 2 capsules (  200 mg total) by mouth 2 (two) times daily. (Patient taking differently: Take 200 mg by mouth as needed.) 180 capsule 0   hydrOXYzine (ATARAX) 10 MG tablet Take 1-2 tablets (10-20 mg total) by mouth 3 (three) times daily as needed for itching. 30 tablet 2   progesterone (PROMETRIUM) 100 MG capsule Take 1 capsule (100 mg total) by mouth at bedtime AND increase to 2 capsules (200 mg total) at bedtime prior to cycle 180 capsule 0   No current facility-administered medications on file prior to visit.   Allergies  Allergen Reactions   Lipitor [Atorvastatin Calcium] Other (See Comments)    Memory issue; myalgia   Celebrex [Celecoxib] Rash   Current Medications, Allergies, Past Medical History, Past Surgical History, Family History and Social History were reviewed in [employer] record.  Review of Systems:   Constitutional: + Weight loss and hot flashes. Respiratory: Negative for shortness of breath.   Cardiovascular: Negative for chest pain, palpitations and leg swelling.  Gastrointestinal: See HPI.  Musculoskeletal: Negative for back pain or muscle aches.  Neurological: Negative for dizziness, headaches or paresthesias.   Physical Exam: LMP: 2 1/50m months ago BP 100/60 (BP Location: Left Arm, Patient Position: Sitting, Cuff Size: Normal)   Pulse 78   Ht 5\' 2"  (1.575 m)    Wt 134 lb 8 oz (61 kg)   LMP 11/29/2021   BMI 24.60 kg/m   General: 57 year old female in no acute distress. Head: Normocephalic and atraumatic. Eyes: No scleral icterus. Conjunctiva pink . Ears: Normal auditory acuity. Mouth: Dentition intact. No ulcers or lesions.  Lungs: Clear throughout to auscultation. Heart: Regular rate and rhythm, no murmur. Abdomen: Soft, nontender and nondistended. No masses or hepatomegaly. Normal bowel sounds x 4 quadrants.  No bruit. Rectal: Deferred.  Musculoskeletal: Symmetrical with no gross deformities. Extremities: No edema. Neurological: Alert oriented x 4. No focal deficits.  Psychological: Alert and cooperative. Normal mood and affect  Assessment and Recommendations:  57 year old female with recurrent upper abdominal pain and bloat. CTAP 05/06/2023 showed mild distension of fluid-filled duodenum and proximal jejunum with more central small bowel loops in the mid abdomen and pelvis showing similar mild distension, findings were nonspecific but possibly suggestive of enteritis or underlying ileus.  -SIBO breath test  -CT enterography to rule out small bowel inflammatory process/IBD -CBC, CMP, TTG, IgA, CRP and sed rate  Iron deficiency without anemia.  EGD and colonoscopy 07/2021 findings did not identify possible etiology for IDA.  Note, patient was mostly gluten-free at the time EGD and duodenal biopsies were obtained.  Intermittent history of menorrhagia may have been a contributing factor, LMP 2-1/2 months ago. -See plan above -Add TIBC and ferritin level to the above lab order  Constipation -Fiber supplement of choice daily as tolerated  Increased burping -Famotidine 20 mg daily  Further recommendations to be determined after the above evaluation completed

## 2023-08-04 ENCOUNTER — Other Ambulatory Visit (INDEPENDENT_AMBULATORY_CARE_PROVIDER_SITE_OTHER): Payer: 59

## 2023-08-04 ENCOUNTER — Ambulatory Visit (INDEPENDENT_AMBULATORY_CARE_PROVIDER_SITE_OTHER): Payer: 59 | Admitting: Nurse Practitioner

## 2023-08-04 ENCOUNTER — Encounter: Payer: Self-pay | Admitting: Nurse Practitioner

## 2023-08-04 VITALS — BP 100/60 | HR 78 | Ht 62.0 in | Wt 134.5 lb

## 2023-08-04 DIAGNOSIS — E611 Iron deficiency: Secondary | ICD-10-CM | POA: Diagnosis not present

## 2023-08-04 DIAGNOSIS — R1010 Upper abdominal pain, unspecified: Secondary | ICD-10-CM

## 2023-08-04 DIAGNOSIS — R142 Eructation: Secondary | ICD-10-CM

## 2023-08-04 DIAGNOSIS — R790 Abnormal level of blood mineral: Secondary | ICD-10-CM

## 2023-08-04 DIAGNOSIS — L570 Actinic keratosis: Secondary | ICD-10-CM | POA: Diagnosis not present

## 2023-08-04 DIAGNOSIS — K5900 Constipation, unspecified: Secondary | ICD-10-CM | POA: Diagnosis not present

## 2023-08-04 DIAGNOSIS — L821 Other seborrheic keratosis: Secondary | ICD-10-CM | POA: Diagnosis not present

## 2023-08-04 DIAGNOSIS — D485 Neoplasm of uncertain behavior of skin: Secondary | ICD-10-CM | POA: Diagnosis not present

## 2023-08-04 LAB — CBC
HCT: 41.2 % (ref 36.0–46.0)
Hemoglobin: 13.5 g/dL (ref 12.0–15.0)
MCHC: 32.8 g/dL (ref 30.0–36.0)
MCV: 94.2 fL (ref 78.0–100.0)
Platelets: 314 10*3/uL (ref 150.0–400.0)
RBC: 4.38 Mil/uL (ref 3.87–5.11)
RDW: 14.9 % (ref 11.5–15.5)
WBC: 4.8 10*3/uL (ref 4.0–10.5)

## 2023-08-04 LAB — COMPREHENSIVE METABOLIC PANEL
ALT: 10 U/L (ref 0–35)
AST: 16 U/L (ref 0–37)
Albumin: 4.1 g/dL (ref 3.5–5.2)
Alkaline Phosphatase: 55 U/L (ref 39–117)
BUN: 10 mg/dL (ref 6–23)
CO2: 27 meq/L (ref 19–32)
Calcium: 9.6 mg/dL (ref 8.4–10.5)
Chloride: 103 meq/L (ref 96–112)
Creatinine, Ser: 0.8 mg/dL (ref 0.40–1.20)
GFR: 80.77 mL/min (ref >60.00)
Glucose, Bld: 97 mg/dL (ref 70–99)
Potassium: 4.5 meq/L (ref 3.5–5.1)
Sodium: 136 meq/L (ref 135–145)
Total Bilirubin: 0.6 mg/dL (ref 0.2–1.2)
Total Protein: 6.8 g/dL (ref 6.0–8.3)

## 2023-08-04 LAB — C-REACTIVE PROTEIN: CRP: <1 mg/dL (ref 0.5–20.0)

## 2023-08-04 LAB — SEDIMENTATION RATE: Sed Rate: 7 mm/h (ref 0–30)

## 2023-08-04 NOTE — Patient Instructions (Signed)
You have been scheduled for a CT enterography at [facility], 1st floor Radiology. You are scheduled on 08/11/23 at 2:00 . You should arrive at 12:45 pm for registration. Make sure that you have nothing to eat or drink after midnight.  If you have any questions regarding your exam or if you need to reschedule, you may call [facility] at [phone] between the hours of 8:00 am and 5:00 pm, Monday-Friday.   Your provider has requested that you go to the basement level for lab work before leaving today. Press "B" on the elevator. The lab is located at the first door on the left as you exit the elevator.  You have been given a testing kit to check for small intestine bacterial overgrowth (SIBO) which is completed by a company named [facility]. Make sure to return your test in the mail using the return mailing label given to you along with the kit. The test order, your demographic and insurance information have all already been sent to the company. [facility] will collect an upfront charge of $99.74 for commercial insurance plans and $209.74 if you are paying cash. Make sure to discuss with [facility] PRIOR to having the test to see if they have gotten information from your insurance company as to how much your testing will cost out of pocket, if any. Please contact [facility] at phone number [phone] to get instructions regarding how to perform the test as our office is unable to give specific testing instructions.  Pepcid 20 mg (over the counter)- take 1 by mouth daily for burping  Due to recent changes in healthcare laws, you may see the results of your imaging and laboratory studies on MyChart before your provider has had a chance to review them.  We understand that in some cases there may be results that are confusing or concerning to you. Not all laboratory results come back in the same time frame and the provider may be waiting for multiple results in order  to interpret others.  Please give [place] 48 hours in order for your provider to thoroughly review all the results before contacting the office for clarification of your results.   Thank you for trusting me with your gastrointestinal care!   Alcide Evener, CRNP

## 2023-08-04 NOTE — Addendum Note (Signed)
Addended by: Rise Paganini on: 08/04/2023 01:46 PM   Modules accepted: Orders

## 2023-08-04 NOTE — Addendum Note (Signed)
Addended by: Rise Paganini on: 08/04/2023 01:30 PM   Modules accepted: Orders

## 2023-08-05 ENCOUNTER — Other Ambulatory Visit (INDEPENDENT_AMBULATORY_CARE_PROVIDER_SITE_OTHER): Payer: 59

## 2023-08-05 ENCOUNTER — Ambulatory Visit: Payer: 59

## 2023-08-05 DIAGNOSIS — R790 Abnormal level of blood mineral: Secondary | ICD-10-CM

## 2023-08-05 DIAGNOSIS — R1010 Upper abdominal pain, unspecified: Secondary | ICD-10-CM

## 2023-08-05 DIAGNOSIS — R142 Eructation: Secondary | ICD-10-CM

## 2023-08-05 DIAGNOSIS — K5900 Constipation, unspecified: Secondary | ICD-10-CM

## 2023-08-05 LAB — IBC PANEL
Iron: 100 ug/dL (ref 42–145)
Saturation Ratios: 29.6 % (ref 20.0–50.0)
TIBC: 337.4 ug/dL (ref 250.0–450.0)
Transferrin: 241 mg/dL (ref 212.0–360.0)

## 2023-08-05 LAB — IGA: Immunoglobulin A: 200 mg/dL (ref 47–310)

## 2023-08-05 LAB — FERRITIN: Ferritin: 9.2 ng/mL — ABNORMAL LOW (ref 10.0–291.0)

## 2023-08-05 LAB — TISSUE TRANSGLUTAMINASE ABS,IGG,IGA
(tTG) Ab, IgA: <1 U/mL
(tTG) Ab, IgG: <1 U/mL

## 2023-08-05 NOTE — Addendum Note (Signed)
Addended by: Clearnce Sorrel on: 08/05/2023 11:23 AM   Modules accepted: Orders

## 2023-08-05 NOTE — Progress Notes (Unsigned)
[name] [address] [place] [address] Phone: [phone] Subjective:   Michele [name], am serving as a scribe for Dr. Antoine Primas.  I'm seeing this patient by the request  of:  [name], [name], DO  CC: left shoulder pain f/u   [id number]  03/11/2023 Patient does have significant degenerative disc disease mostly at the L4-L5 area. Worsening pain with extension. Concern significantly though that patient is having worsening pain out of proportion. Patient has failed conservative therapy including Cymbalta, gabapentin, formal physical therapy. We have attempted osteopathic manipulation as well. Patient's pain is out of proportion and with the longevity of this I do feel that advanced imaging is warranted. Would recommend an MRI of the lumbar and pelvis area. See if anything is potentially contributing and see what type of or operative care is necessary. Follow-up with me again in 6 to 8 weeks otherwise. Toradol and Depo-Medrol given today.   Updated 08/06/2023 Michele [name] is a 57 y.o. female coming in with complaint of back pain. L shoulder hurts when she sleeps. Okay when doing elliptical.  Patient is complaining more of the left shoulder pain again.  Patient's left shoulder on MRI showed moderate supraspinatus tendinosis with a focal high-grade partial-thickness tear in 2023.  PRP was given August 19, 2022.    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   DDD (degenerative disc disease), cervical    Depression    Dorsalgia    Glaucoma    Hypercholesterolemia    Intestinal gas excretion    Memory loss 01/13/2020   Migraine 08/17/2020   Nonallopathic lesion of cervical region 01/23/2021   l areas are chronic    Patient tolerated the procedure well with improvement in symptoms  Patient given exercises, stretches and lifestyle modifications  See medications in patient instructions if given  Patient will follow up in 4-8 weeks   Peptic ulcer    Scapular dyskinesis 01/23/2021   Scarlet fever    Sequelae of other specified infectious and parasitic diseases 11/20/2021   Skin cancer    Tick bite 02/22/2018   UTI (urinary tract infection)    Yeast infection    Past Surgical History:  Procedure Laterality Date   BASAL CELL CARCINOMA EXCISION     BREAST BIOPSY Left 1992   Duct removal (No scar seen)    BREAST EXCISIONAL BIOPSY Left 1992   COLONOSCOPY  07/2021   DACRORHINOCYSTOTOMY  10/2020   LEFT HEART CATH AND CORONARY ANGIOGRAPHY N/A 01/16/2018   Procedure: LEFT HEART CATH AND CORONARY ANGIOGRAPHY;  Surgeon: Yvonne Kendall, MD;  Location: [facility];  Service: Cardiovascular;  Laterality: N/A;   SHOULDER ARTHROSCOPY WITH ROTATOR CUFF REPAIR AND SUBACROMIAL DECOMPRESSION Left 11/12/2022   Procedure: SHOULDER ARTHROSCOPY WITH ROTATOR CUFF DEBBRIDEMENT AND SUBACROMIAL DECOMPRESSION;  Surgeon: Jones Broom, MD;  Location: [facility];  Service: Orthopedics;  Laterality: Left;   UPPER GASTROINTESTINAL ENDOSCOPY  07/2021   Social History   Socioeconomic History   Marital status: Married    Spouse name: Not on file   Number of children: 2   Years of education: Not on file   Highest education level: Bachelor's degree (e.g., BA, AB, BS)  Occupational History   Occupation: Teacher, adult education: [employer]  Tobacco Use   Smoking status: Never    Passive exposure: Never   Smokeless tobacco: Never  Vaping Use   Vaping status: Never Used  Substance and Sexual Activity   Alcohol use: Yes    Alcohol/week: 6.0 standard drinks of alcohol    Types: 5 Glasses of wine, 1 Cans of beer per week    Comment: 1 per day   Drug use: No   Sexual activity: Yes    Partners: Male  Other  Topics Concern   Not on file  Social History Narrative   Marital status/children/pets: 2 children.    Education/employment: [employer]:      -Wears a bicycle helmet riding a bike: Yes     -smoke alarm in the home:Yes     - wears seatbelt: Yes     - Feels safe in their relationships: Yes      Social Determinants of Health   Financial Resource Strain: Low Risk  (01/27/2023)   Overall Financial Resource Strain (CARDIA)    Difficulty of Paying Living Expenses: Not hard at all  Food Insecurity: No Food Insecurity (01/27/2023)   Hunger Vital Sign    Worried About Running Out of Food in the Last Year: Never true    Ran Out of Food in the Last Year: Never true  Transportation Needs: No Transportation Needs (01/27/2023)   PRAPARE - Administrator, Civil Service (Medical): No    Lack of Transportation (Non-Medical): No  Physical Activity: Insufficiently Active (01/27/2023)   Exercise Vital Sign    Days of Exercise per Week: 2 days    Minutes of Exercise per Session: 60 min  Stress: Stress Concern Present (01/27/2023)   [employer] of Occupational Health - Occupational Stress Questionnaire    Feeling of Stress : To some extent  Social Connections: Socially Integrated (01/27/2023)   Social Connection and Isolation Panel [NHANES]    Frequency of Communication with Friends and Family: Three times a week    Frequency of Social Gatherings with Friends and Family: Once a week    Attends Religious Services: More than 4 times per year    Active Member of [employer] or Organizations: Yes    Attends Engineer, structural: More than 4 times per year    Marital Status: Married   Allergies  Allergen Reactions   Lipitor [Atorvastatin Calcium] Other (See Comments)    Memory issue; myalgia   Celebrex [Celecoxib] Rash   Family History  Problem Relation Age of Onset   Hypertension Mother    Diabetes Mother    Diverticulitis Mother    Heart failure Mother    Hyperlipidemia Father     Diabetes Father    Heart attack Father    Arrhythmia Father    Sleep apnea Father    Heart attack Sister    Obesity Sister    Arthritis Sister    Asthma Sister    Alcohol abuse Brother    Stroke Maternal Uncle    Pancreatic cancer Maternal Uncle    Leukemia Maternal Grandmother    Diabetes Maternal Grandfather    Diabetes Paternal Grandmother    Heart attack Paternal Grandfather    Heart disease Paternal Grandfather    Migraines Daughter    Liver disease Other        Paternal great aunt   Breast cancer Neg Hx    Colon cancer Neg Hx  Esophageal cancer Neg Hx    Inflammatory bowel disease Neg Hx    Rectal cancer Neg Hx    Stomach cancer Neg Hx     Current Outpatient Medications (Endocrine & Metabolic):    estradiol (VIVELLE-DOT) 0.025 MG/24HR, Apply 1 patch twice a week by transdermal route.   progesterone (PROMETRIUM) 100 MG capsule, Take 1 capsule (100 mg total) by mouth at bedtime AND increase to 2 capsules (200 mg total) at bedtime prior to cycle    Current Outpatient Medications (Analgesics):    aspirin EC 81 MG tablet, Take 1 tablet by mouth every other day.   Current Outpatient Medications (Other):    Cholecalciferol (VITAMIN D3) 25 MCG/SPRAY LIQD, Take 250 mcg by mouth daily.   FLUoxetine (PROZAC) 10 MG capsule, Take 1 capsule (10mg  total) by mouth every other day alternating with 2 capsules (20mg  total) by mouth every other day.   gabapentin (NEURONTIN) 100 MG capsule, Take 2 capsules (200 mg total) by mouth 2 (two) times daily. (Patient taking differently: Take 200 mg by mouth as needed.)   hydrOXYzine (ATARAX) 10 MG tablet, Take 1-2 tablets (10-20 mg total) by mouth 3 (three) times daily as needed for itching.   Reviewed prior external information including notes and imaging from  primary care provider As well as notes that were available from care everywhere and other healthcare systems.  Past medical history, social, surgical and family history all  reviewed in electronic medical record.  No pertanent information unless stated regarding to the chief complaint.   Review of Systems:  No headache, visual changes, nausea, vomiting, diarrhea, constipation, dizziness, abdominal pain, skin rash, fevers, chills, night sweats, weight loss, swollen lymph nodes, body aches, joint swelling, chest pain, shortness of breath, mood changes. POSITIVE muscle aches  Objective  Last menstrual period 11/29/2021.   General: No apparent distress alert and oriented x3 mood and affect normal, dressed appropriately.  HEENT: Pupils equal, extraocular movements intact  Respiratory: Patient's speak in full sentences and does not appear short of breath  Cardiovascular: No lower extremity edema, non tender, no erythema      Impression and Recommendations:    The above documentation has been reviewed and is accurate and complete Judi Saa, DO

## 2023-08-06 ENCOUNTER — Other Ambulatory Visit: Payer: Self-pay

## 2023-08-06 ENCOUNTER — Ambulatory Visit (INDEPENDENT_AMBULATORY_CARE_PROVIDER_SITE_OTHER): Payer: 59 | Admitting: Family Medicine

## 2023-08-06 VITALS — BP 112/70 | HR 70 | Ht 62.0 in

## 2023-08-06 DIAGNOSIS — M25512 Pain in left shoulder: Secondary | ICD-10-CM

## 2023-08-06 DIAGNOSIS — M25511 Pain in right shoulder: Secondary | ICD-10-CM | POA: Diagnosis not present

## 2023-08-06 NOTE — Patient Instructions (Signed)
Injection in both AC joints Ice 20 mins 2x a day Keep hands in peripheral vision Try external rotatation with wrist at work See you again in 2-3 months

## 2023-08-07 ENCOUNTER — Encounter: Payer: Self-pay | Admitting: Family Medicine

## 2023-08-07 DIAGNOSIS — M25511 Pain in right shoulder: Secondary | ICD-10-CM | POA: Insufficient documentation

## 2023-08-07 NOTE — Assessment & Plan Note (Signed)
Postsurgical changes noted on the left side and acute effusion noted on the right side.  Given injections bilaterally.  Hopeful that this will make significant difference.  Discussed keeping hands within peripheral vision.  Continue to stay active otherwise.  Discussed topical anti-inflammatories and icing regimen.  Follow-up again in 6 to 8 weeks

## 2023-08-08 NOTE — Progress Notes (Signed)
Attending Physician's Attestation   I have reviewed the chart.   I agree with the Advanced Practitioner's note, impression, and recommendations with any updates as below.    Emersyn Wyss Mansouraty, MD [facility] Office # [phone]  

## 2023-08-11 ENCOUNTER — Other Ambulatory Visit (HOSPITAL_COMMUNITY): Payer: 59

## 2023-08-18 ENCOUNTER — Ambulatory Visit (HOSPITAL_COMMUNITY)
Admission: RE | Admit: 2023-08-18 | Discharge: 2023-08-18 | Disposition: A | Discharge status: Home / Self Care | Payer: 59 | Source: Ambulatory Visit | Attending: Nurse Practitioner | Admitting: Nurse Practitioner

## 2023-08-18 DIAGNOSIS — R932 Abnormal findings on diagnostic imaging of liver and biliary tract: Secondary | ICD-10-CM | POA: Diagnosis not present

## 2023-08-18 DIAGNOSIS — K5900 Constipation, unspecified: Secondary | ICD-10-CM | POA: Diagnosis not present

## 2023-08-18 DIAGNOSIS — R790 Abnormal level of blood mineral: Secondary | ICD-10-CM | POA: Diagnosis not present

## 2023-08-18 DIAGNOSIS — R142 Eructation: Secondary | ICD-10-CM | POA: Diagnosis not present

## 2023-08-18 DIAGNOSIS — R1010 Upper abdominal pain, unspecified: Secondary | ICD-10-CM | POA: Insufficient documentation

## 2023-08-18 MED ORDER — IOHEXOL 300 MG/ML  SOLN
100.0000 mL | Freq: Once | INTRAMUSCULAR | Status: AC | PRN
  Administered 2023-08-18: 100 mL via INTRAVENOUS

## 2023-08-18 MED ORDER — BARIUM SULFATE 0.1 % PO SUSP
1350.0000 mL | Freq: Once | ORAL | Status: AC
  Administered 2023-08-18: 1350 mL via ORAL

## 2023-08-18 MED ORDER — BARIUM SULFATE 0.1 % PO SUSP
ORAL | Status: AC
  Filled 2023-08-18: qty 3

## 2023-09-02 ENCOUNTER — Ambulatory Visit (INDEPENDENT_AMBULATORY_CARE_PROVIDER_SITE_OTHER): Payer: 59 | Admitting: Sports Medicine

## 2023-09-02 ENCOUNTER — Other Ambulatory Visit (HOSPITAL_BASED_OUTPATIENT_CLINIC_OR_DEPARTMENT_OTHER): Payer: Self-pay

## 2023-09-02 VITALS — Ht 62.0 in

## 2023-09-02 DIAGNOSIS — M545 Low back pain, unspecified: Secondary | ICD-10-CM

## 2023-09-02 DIAGNOSIS — G8929 Other chronic pain: Secondary | ICD-10-CM

## 2023-09-02 DIAGNOSIS — M533 Sacrococcygeal disorders, not elsewhere classified: Secondary | ICD-10-CM | POA: Diagnosis not present

## 2023-09-02 DIAGNOSIS — M25571 Pain in right ankle and joints of right foot: Secondary | ICD-10-CM

## 2023-09-02 MED ORDER — METHYLPREDNISOLONE 4 MG PO TBPK
ORAL_TABLET | ORAL | 0 refills | Status: DC
  Filled 2023-09-02: qty 21, 6d supply, fill #0

## 2023-09-02 NOTE — Patient Instructions (Addendum)
Prednisone prescribed Do prescribed exercises at least 3x a week Follow up in 2-3 weeks

## 2023-09-02 NOTE — Addendum Note (Signed)
Addended by: Jerene Canny R on: 09/02/2023 12:57 PM   Modules accepted: Orders

## 2023-09-02 NOTE — Progress Notes (Signed)
[name] [address] [place] [address] Phone: [phone]   Assessment and Plan:     1. Lumbar spine pain 2. Chronic right-sided low back pain without sciatica 3. Chronic left SI joint pain 4. Acute right ankle pain  -Chronic with exacerbation, initial sports medicine visit - Flare of low back pain with new acute right ankle pain after fall.  Most consistent with flare of chronic pains in low back and left SI joint, with acute strain of right foot dorsiflexors including anterior tibialis and toe extensors - Negative [place] ankle rules and no bony tenderness in lumbar spine, so no imaging at today's visit - Patient did not notice improvement with one-time NSAID use, and has not had significant NSAID use with other musculoskeletal issues in the past, so we decided against NSAID course - Start prednisone Dosepak.  Prescription provided - Start HEP focusing on lumbar spine and SI joints  15 additional minutes spent for educating Therapeutic Home Exercise Program.  This included exercises focusing on stretching, strengthening, with focus on eccentric aspects.   Long term goals include an improvement in range of motion, strength, endurance as well as avoiding reinjury. Patient's frequency would include in 1-2 times a day, 3-5 times a week for a duration of 6-12 weeks. Proper technique shown and discussed handout in great detail with ATC.  All questions were discussed and answered.    Pertinent previous records reviewed include none  Follow Up: 2 to 3 weeks for reevaluation.  Could consider OMT which was deferred today due to acute tenderness.  Could consider x-ray at areas of remaining pain   Subjective:   I, Michele [name], am serving as a Neurosurgeon for Doctor Richardean Sale  Chief Complaint: right ankle and low back pain   HPI:   09/02/23 Patient is a 57 year old female with concerns of right ankle and low back pain. Patient  states that she was walking down her stairs in the dark and missed the last step, straining right ankle, falling on left low back and right wrist.  No significant relief with one-time use NSAIDs.  Was not able to bear weight on right foot and ankle for the first 4 hours, however afterwards has been able to rock without significant pain   Relevant Historical Information: GERD  Additional pertinent review of systems negative.   Current Outpatient Medications:    aspirin EC 81 MG tablet, Take 1 tablet by mouth every other day., Disp: , Rfl:    Cholecalciferol (VITAMIN D3) 25 MCG/SPRAY LIQD, Take 250 mcg by mouth daily., Disp: , Rfl:    estradiol (VIVELLE-DOT) 0.025 MG/24HR, Apply 1 patch twice a week by transdermal route., Disp: 24 patch, Rfl: 3   FLUoxetine (PROZAC) 10 MG capsule, Take 1 capsule (10mg  total) by mouth every other day alternating with 2 capsules (20mg  total) by mouth every other day., Disp: 135 capsule, Rfl: 3   gabapentin (NEURONTIN) 100 MG capsule, Take 2 capsules (200 mg total) by mouth 2 (two) times daily. (Patient taking differently: Take 200 mg by mouth as needed.), Disp: 180 capsule, Rfl: 0   hydrOXYzine (ATARAX) 10 MG tablet, Take 1-2 tablets (10-20 mg total) by mouth 3 (three) times daily as needed for itching., Disp: 30 tablet, Rfl: 2   progesterone (PROMETRIUM) 100 MG capsule, Take 1 capsule (100 mg total) by mouth at bedtime AND increase to 2 capsules (200 mg total) at bedtime prior to cycle, Disp: 180 capsule,  Rfl: 0   Objective:     Vitals:   09/02/23 1124  Height: 5\' 2"  (1.575 m)      Body mass index is 24.6 kg/m.    Physical Exam:    Gen: Appears well, nad, nontoxic and pleasant Psych: Alert and oriented, appropriate mood and affect Neuro: sensation intact, strength is 5/5 in upper and lower extremities, muscle tone wnl Skin: no susupicious lesions or rashes  Back - Normal skin, Spine with normal alignment and no deformity.   No tenderness to vertebral  process palpation.   Paraspinous muscles are not tender and without spasm TTP left SI joint NTTP gluteal musculature Straight leg raise negative Trendelenberg negative Piriformis Test negative Gait normal  No pain with lumbar flexion, rotation, extension    Right foot/ankle:  No deformity, no swelling or effusion NTTP over fibular head, lat mal, medial mal, achilles, navicular, base of 5th, ATFL, CFL, deltoid, calcaneous or midfoot ROM DF 30, PF 45, inv/ev intact Negative ant drawer, talar tilt, rotation test, squeeze test. Neg thompson No pain with resisted inversion or eversion  Pain over anterior ankle and along anterior tibialis tendon.  Pain along anterior ankle with resisted foot dorsiflexion  Electronically signed by:  Michele [name] [name] 12:03 PM 09/02/23

## 2023-09-08 DIAGNOSIS — L570 Actinic keratosis: Secondary | ICD-10-CM | POA: Diagnosis not present

## 2023-09-08 DIAGNOSIS — L820 Inflamed seborrheic keratosis: Secondary | ICD-10-CM | POA: Diagnosis not present

## 2023-09-16 ENCOUNTER — Ambulatory Visit: Payer: 59 | Admitting: Sports Medicine

## 2023-09-18 ENCOUNTER — Other Ambulatory Visit: Payer: Self-pay | Admitting: Family Medicine

## 2023-09-21 MED ORDER — GABAPENTIN 100 MG PO CAPS
200.0000 mg | ORAL_CAPSULE | Freq: Two times a day (BID) | ORAL | 0 refills | Status: DC
  Filled 2023-09-21: qty 180, 45d supply, fill #0

## 2023-09-22 ENCOUNTER — Other Ambulatory Visit (HOSPITAL_BASED_OUTPATIENT_CLINIC_OR_DEPARTMENT_OTHER): Payer: Self-pay

## 2023-09-30 NOTE — Progress Notes (Unsigned)
[name] [address] [place] [address] Phone: [phone] Subjective:    I'm seeing this patient by the request  of:  [name], [name], DO  CC:   [id number]  08/06/2023 Postsurgical changes noted on the left side and acute effusion noted on the right side.  Given injections bilaterally.  Hopeful that this will make significant difference.  Discussed keeping hands within peripheral vision.  Continue to stay active otherwise.  Discussed topical anti-inflammatories and icing regimen.  Follow-up again in 6 to 8 weeks     Updated 10/01/2023 Michele [name] is a 57 y.o. female coming in with complaint of shoulder pain. Saw Dr. Jean Rosenthal recently for back and ankle pain    Previous MRI of the lumbar spine shows a patient Degenerative disc disease of 3 months.  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   DDD (degenerative disc disease), cervical    Depression    Dorsalgia    Glaucoma    Hypercholesterolemia    Intestinal gas excretion    Memory loss 01/13/2020   Migraine 08/17/2020   Nonallopathic lesion of cervical region 01/23/2021   l areas are chronic   Patient tolerated the procedure well with improvement in symptoms  Patient given exercises, stretches and lifestyle modifications  See medications in patient instructions if given  Patient will follow up in 4-8 weeks   Peptic ulcer    Scapular dyskinesis 01/23/2021   Scarlet fever    Sequelae of other specified infectious and parasitic diseases 11/20/2021   Skin cancer    Tick bite 02/22/2018   UTI (urinary tract infection)    Yeast infection    Past Surgical History:  Procedure Laterality Date   BASAL CELL CARCINOMA EXCISION     BREAST BIOPSY  Left 1992   Duct removal (No scar seen)    BREAST EXCISIONAL BIOPSY Left 1992   COLONOSCOPY  07/2021   DACRORHINOCYSTOTOMY  10/2020   LEFT HEART CATH AND CORONARY ANGIOGRAPHY N/A 01/16/2018   Procedure: LEFT HEART CATH AND CORONARY ANGIOGRAPHY;  Surgeon: Yvonne Kendall, MD;  Location: [facility];  Service: Cardiovascular;  Laterality: N/A;   SHOULDER ARTHROSCOPY WITH ROTATOR CUFF REPAIR AND SUBACROMIAL DECOMPRESSION Left 11/12/2022   Procedure: SHOULDER ARTHROSCOPY WITH ROTATOR CUFF DEBBRIDEMENT AND SUBACROMIAL DECOMPRESSION;  Surgeon: Jones Broom, MD;  Location: [facility];  Service: Orthopedics;  Laterality: Left;   UPPER GASTROINTESTINAL ENDOSCOPY  07/2021   Social History   Socioeconomic History   Marital status: Married    Spouse name: Not on file   Number of children: 2   Years of education: Not on file   Highest education level: Bachelor's degree (e.g., BA, AB, BS)  Occupational History   Occupation: Teacher, 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    Education/employment: [employer]      -Wears a bicycle helmet riding a bike: Yes     -smoke alarm in the home:Yes     - wears seatbelt: Yes     - Feels safe in their relationships: Yes      Social Determinants of Health   Financial Resource Strain: Low Risk  (01/27/2023)   Overall Financial Resource Strain (CARDIA)    Difficulty of Paying Living Expenses: Not hard at all  Food Insecurity: No Food Insecurity (01/27/2023)   Hunger Vital Sign     Worried About Running Out of Food in the Last Year: Never true    Ran Out of Food in the Last Year: Never true  Transportation Needs: No Transportation Needs (01/27/2023)   PRAPARE - Administrator, Civil Service (Medical): No    Lack of Transportation (Non-Medical): No  Physical Activity: Insufficiently Active (01/27/2023)   Exercise Vital Sign    Days of Exercise per Week: 2 days    Minutes of Exercise per Session: 60 min  Stress: Stress Concern Present (01/27/2023)   [employer] of Occupational Health - Occupational Stress Questionnaire    Feeling of Stress : To some extent  Social Connections: Socially Integrated (01/27/2023)   Social Connection and Isolation Panel [NHANES]    Frequency of Communication with Friends and Family: Three times a week    Frequency of Social Gatherings with Friends and Family: Once a week    Attends Religious Services: More than 4 times per year    Active Member of [employer] or Organizations: Yes    Attends Engineer, structural: More than 4 times per year    Marital Status: Married   Allergies  Allergen Reactions   Lipitor [Atorvastatin Calcium] Other (See Comments)    Memory issue; myalgia   Celebrex [Celecoxib] Rash   Family History  Problem Relation Age of Onset   Hypertension Mother    Diabetes Mother    Diverticulitis Mother    Heart failure Mother    Hyperlipidemia Father    Diabetes Father    Heart attack Father    Arrhythmia Father    Sleep apnea Father    Heart attack Sister    Obesity Sister    Arthritis Sister    Asthma Sister    Alcohol abuse Brother    Stroke Maternal Uncle    Pancreatic cancer Maternal Uncle    Leukemia Maternal Grandmother    Diabetes Maternal Grandfather    Diabetes Paternal Grandmother    Heart attack Paternal Grandfather    Heart disease Paternal Grandfather    Migraines Daughter    Liver disease Other        Paternal great aunt   Breast cancer Neg Hx    Colon cancer Neg Hx     Esophageal cancer Neg Hx    Inflammatory bowel disease Neg Hx    Rectal cancer Neg Hx    Stomach cancer Neg Hx     Current Outpatient Medications (Endocrine & Metabolic):    estradiol (VIVELLE-DOT) 0.025 MG/24HR, Apply 1 patch twice a week by transdermal route.   methylPREDNISolone (MEDROL DOSEPAK) 4 MG TBPK tablet, Take as directed on pack for 6 days.   progesterone (PROMETRIUM) 100 MG capsule, Take 1 capsule (100 mg  total) by mouth at bedtime AND increase to 2 capsules (200 mg total) at bedtime prior to cycle    Current Outpatient Medications (Analgesics):    aspirin EC 81 MG tablet, Take 1 tablet by mouth every other day.   Current Outpatient Medications (Other):    Cholecalciferol (VITAMIN D3) 25 MCG/SPRAY LIQD, Take 250 mcg by mouth daily.   FLUoxetine (PROZAC) 10 MG capsule, Take 1 capsule (10mg  total) by mouth every other day alternating with 2 capsules (20mg  total) by mouth every other day.   gabapentin (NEURONTIN) 100 MG capsule, Take 2 capsules (200 mg total) by mouth 2 (two) times daily.   hydrOXYzine (ATARAX) 10 MG tablet, Take 1-2 tablets (10-20 mg total) by mouth 3 (three) times daily as needed for itching.   Reviewed prior external information including notes and imaging from  primary care provider As well as notes that were available from care everywhere and other healthcare systems.  Past medical history, social, surgical and family history all reviewed in electronic medical record.  No pertanent information unless stated regarding to the chief complaint.   Review of Systems:  No headache, visual changes, nausea, vomiting, diarrhea, constipation, dizziness, abdominal pain, skin rash, fevers, chills, night sweats, weight loss, swollen lymph nodes, body aches, joint swelling, chest pain, shortness of breath, mood changes. POSITIVE muscle aches  Objective  Last menstrual period 11/29/2021.   General: No apparent distress alert and oriented x3 mood and affect normal,  dressed appropriately.  HEENT: Pupils equal, extraocular movements intact  Respiratory: Patient's speak in full sentences and does not appear short of breath  Cardiovascular: No lower extremity edema, non tender, no erythema  Low back exam shows  Osteopathic findings Cervical C2 flexed rotated and side bent right C4 flexed rotated and side bent left C6 flexed rotated and side bent left T3 extended rotated and side bent right inhaled third rib T9 extended rotated and side bent left L2 flexed rotated and side bent right Sacrum right on right     Impression and Recommendations:     No problem-specific Assessment & Plan notes found for this encounter.     Decision today to treat with OMT was based on Physical Exam  After verbal consent patient was treated with HVLA, ME, FPR techniques in cervical, thoracic, rib, lumbar and sacral areas, all areas are chronic   Patient tolerated the procedure well with improvement in symptoms  Patient given exercises, stretches and lifestyle modifications  See medications in patient instructions if given  Patient will follow up in 4-8 weeks  The above documentation has been reviewed and is accurate and complete Judi Saa, DO

## 2023-10-01 ENCOUNTER — Ambulatory Visit: Payer: 59 | Admitting: Family Medicine

## 2023-10-06 NOTE — Progress Notes (Addendum)
[name] [address] [place] [address] Phone: [phone] Subjective:    I'm seeing this patient by the request  of:  [name], [name], DO  CC: Low back pain acutely worsening  [id number]  08/06/2023 Postsurgical changes noted on the left side and acute effusion noted on the right side. Given injections bilaterally. Hopeful that this will make significant difference. Discussed keeping hands within peripheral vision. Continue to stay active otherwise. Discussed topical anti-inflammatories and icing regimen. Follow-up again in 6 to 8 weeks   Updated 10/07/2023 Michele [name] is a 57 y.o. female coming in with complaint of shoulder pain, slipped ribs, reassess fall back in Nov. Doing better after [facility] appointment, but hurting today on low left side of back. AC joint injection helped right. 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   DDD (degenerative disc disease), cervical    Depression    Dorsalgia    Glaucoma    Hypercholesterolemia    Intestinal gas excretion    Memory loss 01/13/2020   Migraine 08/17/2020   Nonallopathic lesion of cervical region 01/23/2021   l areas are chronic   Patient tolerated the procedure well with improvement in symptoms  Patient given exercises, stretches and lifestyle modifications  See medications in patient instructions if given  Patient will follow up in 4-8 weeks   Peptic ulcer    Scapular dyskinesis 01/23/2021   Scarlet fever    Sequelae of other specified infectious and parasitic diseases 11/20/2021   Skin cancer    Tick bite 02/22/2018   UTI (urinary tract infection)    Yeast infection    Past Surgical  History:  Procedure Laterality Date   BASAL CELL CARCINOMA EXCISION     BREAST BIOPSY Left 1992   Duct removal (No scar seen)    BREAST EXCISIONAL BIOPSY Left 1992   COLONOSCOPY  07/2021   DACRORHINOCYSTOTOMY  10/2020   LEFT HEART CATH AND CORONARY ANGIOGRAPHY N/A 01/16/2018   Procedure: LEFT HEART CATH AND CORONARY ANGIOGRAPHY;  Surgeon: Yvonne Kendall, MD;  Location: [facility];  Service: Cardiovascular;  Laterality: N/A;   SHOULDER ARTHROSCOPY WITH ROTATOR CUFF REPAIR AND SUBACROMIAL DECOMPRESSION Left 11/12/2022   Procedure: SHOULDER ARTHROSCOPY WITH ROTATOR CUFF DEBBRIDEMENT AND SUBACROMIAL DECOMPRESSION;  Surgeon: Jones Broom, MD;  Location: [facility];  Service: Orthopedics;  Laterality: Left;   UPPER GASTROINTESTINAL ENDOSCOPY  07/2021   Social History   Socioeconomic History   Marital status: Married    Spouse name: Not on file   Number of children: 2   Years of education: Not on file   Highest education level: Bachelor's degree (e.g., BA, AB, BS)  Occupational History   Occupation: Teacher, adult education: [employer]  Tobacco Use   Smoking status: Never    Passive exposure: Never   Smokeless tobacco: Never  Vaping Use   Vaping status: Never Used  Substance and Sexual Activity   Alcohol use: Yes    Alcohol/week: 6.0 standard drinks of alcohol    Types: 5 Glasses of wine, 1 Cans of beer per week  Comment: 1 per day   Drug use: No   Sexual activity: Yes    Partners: Male  Other Topics Concern   Not on file  Social History Narrative   Marital status/children/pets: 2 children.    Education/employment: [employer]      -Wears a bicycle helmet riding a bike: Yes     -smoke alarm in the home:Yes     - wears seatbelt: Yes     - Feels safe in their relationships: Yes      Social Drivers of Corporate investment banker Strain: Low Risk  (01/27/2023)   Overall Financial Resource Strain (CARDIA)    Difficulty of Paying Living Expenses: Not hard  at all  Food Insecurity: No Food Insecurity (01/27/2023)   Hunger Vital Sign    Worried About Running Out of Food in the Last Year: Never true    Ran Out of Food in the Last Year: Never true  Transportation Needs: No Transportation Needs (01/27/2023)   PRAPARE - Administrator, Civil Service (Medical): No    Lack of Transportation (Non-Medical): No  Physical Activity: Insufficiently Active (01/27/2023)   Exercise Vital Sign    Days of Exercise per Week: 2 days    Minutes of Exercise per Session: 60 min  Stress: Stress Concern Present (01/27/2023)   [employer] of Occupational Health - Occupational Stress Questionnaire    Feeling of Stress : To some extent  Social Connections: Socially Integrated (01/27/2023)   Social Connection and Isolation Panel [NHANES]    Frequency of Communication with Friends and Family: Three times a week    Frequency of Social Gatherings with Friends and Family: Once a week    Attends Religious Services: More than 4 times per year    Active Member of [employer] or Organizations: Yes    Attends Engineer, structural: More than 4 times per year    Marital Status: Married   Allergies  Allergen Reactions   Lipitor [Atorvastatin Calcium] Other (See Comments)    Memory issue; myalgia   Celebrex [Celecoxib] Rash   Family History  Problem Relation Age of Onset   Hypertension Mother    Diabetes Mother    Diverticulitis Mother    Heart failure Mother    Hyperlipidemia Father    Diabetes Father    Heart attack Father    Arrhythmia Father    Sleep apnea Father    Heart attack Sister    Obesity Sister    Arthritis Sister    Asthma Sister    Alcohol abuse Brother    Stroke Maternal Uncle    Pancreatic cancer Maternal Uncle    Leukemia Maternal Grandmother    Diabetes Maternal Grandfather    Diabetes Paternal Grandmother    Heart attack Paternal Grandfather    Heart disease Paternal Grandfather    Migraines Daughter    Liver disease Other         Paternal great aunt   Breast cancer Neg Hx    Colon cancer Neg Hx    Esophageal cancer Neg Hx    Inflammatory bowel disease Neg Hx    Rectal cancer Neg Hx    Stomach cancer Neg Hx     Current Outpatient Medications (Endocrine & Metabolic):    estradiol (VIVELLE-DOT) 0.025 MG/24HR, Apply 1 patch twice a week by transdermal route.   methylPREDNISolone (MEDROL DOSEPAK) 4 MG TBPK tablet, Take as directed on pack for 6 days.   progesterone (PROMETRIUM)  100 MG capsule, Take 1 capsule (100 mg total) by mouth at bedtime AND increase to 2 capsules (200 mg total) at bedtime prior to cycle    Current Outpatient Medications (Analgesics):    aspirin EC 81 MG tablet, Take 1 tablet by mouth every other day.   Current Outpatient Medications (Other):    Cholecalciferol (VITAMIN D3) 25 MCG/SPRAY LIQD, Take 250 mcg by mouth daily.   FLUoxetine (PROZAC) 10 MG capsule, Take 1 capsule (10mg  total) by mouth every other day alternating with 2 capsules (20mg  total) by mouth every other day.   gabapentin (NEURONTIN) 100 MG capsule, Take 2 capsules (200 mg total) by mouth 2 (two) times daily.   hydrOXYzine (ATARAX) 10 MG tablet, Take 1-2 tablets (10-20 mg total) by mouth 3 (three) times daily as needed for itching.   Reviewed prior external information including notes and imaging from  primary care provider As well as notes that were available from care everywhere and other healthcare systems.  Past medical history, social, surgical and family history all reviewed in electronic medical record.  No pertanent information unless stated regarding to the chief complaint.   Review of Systems:  No headache, visual changes, nausea, vomiting, diarrhea, constipation, dizziness, abdominal pain, skin rash, fevers, chills, night sweats, weight loss, swollen lymph nodes, body aches, joint swelling, chest pain, shortness of breath, mood changes. POSITIVE muscle aches  Objective  Blood pressure 112/74, pulse 86,  height 1' (0.305 m), weight 140 lb (63.5 kg), last menstrual period 11/29/2021, SpO2 97%.   General: No apparent distress alert and oriented x3 mood and affect normal, dressed appropriately.  HEENT: Pupils equal, extraocular movements intact  Respiratory: Patient's speak in full sentences and does not appear short of breath  Cardiovascular: No lower extremity edema, non tender, no erythema  Low back exam does have some loss lordosis noted.  Tightness noted with extension.  Severe tenderness in the L5-S1 area.  Feels better with patient doing a flexion position ensuring extension.  Pain is significantly out of proportion.   After verbal consent patient was prepped with alcohol swab and with a 21-gauge 2 inch needle injected in the sacroiliac joints bilaterally.  Total of 1 cc 0.5% Marcaine and 2 cc of Kenalog 40 mg/mL used.  No blood loss.  Band-Aids placed.  Postinjection instructions given.    Patient did have x-rays today and it shows an acute change noted at the L5-S1 area with a degenerative disc seems to be significantly worse than when comparing to the MRI from 5 months ago.   Impression and Recommendations:    The above documentation has been reviewed and is accurate and complete Judi Saa, DO

## 2023-10-07 ENCOUNTER — Ambulatory Visit (INDEPENDENT_AMBULATORY_CARE_PROVIDER_SITE_OTHER): Payer: 59

## 2023-10-07 ENCOUNTER — Ambulatory Visit: Payer: 59 | Admitting: Family Medicine

## 2023-10-07 ENCOUNTER — Other Ambulatory Visit: Payer: Self-pay

## 2023-10-07 ENCOUNTER — Encounter: Payer: Self-pay | Admitting: Family Medicine

## 2023-10-07 VITALS — BP 112/74 | HR 86 | Ht <= 58 in | Wt 140.0 lb

## 2023-10-07 DIAGNOSIS — Z043 Encounter for examination and observation following other accident: Secondary | ICD-10-CM | POA: Diagnosis not present

## 2023-10-07 DIAGNOSIS — M25512 Pain in left shoulder: Secondary | ICD-10-CM

## 2023-10-07 DIAGNOSIS — G8929 Other chronic pain: Secondary | ICD-10-CM | POA: Diagnosis not present

## 2023-10-07 DIAGNOSIS — M51362 Other intervertebral disc degeneration, lumbar region with discogenic back pain and lower extremity pain: Secondary | ICD-10-CM

## 2023-10-07 DIAGNOSIS — M545 Low back pain, unspecified: Secondary | ICD-10-CM

## 2023-10-07 DIAGNOSIS — M5136 Other intervertebral disc degeneration, lumbar region with discogenic back pain only: Secondary | ICD-10-CM | POA: Diagnosis not present

## 2023-10-07 DIAGNOSIS — M533 Sacrococcygeal disorders, not elsewhere classified: Secondary | ICD-10-CM | POA: Diagnosis not present

## 2023-10-07 NOTE — Assessment & Plan Note (Addendum)
Severe acute change of the L5/S1 on xray from [facility] MRI. Concern for an acute possible compression fracture or acute herniated disc.  Attempted to sacroiliac joint injections that did not seem to make any significant improvement.  Patient unable to tolerate significant amount of osteopathic manipulation today as well.  Discussed different medications but do not know if anything would make a drastic difference.  Has been on prednisone recently as well.  Depending on what the imaging shows we will discuss the possibility of other treatment options such as injections.

## 2023-10-07 NOTE — Patient Instructions (Addendum)
Injection in SI joint today  MRI because you have acute change at L5/S1 joint

## 2023-10-08 ENCOUNTER — Encounter: Payer: Self-pay | Admitting: Family Medicine

## 2023-10-08 ENCOUNTER — Other Ambulatory Visit: Payer: Self-pay | Admitting: Family Medicine

## 2023-10-08 DIAGNOSIS — M545 Low back pain, unspecified: Secondary | ICD-10-CM

## 2023-10-08 DIAGNOSIS — M51362 Other intervertebral disc degeneration, lumbar region with discogenic back pain and lower extremity pain: Secondary | ICD-10-CM

## 2023-10-08 DIAGNOSIS — M25512 Pain in left shoulder: Secondary | ICD-10-CM

## 2023-10-08 DIAGNOSIS — G8929 Other chronic pain: Secondary | ICD-10-CM

## 2023-10-11 ENCOUNTER — Ambulatory Visit: Payer: 59

## 2023-10-11 DIAGNOSIS — M5136 Other intervertebral disc degeneration, lumbar region with discogenic back pain only: Secondary | ICD-10-CM | POA: Diagnosis not present

## 2023-10-11 DIAGNOSIS — D1809 Hemangioma of other sites: Secondary | ICD-10-CM | POA: Diagnosis not present

## 2023-10-11 DIAGNOSIS — M545 Low back pain, unspecified: Secondary | ICD-10-CM

## 2023-10-13 ENCOUNTER — Encounter: Payer: Self-pay | Admitting: Family Medicine

## 2023-10-17 ENCOUNTER — Other Ambulatory Visit: Payer: Self-pay | Admitting: Family Medicine

## 2023-10-17 ENCOUNTER — Other Ambulatory Visit (HOSPITAL_BASED_OUTPATIENT_CLINIC_OR_DEPARTMENT_OTHER): Payer: Self-pay

## 2023-10-17 ENCOUNTER — Other Ambulatory Visit: Payer: Self-pay

## 2023-10-17 MED ORDER — GABAPENTIN 100 MG PO CAPS
200.0000 mg | ORAL_CAPSULE | Freq: Two times a day (BID) | ORAL | 0 refills | Status: DC
  Filled 2023-10-17 – 2023-11-05 (×2): qty 180, 45d supply, fill #0

## 2023-10-17 MED ORDER — PROGESTERONE MICRONIZED 100 MG PO CAPS
ORAL_CAPSULE | ORAL | 0 refills | Status: DC
  Filled 2023-10-17: qty 180, 90d supply, fill #0

## 2023-10-30 NOTE — Progress Notes (Signed)
 [facility] [address] [place] [address] Phone: [phone] Subjective:   Michele [name], am serving as a scribe for Dr. Arthea Claudene.  I'm seeing this patient by the request  of:  [name], [name], DO  CC: Bilateral shoulder pain  YEP:[id number]  10/07/2023 Severe acute change of the L5/S1 on xray from [facility] MRI. Concern for an acute possible compression fracture or acute herniated disc.  Attempted to sacroiliac joint injections that did not seem to make any significant improvement.  Patient unable to tolerate significant amount of osteopathic manipulation today as well.  Discussed different medications but do not know if anything would make a drastic difference.  Has been on prednisone  recently as well.  Depending on what the imaging shows we will discuss the possibility of other treatment options such as injections.      Update 11/03/2023 Michele [name] is a 58 y.o. female coming in with complaint of B shoulder pain. Patient states that L>R. Pain over top of shoulder that radiates into middle deltoid. Painful to sleep on sides and when she is driving.      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   DDD (degenerative disc disease), cervical    Depression    Dorsalgia    Glaucoma    Hypercholesterolemia    Intestinal gas excretion    Memory loss 01/13/2020   Migraine 08/17/2020   Nonallopathic lesion of cervical region 01/23/2021   l areas are chronic   Patient tolerated the procedure well with improvement in symptoms  Patient given exercises, stretches and lifestyle modifications  See medications in patient instructions if given  Patient will follow up in 4-8 weeks   Peptic ulcer    Scapular dyskinesis  01/23/2021   Scarlet fever    Sequelae of other specified infectious and parasitic diseases 11/20/2021   Skin cancer    Tick bite 02/22/2018   UTI (urinary tract infection)    Yeast infection    Past Surgical History:  Procedure Laterality Date   BASAL CELL CARCINOMA EXCISION     BREAST BIOPSY Left 1992   Duct removal (No scar seen)    BREAST EXCISIONAL BIOPSY Left 1992   COLONOSCOPY  07/2021   DACRORHINOCYSTOTOMY  10/2020   LEFT HEART CATH AND CORONARY ANGIOGRAPHY N/A 01/16/2018   Procedure: LEFT HEART CATH AND CORONARY ANGIOGRAPHY;  Surgeon: Mady Bruckner, MD;  Location: [facility];  Service: Cardiovascular;  Laterality: N/A;   SHOULDER ARTHROSCOPY WITH ROTATOR CUFF REPAIR AND SUBACROMIAL DECOMPRESSION Left 11/12/2022   Procedure: SHOULDER ARTHROSCOPY WITH ROTATOR CUFF DEBBRIDEMENT AND SUBACROMIAL DECOMPRESSION;  Surgeon: Dozier Soulier, MD;  Location: [facility];  Service: Orthopedics;  Laterality: Left;   UPPER GASTROINTESTINAL ENDOSCOPY  07/2021   Social History   Socioeconomic History   Marital status: Married    Spouse name: Not on file   Number of children: 2   Years of education: Not on file   Highest education level: Bachelor's degree (e.g., BA, AB, BS)  Occupational History   Occupation: Teacher, 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    Education/employment: [employer]      -Wears a bicycle helmet riding a bike: Yes     -smoke alarm in the home:Yes     - wears seatbelt: Yes     -  Feels safe in their relationships: Yes      Social Drivers of Corporate Investment Banker Strain: Low Risk  (01/27/2023)   Overall Financial Resource Strain (CARDIA)    Difficulty of Paying Living Expenses: Not hard at all  Food Insecurity: No Food Insecurity (01/27/2023)   Hunger Vital Sign    Worried About Running Out of Food in the Last Year: Never true    Ran Out of Food in the Last Year: Never true  Transportation Needs: No Transportation Needs (01/27/2023)   PRAPARE - Administrator, Civil Service (Medical): No    Lack of Transportation (Non-Medical): No  Physical Activity: Insufficiently Active (01/27/2023)   Exercise Vital Sign    Days of Exercise per Week: 2 days    Minutes of Exercise per Session: 60 min  Stress: Stress Concern Present (01/27/2023)   [employer] of Occupational Health - Occupational Stress Questionnaire    Feeling of Stress : To some extent  Social Connections: Socially Integrated (01/27/2023)   Social Connection and Isolation Panel [NHANES]    Frequency of Communication with Friends and Family: Three times a week    Frequency of Social Gatherings with Friends and Family: Once a week    Attends Religious Services: More than 4 times per year    Active Member of [employer] or Organizations: Yes    Attends Engineer, Structural: More than 4 times per year    Marital Status: Married   Allergies  Allergen Reactions   Lipitor [Atorvastatin Calcium] Other (See Comments)    Memory issue; myalgia   Celebrex [Celecoxib] Rash   Family History  Problem Relation Age of Onset   Hypertension Mother    Diabetes Mother    Diverticulitis Mother    Heart failure Mother    Hyperlipidemia Father    Diabetes Father    Heart attack Father    Arrhythmia Father    Sleep apnea Father    Heart attack Sister    Obesity Sister    Arthritis Sister    Asthma Sister    Alcohol abuse Brother    Stroke Maternal Uncle    Pancreatic cancer Maternal Uncle     Leukemia Maternal Grandmother    Diabetes Maternal Grandfather    Diabetes Paternal Grandmother    Heart attack Paternal Grandfather    Heart disease Paternal Grandfather    Migraines Daughter    Liver disease Other        Paternal great aunt   Breast cancer Neg Hx    Colon cancer Neg Hx    Esophageal cancer Neg Hx    Inflammatory bowel disease Neg Hx    Rectal cancer Neg Hx    Stomach cancer Neg Hx  Current Outpatient Medications (Endocrine & Metabolic):    estradiol  (VIVELLE -DOT) 0.025 MG/24HR, Apply 1 patch twice a week by transdermal route.   methylPREDNISolone  (MEDROL  DOSEPAK) 4 MG TBPK tablet, Take as directed on pack for 6 days.   progesterone  (PROMETRIUM ) 100 MG capsule, Take 1 capsule (100 mg total) by mouth at bedtime and increase to 2 capsules (200 mg total) at bedtime prior to cycle    Current Outpatient Medications (Analgesics):    aspirin  EC 81 MG tablet, Take 1 tablet by mouth every other day.   Current Outpatient Medications (Other):    Cholecalciferol (VITAMIN D3) 25 MCG/SPRAY LIQD, Take 250 mcg by mouth daily.   FLUoxetine  (PROZAC ) 10 MG capsule, Take 1 capsule (10mg  total) by mouth every other day alternating with 2 capsules (20mg  total) by mouth every other day.   gabapentin  (NEURONTIN ) 100 MG capsule, Take 2 capsules (200 mg total) by mouth 2 (two) times daily.   hydrOXYzine  (ATARAX ) 10 MG tablet, Take 1-2 tablets (10-20 mg total) by mouth 3 (three) times daily as needed for itching.   Reviewed prior external information including notes and imaging from  primary care provider As well as notes that were available from care everywhere and other healthcare systems.  Past medical history, social, surgical and family history all reviewed in electronic medical record.  No pertanent information unless stated regarding to the chief complaint.   Review of Systems:  No headache, visual changes, nausea, vomiting, diarrhea, constipation, dizziness, abdominal pain,  skin rash, fevers, chills, night sweats, weight loss, swollen lymph nodes, body aches, joint swelling, chest pain, shortness of breath, mood changes. POSITIVE muscle aches  Objective  Blood pressure 104/68, pulse 82, height 5' 3 (1.6 m), weight 140 lb (63.5 kg), last menstrual period 11/29/2021, SpO2 94%.   General: No apparent distress alert and oriented x3 mood and affect normal, dressed appropriately.  HEENT: Pupils equal, extraocular movements intact  Respiratory: Patient's speak in full sentences and does not appear short of breath  Cardiovascular: No lower extremity edema, non tender, no erythema  Neck exam does have some mild loss lordosis.  Some tenderness to palpation of the acromioclavicular joint. Tightness noted in the right scapular area.  Limited muscular skeletal ultrasound was performed and interpreted by CLAUDENE HUSSAR, M  Limited ultrasound shows a patient does have some narrowing of the acromioclavicular joint and right left greater than right.  Patient does have some hypoechoic changes noted but still significantly improved from previous exam.  Osteopathic findings C2 flexed rotated and side bent right T3 extended rotated and side bent right inhaled third rib    Impression and Recommendations:     The above documentation has been reviewed and is accurate and complete Hussar CHRISTELLA Claudene, DO  Cervical disc disorder with radiculopathy of cervical region Concerned that some of it could be secondary to more of a cervical radiculopathy.  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  Responded extremely well to osteopathic manipulation.  Ultrasound today did not show significant swelling over the acromioclavicular joint which is significantly improved with the injections previously and not likely contributing to the discomfort and pain patient is having at the moment.    Decision today to treat with OMT was based on Physical Exam  After verbal consent patient  was treated with HVLA, ME, FPR techniques in cervical, thoracic, rib areas, all areas are chronic   Patient tolerated the procedure well with improvement in symptoms  Patient given exercises, stretches and  lifestyle modifications  See medications in patient instructions if given  Patient will follow up in 4-8 weeks

## 2023-11-03 ENCOUNTER — Ambulatory Visit (INDEPENDENT_AMBULATORY_CARE_PROVIDER_SITE_OTHER): Payer: 59 | Admitting: Family Medicine

## 2023-11-03 ENCOUNTER — Other Ambulatory Visit: Payer: Self-pay

## 2023-11-03 ENCOUNTER — Encounter: Payer: Self-pay | Admitting: Family Medicine

## 2023-11-03 VITALS — BP 104/68 | HR 82 | Ht 63.0 in | Wt 140.0 lb

## 2023-11-03 DIAGNOSIS — M9902 Segmental and somatic dysfunction of thoracic region: Secondary | ICD-10-CM

## 2023-11-03 DIAGNOSIS — M25512 Pain in left shoulder: Secondary | ICD-10-CM | POA: Diagnosis not present

## 2023-11-03 DIAGNOSIS — M9908 Segmental and somatic dysfunction of rib cage: Secondary | ICD-10-CM

## 2023-11-03 DIAGNOSIS — M9901 Segmental and somatic dysfunction of cervical region: Secondary | ICD-10-CM

## 2023-11-03 DIAGNOSIS — M5010 Cervical disc disorder with radiculopathy, unspecified cervical region: Secondary | ICD-10-CM | POA: Diagnosis not present

## 2023-11-03 DIAGNOSIS — M25511 Pain in right shoulder: Secondary | ICD-10-CM | POA: Diagnosis not present

## 2023-11-03 NOTE — Assessment & Plan Note (Signed)
 Concerned that some of it could be secondary to more of a cervical radiculopathy.  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  Responded extremely well to osteopathic manipulation.  Ultrasound today did not show significant swelling over the acromioclavicular joint which is significantly improved with the injections previously and not likely contributing to the discomfort and pain patient is having at the moment.

## 2023-11-03 NOTE — Patient Instructions (Signed)
See me again in 6-8 weeks ?

## 2023-11-05 ENCOUNTER — Encounter: Payer: Self-pay | Admitting: Urgent Care

## 2023-11-05 ENCOUNTER — Ambulatory Visit: Payer: 59 | Admitting: Urgent Care

## 2023-11-05 ENCOUNTER — Other Ambulatory Visit (HOSPITAL_BASED_OUTPATIENT_CLINIC_OR_DEPARTMENT_OTHER): Payer: Self-pay

## 2023-11-05 VITALS — BP 106/73 | HR 83 | Temp 98.4°F | Wt 138.0 lb

## 2023-11-05 DIAGNOSIS — J22 Unspecified acute lower respiratory infection: Secondary | ICD-10-CM

## 2023-11-05 DIAGNOSIS — R059 Cough, unspecified: Secondary | ICD-10-CM | POA: Diagnosis not present

## 2023-11-05 DIAGNOSIS — R0981 Nasal congestion: Secondary | ICD-10-CM | POA: Diagnosis not present

## 2023-11-05 LAB — POCT INFLUENZA A/B
Influenza A, POC: NEGATIVE
Influenza B, POC: NEGATIVE

## 2023-11-05 LAB — POC COVID19 BINAXNOW: SARS Coronavirus 2 Ag: NEGATIVE

## 2023-11-05 MED ORDER — AZITHROMYCIN 250 MG PO TABS
ORAL_TABLET | ORAL | 0 refills | Status: AC
  Filled 2023-11-05: qty 6, 5d supply, fill #0

## 2023-11-05 MED ORDER — FLUCONAZOLE 150 MG PO TABS
ORAL_TABLET | ORAL | 0 refills | Status: DC
  Filled 2023-11-05: qty 2, 3d supply, fill #0

## 2023-11-05 MED ORDER — AMOXICILLIN-POT CLAVULANATE 875-125 MG PO TABS
1.0000 | ORAL_TABLET | Freq: Two times a day (BID) | ORAL | 0 refills | Status: AC
  Filled 2023-11-05: qty 14, 7d supply, fill #0

## 2023-11-05 NOTE — Patient Instructions (Signed)
 You have a lower respiratory tract infection. Your flu and covid tests are negative.  Please start taking the antibiotics as prescribed. - Take Augmentin  twice daily with food. Take it for all 7 days, do not stop early just because you feel better. Take an over the counter probiotic (Florastor) and yogurt daily to help prevent diarrhea or vaginal yeast infection. - Take Azithromycin  per package directions (2 today followed by one daily for the next four days)  Drink PLENTY of water!  Use mucinex (guaifenesin) 600mg  (WITHOUT sudafed or cough suppressant) daily to help with removing mucous and phlegm. You can get this over the counter.  It is also recommended that you use nasal saline/ sinus washes to cleans the sinus passages.  Continue tylenol  and ibuprofen as needed. You can do 600mg  ibuprofen (3 OTC tabs) every 6 hours - take with food.  Hot steam from a shower or vaporizer may also be beneficial to help open up the upper airway. Eucalyptus can be helpful.  If any worsening symptoms such as continued headache, development of fever, or shortness of breath, please return to office for recheck.

## 2023-11-05 NOTE — Progress Notes (Signed)
 Established Patient Office Visit  Subjective:  Patient ID: Michele [name], female    DOB: 11-30-1965  Age: 58 y.o. MRN: [id number]  Chief Complaint  Patient presents with   Headache    Headache and ear pain since last week. Yesterday she has had an increase in ear pain, bad cough and nasal congestion. She has tried Tylenol  and Benadryl which has not helped much.   Discussed the use of AI software for clinical note transcription with the patient who gave verbal consent to proceed.   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 Initial symptoms were mild, including a sensation of "swimming" in the head and increased need to swallow due to increased mucus production. Despite these symptoms, the patient was able to continue normal activities, including going to the gym, over the weekend.  However, the patient's condition significantly worsened by Monday morning. Both ears were hurting severely, with the right ear experiencing more intense pain. The patient also reported a severe headache and increased congestion. The cough became more frequent and sounded worse to the patient. Nasal discharge was clear but increased in volume, and the patient described feeling both congested and runny. Despite these symptoms, the patient denied having a fever or body aches.  The patient reported pain in the chest and throat when coughing, and occasionally brought up some mucus, though it was not colored or bloody. The patient denied experiencing shortness of breath or wheezing. Over-the-counter treatments included alternating doses of Tylenol  and ibuprofen, and Benadryl at night. The patient's daily prescription medications include gabapentin , fluoxetine , and progesterone . The patient denied using estradiol  or testosterone  cream.  The patient reported no history of  recurrent sinus issues or ear infections and does not smoke. The patient also reported having her ribs adjusted recently, and suspected that they may be out of place again, contributing to the chest pain. The patient denied any other individuals in her home being sick.    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 06/30/2022   Facial pain, atypical 06/30/2022   Nerve compression syndrome 06/30/2022   Chiari malformation type I (HCC) 06/30/2022   Pain in left shoulder 06/25/2022   Cervical disc disorder with radiculopathy of cervical region 06/03/2022   Neuropathy 10/30/2020   Left-sided trigeminal neuralgia 10/30/2020   Anxiety 08/22/2020   Hyperlipidemia 08/22/2020   Unstable angina (HCC)    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   DDD (degenerative disc disease), cervical    Depression    Dorsalgia    Glaucoma    Hypercholesterolemia    Intestinal gas excretion    Memory loss 01/13/2020   Migraine 08/17/2020   Nonallopathic lesion of cervical region 01/23/2021  l areas are chronic   Patient tolerated the procedure well with improvement in symptoms  Patient given exercises, stretches and lifestyle modifications  See medications in patient instructions if given  Patient will follow up in 4-8 weeks   Peptic ulcer    Scapular  dyskinesis 01/23/2021   Scarlet fever    Sequelae of other specified infectious and parasitic diseases 11/20/2021   Skin cancer    Tick bite 02/22/2018   UTI (urinary tract infection)    Yeast infection    Past Surgical History:  Procedure Laterality Date   BASAL CELL CARCINOMA EXCISION     BREAST BIOPSY Left 1992   Duct removal (No scar seen)    BREAST EXCISIONAL BIOPSY Left 1992   COLONOSCOPY  07/2021   DACRORHINOCYSTOTOMY  10/2020   LEFT HEART CATH AND CORONARY ANGIOGRAPHY N/A 01/16/2018   Procedure: LEFT HEART CATH AND CORONARY ANGIOGRAPHY;  Surgeon: Sammy Crisp, MD;  Location: [facility];  Service: Cardiovascular;  Laterality: N/A;   SHOULDER ARTHROSCOPY WITH ROTATOR CUFF REPAIR AND SUBACROMIAL DECOMPRESSION Left 11/12/2022   Procedure: SHOULDER ARTHROSCOPY WITH ROTATOR CUFF DEBBRIDEMENT AND SUBACROMIAL DECOMPRESSION;  Surgeon: Sammye Cristal, MD;  Location: [facility];  Service: Orthopedics;  Laterality: Left;   UPPER GASTROINTESTINAL ENDOSCOPY  07/2021   Social History   Tobacco Use   Smoking status: Never    Passive exposure: Never   Smokeless tobacco: Never  Vaping Use   Vaping status: Never Used  Substance Use Topics   Alcohol use: Yes    Alcohol/week: 6.0 standard drinks of alcohol    Types: 5 Glasses of wine, 1 Cans of beer per week    Comment: 1 per day   Drug use: No      ROS: as noted in HPI  Objective:     BP 106/73   Pulse 83   Temp 98.4 F (36.9 C) (Oral)   Wt 138 lb (62.6 kg)   LMP 11/29/2021   SpO2 98%   BMI 24.45 kg/m  BP Readings from Last 3 Encounters:  11/05/23 106/73  11/03/23 104/68  10/07/23 112/74   Wt Readings from Last 3 Encounters:  11/05/23 138 lb (62.6 kg)  11/03/23 140 lb (63.5 kg)  10/07/23 140 lb (63.5 kg)      Physical Exam Vitals and nursing note reviewed. Exam conducted with a chaperone present (husband).  Constitutional:      General: She is not in acute distress.    Appearance:  Normal appearance. She is ill-appearing. She is not toxic-appearing or diaphoretic.  HENT:     Head: Normocephalic and atraumatic.     Jaw: There is normal jaw occlusion.     Salivary Glands: Right salivary gland is not diffusely enlarged or tender. Left salivary gland is not diffusely enlarged or tender.      Right Ear: No drainage, swelling or tenderness. No middle ear effusion. There is no impacted cerumen. No mastoid tenderness. Tympanic membrane is not injected, scarred, perforated, erythematous or bulging. Tympanic membrane has decreased mobility.     Left Ear: No drainage, swelling or tenderness.  No middle ear effusion. There is no impacted cerumen. No mastoid tenderness. Tympanic membrane is not injected, scarred, perforated, erythematous or bulging.     Nose: Nose normal. No congestion or rhinorrhea.     Right Turbinates: Not enlarged or swollen.     Left Turbinates: Not enlarged or swollen.     Right Sinus: No maxillary sinus tenderness or frontal sinus  tenderness.     Left Sinus: No maxillary sinus tenderness or frontal sinus tenderness.     Mouth/Throat:     Lips: Pink.     Mouth: Mucous membranes are moist.     Pharynx: Oropharynx is clear. Uvula midline. Posterior oropharyngeal erythema present. No pharyngeal swelling, oropharyngeal exudate, uvula swelling or postnasal drip.     Tonsils: No tonsillar exudate or tonsillar abscesses.  Cardiovascular:     Rate and Rhythm: Normal rate and regular rhythm.     Pulses: Normal pulses.     Heart sounds: No murmur heard.    No friction rub. No gallop.  Pulmonary:     Effort: Pulmonary effort is normal. No respiratory distress.     Breath sounds: No stridor. Rhonchi (significant rhonchi noted to all posterior lung fields on R; negative on L) present. No wheezing or rales.  Chest:     Chest wall: Tenderness (mild reproducible tenderness to manubrium/ superior sternum) present.  Musculoskeletal:     Cervical back: Normal range of  motion and neck supple. No rigidity or tenderness.  Lymphadenopathy:     Cervical: Cervical adenopathy (mild anterior cervical lymph chain at stated area of discomfort) present.  Skin:    General: Skin is warm and dry.     Coloration: Skin is not jaundiced.     Findings: No bruising, erythema or rash.  Neurological:     General: No focal deficit present.     Mental Status: She is alert and oriented to person, place, and time.     Cranial Nerves: No cranial nerve deficit.     Motor: No weakness.  Psychiatric:        Mood and Affect: Mood normal.        Behavior: Behavior normal.      Results for orders placed or performed in visit on 11/05/23  POCT Influenza A/B  Result Value Ref Range   Influenza A, POC Negative Negative   Influenza B, POC Negative Negative  POC COVID-19 BinaxNow  Result Value Ref Range   SARS Coronavirus 2 Ag Negative Negative    Last CBC Lab Results  Component Value Date   WBC 4.8 08/04/2023   HGB 13.5 08/04/2023   HCT 41.2 08/04/2023   MCV 94.2 08/04/2023   MCH 31.7 05/06/2023   RDW 14.9 08/04/2023   PLT 314.0 08/04/2023   Last metabolic panel Lab Results  Component Value Date   GLUCOSE 97 08/04/2023   NA 136 08/04/2023   K 4.5 08/04/2023   CL 103 08/04/2023   CO2 27 08/04/2023   BUN 10 08/04/2023   CREATININE 0.80 08/04/2023   GFR 80.77 08/04/2023   CALCIUM 9.6 08/04/2023   PROT 6.8 08/04/2023   ALBUMIN 4.1 08/04/2023   BILITOT 0.6 08/04/2023   ALKPHOS 55 08/04/2023   AST 16 08/04/2023   ALT 10 08/04/2023   ANIONGAP 9 05/06/2023      The 10-year ASCVD risk score (Arnett DK, et al., 2019) is: 1.8%  Assessment & Plan:  Acute lower respiratory infection -     Azithromycin ; Take 2 tablets (500 mg total) by mouth daily for 1 day, THEN 1 tablet (250 mg total) daily for 4 days.  Dispense: 6 tablet; Refill: 0 -     Amoxicillin -Pot Clavulanate; Take 1 tablet by mouth 2 (two) times daily with a meal for 7 days.  Dispense: 14 tablet;  Refill: 0 -     Fluconazole ; Take one tablet by mouth at first sign of  yeast infection. May repeat in 72 hours if needed  Dispense: 2 tablet; Refill: 0  Cough, unspecified type -     POCT Influenza A/B -     POC COVID-19 BinaxNow  Nasal congestion -     POCT Influenza A/B -     POC COVID-19 BinaxNow   Assessment and Plan Upper Respiratory Infection Symptoms started with mild head congestion, progressed to severe earache, headache, increased cough, and nasal congestion. No fever or body aches. No shortness of breath or wheezing. Some productive cough with unclear color of mucus. Examination revealed abnormal rhonchi in the right lung, suggestive of possible mycoplasma pneumonia. No signs of flu or COVID-19; negative in office testing. -Start combination therapy with Augmentin  for 7 days and Azithromycin  for 5 days. -Continue ibuprofen for pain and discomfort. -Provide two doses of Diflucan  to prevent yeast infection, to be taken on day 3 and at the end of the antibiotic course. -Consider over-the-counter probiotic Florastor to restore normal flora disrupted by antibiotics. -Check response to treatment in 48 hours. If no improvement, consider chest x-ray. -Provide work note for 2 days off, with potential extension if symptoms persist.    No follow-ups on file.   Mandy Second, PA

## 2023-11-06 ENCOUNTER — Ambulatory Visit: Payer: 59 | Admitting: Urgent Care

## 2023-11-26 ENCOUNTER — Ambulatory Visit: Payer: 59 | Admitting: Family Medicine

## 2023-12-15 ENCOUNTER — Other Ambulatory Visit: Payer: Self-pay

## 2023-12-15 ENCOUNTER — Other Ambulatory Visit (HOSPITAL_BASED_OUTPATIENT_CLINIC_OR_DEPARTMENT_OTHER): Payer: Self-pay

## 2023-12-15 ENCOUNTER — Ambulatory Visit: Payer: 59 | Admitting: Family Medicine

## 2023-12-15 DIAGNOSIS — Z6824 Body mass index (BMI) 24.0-24.9, adult: Secondary | ICD-10-CM | POA: Diagnosis not present

## 2023-12-15 DIAGNOSIS — Z01419 Encounter for gynecological examination (general) (routine) without abnormal findings: Secondary | ICD-10-CM | POA: Diagnosis not present

## 2023-12-15 DIAGNOSIS — N959 Unspecified menopausal and perimenopausal disorder: Secondary | ICD-10-CM | POA: Diagnosis not present

## 2023-12-15 DIAGNOSIS — Z124 Encounter for screening for malignant neoplasm of cervix: Secondary | ICD-10-CM | POA: Diagnosis not present

## 2023-12-15 DIAGNOSIS — N952 Postmenopausal atrophic vaginitis: Secondary | ICD-10-CM | POA: Diagnosis not present

## 2023-12-15 MED ORDER — PROGESTERONE MICRONIZED 100 MG PO CAPS
100.0000 mg | ORAL_CAPSULE | Freq: Every day | ORAL | 3 refills | Status: AC
  Filled 2023-12-15 – 2024-03-01 (×2): qty 180, 90d supply, fill #0
  Filled 2024-03-22: qty 90, 90d supply, fill #0
  Filled 2024-06-17: qty 90, 90d supply, fill #1
  Filled 2024-08-28: qty 90, 90d supply, fill #2
  Filled 2024-11-25: qty 90, 90d supply, fill #3

## 2023-12-15 MED ORDER — ESTRADIOL 0.025 MG/24HR TD PTTW
1.0000 | MEDICATED_PATCH | TRANSDERMAL | 4 refills | Status: DC
  Filled 2023-12-15 – 2024-02-16 (×4): qty 24, 84d supply, fill #0
  Filled 2024-05-03: qty 24, 84d supply, fill #1

## 2023-12-25 ENCOUNTER — Other Ambulatory Visit (HOSPITAL_BASED_OUTPATIENT_CLINIC_OR_DEPARTMENT_OTHER): Payer: Self-pay

## 2023-12-29 DIAGNOSIS — D1801 Hemangioma of skin and subcutaneous tissue: Secondary | ICD-10-CM | POA: Insufficient documentation

## 2023-12-29 DIAGNOSIS — M1990 Unspecified osteoarthritis, unspecified site: Secondary | ICD-10-CM | POA: Insufficient documentation

## 2023-12-29 DIAGNOSIS — L814 Other melanin hyperpigmentation: Secondary | ICD-10-CM | POA: Insufficient documentation

## 2023-12-29 DIAGNOSIS — D225 Melanocytic nevi of trunk: Secondary | ICD-10-CM | POA: Insufficient documentation

## 2024-02-02 ENCOUNTER — Other Ambulatory Visit (HOSPITAL_BASED_OUTPATIENT_CLINIC_OR_DEPARTMENT_OTHER): Payer: Self-pay

## 2024-02-16 ENCOUNTER — Other Ambulatory Visit: Payer: Self-pay

## 2024-02-16 ENCOUNTER — Other Ambulatory Visit (HOSPITAL_BASED_OUTPATIENT_CLINIC_OR_DEPARTMENT_OTHER): Payer: Self-pay

## 2024-03-01 ENCOUNTER — Other Ambulatory Visit: Payer: Self-pay | Admitting: Family Medicine

## 2024-03-01 DIAGNOSIS — M12819 Other specific arthropathies, not elsewhere classified, unspecified shoulder: Secondary | ICD-10-CM | POA: Insufficient documentation

## 2024-03-01 DIAGNOSIS — L821 Other seborrheic keratosis: Secondary | ICD-10-CM | POA: Insufficient documentation

## 2024-03-02 ENCOUNTER — Other Ambulatory Visit (HOSPITAL_BASED_OUTPATIENT_CLINIC_OR_DEPARTMENT_OTHER): Payer: Self-pay

## 2024-03-03 ENCOUNTER — Other Ambulatory Visit (HOSPITAL_BASED_OUTPATIENT_CLINIC_OR_DEPARTMENT_OTHER): Payer: Self-pay

## 2024-03-03 ENCOUNTER — Encounter (HOSPITAL_BASED_OUTPATIENT_CLINIC_OR_DEPARTMENT_OTHER): Payer: Self-pay | Admitting: Pharmacist

## 2024-03-23 ENCOUNTER — Other Ambulatory Visit (HOSPITAL_BASED_OUTPATIENT_CLINIC_OR_DEPARTMENT_OTHER): Payer: Self-pay

## 2024-04-06 ENCOUNTER — Other Ambulatory Visit: Payer: Self-pay | Admitting: Family Medicine

## 2024-04-07 ENCOUNTER — Other Ambulatory Visit (HOSPITAL_BASED_OUTPATIENT_CLINIC_OR_DEPARTMENT_OTHER): Payer: Self-pay

## 2024-04-07 MED ORDER — GABAPENTIN 100 MG PO CAPS
200.0000 mg | ORAL_CAPSULE | Freq: Two times a day (BID) | ORAL | 0 refills | Status: DC
  Filled 2024-04-07: qty 180, 45d supply, fill #0

## 2024-04-22 ENCOUNTER — Other Ambulatory Visit (HOSPITAL_BASED_OUTPATIENT_CLINIC_OR_DEPARTMENT_OTHER): Payer: Self-pay

## 2024-04-22 MED ORDER — ESTRADIOL 1 MG PO TABS
1.0000 mg | ORAL_TABLET | Freq: Every day | ORAL | 7 refills | Status: DC
  Filled 2024-04-22: qty 30, 30d supply, fill #0

## 2024-04-28 ENCOUNTER — Encounter (HOSPITAL_BASED_OUTPATIENT_CLINIC_OR_DEPARTMENT_OTHER): Payer: Self-pay | Admitting: Emergency Medicine

## 2024-04-28 ENCOUNTER — Other Ambulatory Visit: Payer: Self-pay

## 2024-04-28 ENCOUNTER — Emergency Department (HOSPITAL_BASED_OUTPATIENT_CLINIC_OR_DEPARTMENT_OTHER)
Admission: EM | Admit: 2024-04-28 | Discharge: 2024-04-28 | Disposition: A | Discharge status: Home / Self Care | Payer: Worker's Compensation | Source: Ambulatory Visit | Attending: Emergency Medicine | Admitting: Emergency Medicine

## 2024-04-28 DIAGNOSIS — Y990 Civilian activity done for income or pay: Secondary | ICD-10-CM | POA: Insufficient documentation

## 2024-04-28 DIAGNOSIS — M25531 Pain in right wrist: Secondary | ICD-10-CM | POA: Insufficient documentation

## 2024-04-28 DIAGNOSIS — W19XXXA Unspecified fall, initial encounter: Secondary | ICD-10-CM | POA: Diagnosis not present

## 2024-04-28 DIAGNOSIS — M25511 Pain in right shoulder: Secondary | ICD-10-CM | POA: Diagnosis present

## 2024-04-28 DIAGNOSIS — Z7982 Long term (current) use of aspirin: Secondary | ICD-10-CM | POA: Insufficient documentation

## 2024-04-28 DIAGNOSIS — M545 Low back pain, unspecified: Secondary | ICD-10-CM | POA: Insufficient documentation

## 2024-04-28 HISTORY — DX: Ileus, unspecified: K56.7

## 2024-04-28 NOTE — ED Triage Notes (Signed)
 Pt via pov from work with injuries after a fall at work. Pt states she didn't hit the floor but hit a recliner. She has pain in both shoulders, neck, and right hand and wrist. 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 Pt denies hitting head or LOC. Pt alert & oriented, NAD noted.

## 2024-04-28 NOTE — Discharge Instructions (Addendum)
 Evaluation is overall reassuring please follow-up your PCP.  As we discussed if you develop saddle numbness, urinary or bowel incontinence or retention, fever or new trauma to your back or any other concerning symptom please return to the ED for further evaluation.

## 2024-04-28 NOTE — ED Provider Notes (Signed)
 [facility] Provider Note   CSN: [id number] Arrival date & time: 04/28/24  1639     Patient presents with: Shoulder Injury and Wrist Injury  HPI Michele [name] is a 58 y.o. female with hyperlipidemia, lumbar degenerative disc disease, Chiari malformation type I, migraines presenting for fall.  Occurred at 11 AM today at work.  She is a Engineer, civil (consulting).  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  Denies hitting the floor.  Denies head injury or loss of consciousness.  Now reporting right shoulder and wrist pain.  Also reporting pain in the lower back.  She states she always has pain in the lower back but slightly worse after the fall.  Denies saddle anesthesia, urinary or bowel changes, fever.  She is ambulatory and able to bear weight.  Denies any notable swelling or bruising in her right arm.  Patient states that she took Tylenol  and ibuprofen before arrival and her symptoms have improved somewhat since then.  States she is primarily here because she was encouraged by her staff to be evaluated.    Shoulder Injury  Wrist Injury      Prior to Admission medications   Medication Sig Start Date End Date Taking? Authorizing Provider  aspirin  EC 81 MG tablet Take 1 tablet by mouth every other day.    [provider]  Cholecalciferol (VITAMIN D3) 25 MCG/SPRAY LIQD Take 250 mcg by mouth daily.    [provider]  estradiol  (ESTRACE ) 1 MG tablet Take 1 tablet (1 mg total) by mouth daily. 04/22/24     estradiol  (VIVELLE -DOT) 0.025 MG/24HR Apply 1 patch twice a week by transdermal route. 06/24/23     estradiol  (VIVELLE -DOT) 0.025 MG/24HR Place 1 patch onto the skin 2 (two) times a week. 12/15/23     fluconazole  (DIFLUCAN ) 150 MG tablet Take one tablet by mouth at first sign of yeast infection. May repeat in 72 hours if needed 11/05/23   Crain, Whitney L, PA   FLUoxetine  (PROZAC ) 10 MG capsule Take 1 capsule (10mg  total) by mouth every other day alternating with 2 capsules (20mg  total) by mouth every other day. 10/28/22   [name], [name], DO  gabapentin  (NEURONTIN ) 100 MG capsule Take 2 capsules (200 mg total) by mouth 2 (two) times daily. 04/07/24   Claudene Arthea HERO, DO  hydrOXYzine  (ATARAX ) 10 MG tablet Take 1-2 tablets (10-20 mg total) by mouth 3 (three) times daily as needed for itching. 07/20/23     methylPREDNISolone  (MEDROL  DOSEPAK) 4 MG TBPK tablet Take as directed on pack for 6 days. Patient not taking: Reported on 11/05/2023 09/02/23   Leonce Katz, DO  progesterone  (PROMETRIUM ) 100 MG capsule Take 1 capsule (100 mg total) by mouth at bedtime. AND increase to 2 capsules (200 mg total) at bedtime prior to cycle. 12/15/23       Allergies: Lipitor [atorvastatin calcium] and Celebrex [celecoxib]    Review of Systems See HPI  Updated Vital Signs BP 107/73 (BP Location: Left Arm)   Pulse 64   Temp 97.9 F (36.6 C)   Resp 17   Ht 5' 3 (1.6 m)   Wt 62.6 kg   LMP 11/29/2021   SpO2 100%   BMI 24.45 kg/m    Physical Exam   Vitals:   04/28/24 1647  BP: 107/73  Pulse: 64  Resp: 17  Temp: 97.9 F (36.6 C)  SpO2: 100%    CONSTITUTIONAL: well-appearing, NAD NEURO: Alert and oriented x 3, CN 3-12 grossly intact EYES: eyes equal and reactive ENT/NECK:  Supple, no stridor CARDIO:  Regular rate and rhythm, appears well-perfused, radial pulses are 2+ bilaterally PULM:  No respiratory distress, CTAB GI/GU:  non-distended, soft, non tender MSK/SPINE:  No gross deformities, no edema, moves all extremities.  Right wrist without erythema or edema, range of motion is normal.  Right shoulder also normal appearing, no swelling or erythema.  Range of motion is normal as well.  SKIN:  no rash, atraumatic  *Additional and/or pertinent findings included in MDM below  (all labs ordered are listed, but only abnormal results are displayed) Labs  Reviewed - No data to display  EKG: None  Radiology: No results found.   Procedures   Medications Ordered in the ED - No data to display                                  Medical Decision Making  58 year old well-appearing female presenting for mechanical fall at work.  Exam was unremarkable.  Given how well she appears with no evidence of trauma, normal range of motion of her limbs and back, feel that imaging is not warranted at this time.  We discussed the utility of imaging and she agreed that she would be mindful of her symptoms at home and if anything was worse she would return for further evaluation.  Discussed that she follow-up with her PCP and treat conservatively for what is likely soft tissue injury.  Discharged in good condition.     Final diagnoses:  Fall, initial encounter    ED Discharge Orders     None          Lang Norleen POUR, PA-C 04/28/24 1731    Geraldene Hamilton, MD 04/29/24 [phone]

## 2024-04-29 ENCOUNTER — Telehealth: Payer: Self-pay

## 2024-04-29 NOTE — Transitions of Care (Post Inpatient/ED Visit) (Signed)
   04/29/2024  Name: Michele [name] MRN: [id number] DOB: 1966/10/02  Today's TOC FU Call Status: Today's TOC FU Call Status:: Successful TOC FU Call Completed TOC FU Call Complete Date: 04/29/24 Patient's Name and Date of Birth confirmed.  Transition Care Management Follow-up Telephone Call Date of Discharge: 04/28/24 Discharge Facility: [facility] ([facility]) Type of Discharge: Emergency Department Reason for ED Visit: Other: How have you been since you were released from the hospital?: Better Any questions or concerns?: No  Items Reviewed: Did you receive and understand the discharge instructions provided?: Yes Medications obtained,verified, and reconciled?: Yes (Medications Reviewed) Any new allergies since your discharge?: No Dietary orders reviewed?: NA Do you have support at home?: Yes People in Home [RPT]: spouse  Medications Reviewed Today: Medications Reviewed Today     Reviewed by Claudene Shanda ORN, CMA (Certified Medical Assistant) on 04/29/24 at 1133  Med List Status: <None>   Medication Order Taking? Sig Documenting Provider Last Dose Status Informant  aspirin  EC 81 MG tablet [id number]  Take 1 tablet by mouth every other day. [provider]  Active   Cholecalciferol (VITAMIN D3) 25 MCG/SPRAY LIQD [id number]  Take 250 mcg by mouth daily. [provider]  Active   estradiol  (ESTRACE ) 1 MG tablet [id number]  Take 1 tablet (1 mg total) by mouth daily.   Active   estradiol  (VIVELLE -DOT) 0.025 MG/24HR [id number]  Apply 1 patch twice a week by transdermal route.   Active   estradiol  (VIVELLE -DOT) 0.025 MG/24HR [id number]  Place 1 patch onto the skin 2 (two) times a week.   Active   fluconazole  (DIFLUCAN ) 150 MG tablet [id number]  Take one tablet by mouth at first sign of yeast infection. May repeat in 72 hours if needed Crain, Whitney L, [place]  Active   FLUoxetine  (PROZAC ) 10 MG capsule [id number]  Take 1 capsule (10mg  total) by mouth every other day alternating  with 2 capsules (20mg  total) by mouth every other day. [name], [name], DO  Active   gabapentin  (NEURONTIN ) 100 MG capsule [id number]  Take 2 capsules (200 mg total) by mouth 2 (two) times daily. Smith, Zachary M, DO  Active   hydrOXYzine  (ATARAX ) 10 MG tablet [id number]  Take 1-2 tablets (10-20 mg total) by mouth 3 (three) times daily as needed for itching.   Active   methylPREDNISolone  (MEDROL  DOSEPAK) 4 MG TBPK tablet [id number]  Take as directed on pack for 6 days.  Patient not taking: Reported on 11/05/2023   Leonce Katz, DO  Active   progesterone  (PROMETRIUM ) 100 MG capsule [id number]  Take 1 capsule (100 mg total) by mouth at bedtime. AND increase to 2 capsules (200 mg total) at bedtime prior to cycle.   Active             Home Care and Equipment/Supplies: Were Home Health Services Ordered?: NA Any new equipment or medical supplies ordered?: NA  Functional Questionnaire: Do you need assistance with bathing/showering or dressing?: No Do you need assistance with meal preparation?: No Do you need assistance with eating?: No Do you have difficulty maintaining continence: No Do you need assistance with getting out of bed/getting out of a chair/moving?: No Do you have difficulty managing or taking your medications?: No  Follow up appointments reviewed: PCP Follow-up appointment confirmed?: NA Specialist Hospital Follow-up appointment confirmed?: NA Do you need transportation to your follow-up appointment?: No Do you understand care options if your condition(s) worsen?: Yes-patient verbalized understanding    SIGNATURE Shanda Claudene

## 2024-05-03 ENCOUNTER — Other Ambulatory Visit: Payer: Self-pay | Admitting: Obstetrics and Gynecology

## 2024-05-03 DIAGNOSIS — Z1231 Encounter for screening mammogram for malignant neoplasm of breast: Secondary | ICD-10-CM

## 2024-05-04 ENCOUNTER — Other Ambulatory Visit (HOSPITAL_BASED_OUTPATIENT_CLINIC_OR_DEPARTMENT_OTHER): Payer: Self-pay

## 2024-05-15 ENCOUNTER — Other Ambulatory Visit: Payer: Self-pay | Admitting: Family Medicine

## 2024-05-17 ENCOUNTER — Telehealth: Payer: Self-pay | Admitting: Nurse Practitioner

## 2024-05-17 ENCOUNTER — Other Ambulatory Visit (HOSPITAL_BASED_OUTPATIENT_CLINIC_OR_DEPARTMENT_OTHER): Payer: Self-pay

## 2024-05-17 MED ORDER — GABAPENTIN 100 MG PO CAPS
200.0000 mg | ORAL_CAPSULE | Freq: Two times a day (BID) | ORAL | 0 refills | Status: AC
  Filled 2024-05-17 – 2024-05-20 (×2): qty 180, 45d supply, fill #0

## 2024-05-17 NOTE — Telephone Encounter (Signed)
 Inbound call from [facility] stating patient recently sent in SIBO breath test. Stated they are needing requisition form to be faxed to [phone]. Please advise, thank you,

## 2024-05-17 NOTE — Telephone Encounter (Signed)
 [facility] order form and a copy of patient's insurance card have been faxed to [facility] at [phone].

## 2024-05-20 ENCOUNTER — Other Ambulatory Visit (HOSPITAL_BASED_OUTPATIENT_CLINIC_OR_DEPARTMENT_OTHER): Payer: Self-pay

## 2024-05-24 ENCOUNTER — Telehealth: Payer: Self-pay | Admitting: Nurse Practitioner

## 2024-05-24 NOTE — Telephone Encounter (Signed)
Patient calling in regards to results. Please advise.

## 2024-05-24 NOTE — Telephone Encounter (Signed)
 Colleen ordered the SIBO the pt is calling for results

## 2024-05-24 NOTE — Telephone Encounter (Signed)
 Colleen- Have you seen SIBO results on this patient? If not, I can call and request a copy or results

## 2024-05-24 NOTE — Telephone Encounter (Signed)
 Michele [name], this is a patient of Dr. Melba.  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  Please send a prescription for Xifaxan  550 mg 1 tab p.o. 3 times daily x 2 weeks  # 42, no refills and Florastor probiotic 1 p.o. twice daily for 4 weeks # 60, no refills.  Patient to contact her office if her insurance does not cover the cost of Xifaxan .  Thank you.

## 2024-05-25 ENCOUNTER — Other Ambulatory Visit (HOSPITAL_COMMUNITY): Payer: Self-pay

## 2024-05-25 ENCOUNTER — Other Ambulatory Visit (HOSPITAL_BASED_OUTPATIENT_CLINIC_OR_DEPARTMENT_OTHER): Payer: Self-pay

## 2024-05-25 ENCOUNTER — Telehealth: Payer: Self-pay

## 2024-05-25 MED ORDER — RIFAXIMIN 550 MG PO TABS
550.0000 mg | ORAL_TABLET | Freq: Three times a day (TID) | ORAL | 0 refills | Status: DC
  Filled 2024-05-25: qty 42, 14d supply, fill #0

## 2024-05-25 NOTE — Telephone Encounter (Signed)
 Pharmacy Patient Advocate Encounter  Per test claim: PA required; PA submitted to above mentioned insurance via CoverMyMeds Key/confirmation #/EOC [id number] Status is pending

## 2024-05-25 NOTE — Telephone Encounter (Signed)
 Linda/Dottie, see other phone note 8/4. THX.

## 2024-05-25 NOTE — Telephone Encounter (Signed)
Will need PA for xifaxan

## 2024-05-25 NOTE — Telephone Encounter (Signed)
 Pt notified via mychart. Script sent to pharmacy, rx PA team aware.

## 2024-05-25 NOTE — Telephone Encounter (Signed)
 PA request has been Started. New Encounter has been or will be created for follow up. For additional info see Pharmacy Prior Auth telephone encounter from 05/25/2024.

## 2024-05-25 NOTE — Telephone Encounter (Signed)
 Noted

## 2024-05-25 NOTE — Telephone Encounter (Signed)
 Pharmacy Patient Advocate Encounter   Received notification from Pt Calls Messages that prior authorization for Xifaxan  550MG  tablets is required/requested.   Insurance verification completed.   The patient is insured through [facility] .   Per test claim: PA required; PA started via CoverMyMeds. KEY [id number] . Waiting for clinical questions to populate.

## 2024-05-25 NOTE — Addendum Note (Signed)
 Addended by: PERLEY HANDING R on: 05/25/2024 08:07 AM   Modules accepted: Orders

## 2024-05-26 ENCOUNTER — Other Ambulatory Visit (HOSPITAL_BASED_OUTPATIENT_CLINIC_OR_DEPARTMENT_OTHER): Payer: Self-pay

## 2024-05-26 NOTE — Telephone Encounter (Signed)
 Spoke with pt and she is aware of results. She knows we are waiting on PA and will let her know when we are notified regarding PA.

## 2024-05-27 ENCOUNTER — Other Ambulatory Visit (HOSPITAL_BASED_OUTPATIENT_CLINIC_OR_DEPARTMENT_OTHER): Payer: Self-pay

## 2024-05-27 ENCOUNTER — Other Ambulatory Visit: Payer: Self-pay

## 2024-05-27 MED ORDER — ESTRADIOL 0.05 MG/24HR TD PTTW
1.0000 | MEDICATED_PATCH | TRANSDERMAL | 3 refills | Status: DC
  Filled 2024-05-27 (×2): qty 24, 84d supply, fill #0
  Filled 2024-07-20: qty 8, 28d supply, fill #0
  Filled 2024-08-16 – 2024-08-18 (×2): qty 8, 28d supply, fill #1
  Filled 2024-09-10: qty 8, 28d supply, fill #2
  Filled 2024-10-08: qty 8, 28d supply, fill #3

## 2024-05-28 ENCOUNTER — Other Ambulatory Visit (HOSPITAL_BASED_OUTPATIENT_CLINIC_OR_DEPARTMENT_OTHER): Payer: Self-pay

## 2024-05-28 ENCOUNTER — Other Ambulatory Visit (HOSPITAL_COMMUNITY): Payer: Self-pay

## 2024-05-28 NOTE — Telephone Encounter (Signed)
 Pharmacy Patient Advocate Encounter  Received notification from [employer] that Prior Authorization for Xifaxan  550MG  tablets has been APPROVED from 05-27-2024 to 06-26-2024  This is approval for 1 (one) fill.    PA #/Case ID/Reference #: Michele [name]

## 2024-05-31 ENCOUNTER — Other Ambulatory Visit (HOSPITAL_BASED_OUTPATIENT_CLINIC_OR_DEPARTMENT_OTHER): Payer: Self-pay | Admitting: Physician Assistant

## 2024-05-31 ENCOUNTER — Ambulatory Visit (INDEPENDENT_AMBULATORY_CARE_PROVIDER_SITE_OTHER): Admitting: Physician Assistant

## 2024-05-31 ENCOUNTER — Ambulatory Visit (INDEPENDENT_AMBULATORY_CARE_PROVIDER_SITE_OTHER)

## 2024-05-31 ENCOUNTER — Encounter (HOSPITAL_BASED_OUTPATIENT_CLINIC_OR_DEPARTMENT_OTHER): Payer: Self-pay | Admitting: Physician Assistant

## 2024-05-31 DIAGNOSIS — M25561 Pain in right knee: Secondary | ICD-10-CM

## 2024-05-31 NOTE — Progress Notes (Signed)
 Office Visit Note   Patient: Michele [name]           Date of Birth: 05-13-1966           MRN: [id number] Visit Date: 05/31/2024              Requested by: [name], DO [address] [place],  [place] [address] PCP: [name], DO   Assessment & Plan: Visit Diagnoses:  1. Acute pain of right knee     Plan: Patient is a 58 year old woman who comes in today with a chief complaint of right knee pain.  She was hiking in [place]  and got her foot caught on some rebar.  She thinks maybe it hyperextended this was about 11 days ago she said initially she had difficulty moving and pressure on the knee.  She is much better and only has what she describes as aching.  She just has some deficit in full flexion.  Exam today was benign she did not have any red flags ligaments were stable she did not have an effusion she really had no joint line tenderness negative McMurray's good MCL LCL.  Compartments were soft and nontender.  I recommended that she continue to treat this conservatively the next couple weeks she can return then if she is no better could consider an MRI at that time and of course if things got worse  Follow-Up Instructions: Return in about 3 weeks (around 06/21/2024).   Orders:  No orders of the defined types were placed in this encounter.  No orders of the defined types were placed in this encounter.     Procedures: No procedures performed   Clinical Data: No additional findings.   Subjective: Chief Complaint  Patient presents with   Right Knee - Pain    HPI pleasant 58 year old woman comes in today with a chief complaint of 11 days of right knee pain after hyperextending her knee.  She does not exactly remember the mechanism of injury.  She did not have significant effusion she was up in [place] .  She does not really feel unstable just feels like she has loss of motion especially with flexion  Review of Systems  All other systems reviewed and are  negative.    Objective: Vital Signs: There were no vitals taken for this visit.  Physical Exam Constitutional:      Appearance: Normal appearance.  Pulmonary:     Effort: Pulmonary effort is normal.  Neurological:     General: No focal deficit present.     Mental Status: She is alert and oriented to person, place, and time.  Psychiatric:        Mood and Affect: Mood normal.        Behavior: Behavior normal.     Ortho Exam Right knee she is neurovascular intact compartments are soft and nontender she has good active extension and flexion negative Homans' sign no effusion.  Good varus valgus anterior and posterior draw.  Mildly tender over the anterior aspect of the knee Specialty Comments:  No specialty comments available.  Imaging: No results found.   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 06/30/2022   Facial pain, atypical 06/30/2022   Nerve compression syndrome 06/30/2022   Chiari malformation type I (HCC) 06/30/2022   Pain in left shoulder 06/25/2022   Cervical disc disorder with radiculopathy of cervical region 06/03/2022   Neuropathy 10/30/2020   Left-sided trigeminal neuralgia 10/30/2020   Anxiety 08/22/2020   Hyperlipidemia 08/22/2020   Unstable angina (HCC)    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   Chronic pain 11/20/2021   COVID-19 long  hauler 10/30/2020   DDD (degenerative disc disease), cervical    Depression    Dorsalgia    Glaucoma    Hypercholesterolemia    Ileus (HCC)    Intestinal gas excretion    Memory loss 01/13/2020   Migraine 08/17/2020   Nonallopathic lesion of cervical region 01/23/2021   l areas are chronic   Patient tolerated the procedure well with improvement in symptoms  Patient given exercises, stretches and lifestyle modifications  See medications in patient instructions if given  Patient will follow up in 4-8 weeks   Peptic ulcer    Scapular dyskinesis 01/23/2021   Scarlet fever    Sequelae of other specified infectious and parasitic diseases 11/20/2021   Skin cancer    Tick bite 02/22/2018   UTI (urinary tract infection)    Yeast infection     Family History  Problem Relation Age of Onset   Hypertension Mother    Diabetes Mother    Diverticulitis Mother    Heart failure Mother    Hyperlipidemia Father    Diabetes Father    Heart attack Father    Arrhythmia Father    Sleep apnea Father    Heart attack Sister    Obesity Sister    Arthritis Sister    Asthma Sister    Alcohol abuse Brother    Stroke Maternal Uncle    Pancreatic cancer Maternal Uncle    Leukemia Maternal Grandmother    Diabetes Maternal Grandfather    Diabetes Paternal Grandmother    Heart attack Paternal Grandfather    Heart disease Paternal Grandfather    Migraines Daughter    Liver disease Other        Paternal great aunt   Breast cancer Neg Hx    Colon cancer Neg Hx    Esophageal cancer Neg Hx    Inflammatory bowel disease Neg Hx    Rectal cancer Neg Hx    Stomach cancer Neg Hx     Past Surgical History:  Procedure Laterality Date   BASAL CELL CARCINOMA EXCISION     BREAST BIOPSY Left 1992   Duct removal (No scar seen)    BREAST EXCISIONAL BIOPSY Left 1992   COLONOSCOPY  07/2021   DACRORHINOCYSTOTOMY  10/2020   LEFT HEART CATH AND CORONARY ANGIOGRAPHY N/A 01/16/2018   Procedure: LEFT HEART CATH AND  CORONARY ANGIOGRAPHY;  Surgeon: Mady Bruckner, MD;  Location: [facility];  Service: Cardiovascular;  Laterality: N/A;   SHOULDER ARTHROSCOPY WITH ROTATOR CUFF REPAIR AND SUBACROMIAL DECOMPRESSION Left 11/12/2022   Procedure: SHOULDER ARTHROSCOPY WITH ROTATOR CUFF DEBBRIDEMENT AND SUBACROMIAL DECOMPRESSION;  Surgeon: Dozier Soulier, MD;  Location: [facility];  Service: Orthopedics;  Laterality: Left;  UPPER GASTROINTESTINAL ENDOSCOPY  07/2021   Social History   Occupational History   Occupation: Teacher, adult education: [employer]  Tobacco Use   Smoking status: Never    Passive exposure: Never   Smokeless tobacco: Never  Vaping Use   Vaping status: Never Used  Substance and Sexual Activity   Alcohol use: Yes    Alcohol/week: 6.0 standard drinks of alcohol    Types: 5 Glasses of wine, 1 Cans of beer per week    Comment: 1 per day   Drug use: No   Sexual activity: Yes    Partners: Male

## 2024-06-01 ENCOUNTER — Other Ambulatory Visit (HOSPITAL_BASED_OUTPATIENT_CLINIC_OR_DEPARTMENT_OTHER): Payer: Self-pay

## 2024-06-12 ENCOUNTER — Emergency Department (HOSPITAL_BASED_OUTPATIENT_CLINIC_OR_DEPARTMENT_OTHER)
Admission: EM | Admit: 2024-06-12 | Discharge: 2024-06-13 | Disposition: A | Discharge status: Home / Self Care | Attending: Emergency Medicine | Admitting: Emergency Medicine

## 2024-06-12 ENCOUNTER — Encounter (HOSPITAL_BASED_OUTPATIENT_CLINIC_OR_DEPARTMENT_OTHER): Payer: Self-pay

## 2024-06-12 ENCOUNTER — Emergency Department (HOSPITAL_BASED_OUTPATIENT_CLINIC_OR_DEPARTMENT_OTHER)

## 2024-06-12 ENCOUNTER — Other Ambulatory Visit: Payer: Self-pay

## 2024-06-12 DIAGNOSIS — M545 Low back pain, unspecified: Secondary | ICD-10-CM | POA: Insufficient documentation

## 2024-06-12 DIAGNOSIS — R112 Nausea with vomiting, unspecified: Secondary | ICD-10-CM | POA: Diagnosis not present

## 2024-06-12 DIAGNOSIS — Z7982 Long term (current) use of aspirin: Secondary | ICD-10-CM | POA: Insufficient documentation

## 2024-06-12 DIAGNOSIS — R519 Headache, unspecified: Secondary | ICD-10-CM | POA: Insufficient documentation

## 2024-06-12 LAB — COMPREHENSIVE METABOLIC PANEL WITH GFR
ALT: 18 U/L (ref 0–44)
AST: 25 U/L (ref 15–41)
Albumin: 4.4 g/dL (ref 3.5–5.0)
Alkaline Phosphatase: 68 U/L (ref 38–126)
Anion gap: 13 (ref 5–15)
BUN: 14 mg/dL (ref 6–20)
CO2: 23 mmol/L (ref 22–32)
Calcium: 9.4 mg/dL (ref 8.9–10.3)
Chloride: 98 mmol/L (ref 98–111)
Creatinine, Ser: 0.75 mg/dL (ref 0.44–1.00)
GFR, Estimated: >60 mL/min (ref >60)
Glucose, Bld: 101 mg/dL — ABNORMAL HIGH (ref 70–99)
Potassium: 4.3 mmol/L (ref 3.5–5.1)
Sodium: 133 mmol/L — ABNORMAL LOW (ref 135–145)
Total Bilirubin: 0.3 mg/dL (ref 0.0–1.2)
Total Protein: 7.2 g/dL (ref 6.5–8.1)

## 2024-06-12 LAB — CBC
HCT: 38.4 % (ref 36.0–46.0)
Hemoglobin: 13.1 g/dL (ref 12.0–15.0)
MCH: 31.1 pg (ref 26.0–34.0)
MCHC: 34.1 g/dL (ref 30.0–36.0)
MCV: 91.2 fL (ref 80.0–100.0)
Platelets: 267 K/uL (ref 150–400)
RBC: 4.21 MIL/uL (ref 3.87–5.11)
RDW: 13.5 % (ref 11.5–15.5)
WBC: 4.8 K/uL (ref 4.0–10.5)
nRBC: 0 % (ref 0.0–0.2)

## 2024-06-12 LAB — URINALYSIS, ROUTINE W REFLEX MICROSCOPIC
Bilirubin Urine: NEGATIVE
Glucose, UA: NEGATIVE mg/dL
Ketones, ur: 15 mg/dL — AB
Leukocytes,Ua: NEGATIVE
Nitrite: NEGATIVE
Protein, ur: NEGATIVE mg/dL
Specific Gravity, Urine: 1.017 (ref 1.005–1.030)
pH: 5 (ref 5.0–8.0)

## 2024-06-12 LAB — LIPASE, BLOOD: Lipase: 29 U/L (ref 11–51)

## 2024-06-12 MED ORDER — DIPHENHYDRAMINE HCL 50 MG/ML IJ SOLN
25.0000 mg | Freq: Once | INTRAMUSCULAR | Status: AC
  Administered 2024-06-12: 25 mg via INTRAVENOUS
  Filled 2024-06-12: qty 1

## 2024-06-12 MED ORDER — LIDOCAINE 5 % EX PTCH: 1.0000 | MEDICATED_PATCH | CUTANEOUS | Status: DC

## 2024-06-12 MED ORDER — MAGNESIUM SULFATE 2 GM/50ML IV SOLN
2.0000 g | Freq: Once | INTRAVENOUS | Status: AC
  Administered 2024-06-12: 2 g via INTRAVENOUS
  Filled 2024-06-12: qty 50

## 2024-06-12 MED ORDER — DEXAMETHASONE SODIUM PHOSPHATE 10 MG/ML IJ SOLN
10.0000 mg | Freq: Once | INTRAMUSCULAR | Status: AC
  Administered 2024-06-12: 10 mg via INTRAVENOUS
  Filled 2024-06-12: qty 1

## 2024-06-12 MED ORDER — KETOROLAC TROMETHAMINE 30 MG/ML IJ SOLN
30.0000 mg | Freq: Once | INTRAMUSCULAR | Status: AC
  Administered 2024-06-12: 30 mg via INTRAVENOUS
  Filled 2024-06-12: qty 1

## 2024-06-12 MED ORDER — DIAZEPAM 5 MG/ML IJ SOLN
2.5000 mg | Freq: Once | INTRAMUSCULAR | Status: AC
  Administered 2024-06-12: 2.5 mg via INTRAVENOUS
  Filled 2024-06-12: qty 2

## 2024-06-12 MED ORDER — METOCLOPRAMIDE HCL 5 MG/ML IJ SOLN
10.0000 mg | Freq: Once | INTRAMUSCULAR | Status: AC
  Administered 2024-06-12: 10 mg via INTRAVENOUS
  Filled 2024-06-12: qty 2

## 2024-06-12 MED ORDER — LACTATED RINGERS IV BOLUS
1000.0000 mL | Freq: Once | INTRAVENOUS | Status: AC
  Administered 2024-06-12: 1000 mL via INTRAVENOUS

## 2024-06-12 NOTE — ED Provider Notes (Signed)
 [place] EM[facility]GENCY DEPARTMENT AT [facility] Provider Note   CSN: [id number] Arrival date & time: 06/12/24  2015     Patient presents with: Emesis   Michele [name] is a 58 y.o. female.  {Add pertinent medical, surgical, social history, OB history to HPI:32947} HPI      Has had chronic pain, was stable over the past wee, had covid and was not at work  Had 2 drinks, an hour later went to get grocery, headache began after getting home and after an hour of it starting it peaked to its worse.  Headache now 8/10.   Denies numbness, weakness, difficulty talking or walking, visual changes or facial droop.  Vomited x3 with 4-5 with each episode, no hematemesis No diarrhea or constipation  No chst pain or dyspnea No fever or cough since Wednesday No pain with urination, no loss of control of bowel or bladder Back pain lower left back, feels like the back pain she has but now feels like more nerve pain    Prior to Admission medications   Medication Sig Start Date End Date Taking? Authorizing Provider  aspirin  EC 81 MG tablet Take 1 tablet by mouth every other day.    [provider]  Cholecalciferol (VITAMIN D3) 25 MCG/SPRAY LIQD Take 250 mcg by mouth daily.    [provider]  estradiol  (VIVELLE -DOT) 0.025 MG/24HR Apply 1 patch twice a week by transdermal route. 06/24/23     estradiol  (VIVELLE -DOT) 0.05 MG/24HR patch Place 1 patch (0.05 mg total) onto the skin 2 (two) times a week. 05/27/24     fluconazole  (DIFLUCAN ) 150 MG tablet Take one tablet by mouth at first sign of yeast infection. May repeat in 72 hours if needed 11/05/23   Crain, Whitney L, PA  FLUoxetine  (PROZAC ) 10 MG capsule Take 1 capsule (10mg  total) by mouth every other day alternating with 2 capsules (20mg  total) by mouth every other day. 10/28/22   [name], [name], DO  gabapentin  (NEURONTIN ) 100 MG capsule Take 2 capsules (200 mg total) by mouth 2 (two) times daily. 05/17/24   Claudene Arthea HERO, DO   hydrOXYzine  (ATARAX ) 10 MG tablet Take 1-2 tablets (10-20 mg total) by mouth 3 (three) times daily as needed for itching. 07/20/23     methylPREDNISolone  (MEDROL  DOSEPAK) 4 MG TBPK tablet Take as directed on pack for 6 days. Patient not taking: Reported on 04/29/2024 09/02/23   Leonce Katz, DO  progesterone  (PROMETRIUM ) 100 MG capsule Take 1 capsule (100 mg total) by mouth at bedtime. AND increase to 2 capsules (200 mg total) at bedtime prior to cycle. 12/15/23     rifaximin  (XIFAXAN ) 550 MG TABS tablet Take 1 tablet (550 mg total) by mouth 3 (three) times daily. 05/25/24   Kennedy-Smith, Colleen M, NP    Allergies: Lipitor [atorvastatin calcium] and Celebrex [celecoxib]    Review of Systems  Updated Vital Signs BP (!) 141/86 (BP Location: Right Arm)   Pulse 66   Temp (!) 97.5 F (36.4 C) (Oral)   Resp 16   LMP 05/16/2024   SpO2 100%   Physical Exam  (all labs ordered are listed, but only abnormal results are displayed) Labs Reviewed  COMPREHENSIVE METABOLIC PANEL WITH GFR - Abnormal; Notable for the following components:      Result Value   Sodium 133 (*)    Glucose, Bld 101 (*)    All other components within normal limits  URINALYSIS, ROUTINE W REFLEX MICROSCOPIC - Abnormal; Notable for the  following components:   Hgb urine dipstick SMALL (*)    Ketones, ur 15 (*)    Bacteria, UA RARE (*)    All other components within normal limits  LIPASE, BLOOD  CBC    EKG: None  Radiology: No results found.  {Document cardiac monitor, telemetry assessment procedure when appropriate:32947} Procedures   Medications Ordered in the ED - No data to display    {Click here for ABCD2, HEART and other calculators REFRESH Note before signing:1}                              Medical Decision Making Amount and/or Complexity of Data Reviewed Labs: ordered.   ***  {Document critical care time when appropriate  Document review of labs and clinical decision tools ie CHADS2VASC2,  etc  Document your independent review of radiology images and any outside records  Document your discussion with family members, caretakers and with consultants  Document social determinants of health affecting pt's care  Document your decision making why or why not admission, treatments were needed:32947:::1}   Final diagnoses:  None    ED Discharge Orders     None

## 2024-06-12 NOTE — ED Triage Notes (Signed)
 Pt tested positive for COVID on 08/16.   Today had two drinks and one hour later had a headache, feels dehydrated, drank 2 L of water, and vomited. Hasn't being able to keep anything down 1630.   Low back pain 8 out of 10. Headache 9 out of 10. Alert and oriented.

## 2024-06-13 NOTE — ED Notes (Signed)
 Water given for PO challenge.

## 2024-06-17 ENCOUNTER — Ambulatory Visit
Admission: RE | Admit: 2024-06-17 | Discharge: 2024-06-17 | Disposition: A | Discharge status: Home / Self Care | Source: Ambulatory Visit | Attending: Obstetrics and Gynecology | Admitting: Obstetrics and Gynecology

## 2024-06-17 ENCOUNTER — Ambulatory Visit: Admitting: Family Medicine

## 2024-06-17 DIAGNOSIS — Z1231 Encounter for screening mammogram for malignant neoplasm of breast: Secondary | ICD-10-CM | POA: Diagnosis not present

## 2024-06-24 ENCOUNTER — Ambulatory Visit (HOSPITAL_BASED_OUTPATIENT_CLINIC_OR_DEPARTMENT_OTHER): Admitting: Student

## 2024-06-25 ENCOUNTER — Other Ambulatory Visit (HOSPITAL_BASED_OUTPATIENT_CLINIC_OR_DEPARTMENT_OTHER): Payer: Self-pay

## 2024-06-25 MED ORDER — FLUCONAZOLE 150 MG PO TABS
150.0000 mg | ORAL_TABLET | ORAL | 1 refills | Status: DC
  Filled 2024-06-25: qty 2, 2d supply, fill #0

## 2024-07-02 ENCOUNTER — Telehealth: Payer: Self-pay

## 2024-07-02 DIAGNOSIS — H04423 Chronic lacrimal canaliculitis of bilateral lacrimal passages: Secondary | ICD-10-CM | POA: Insufficient documentation

## 2024-07-02 NOTE — Telephone Encounter (Signed)
 Pt has OV on 9/15. Pt has not been seen by pcp since 2024.

## 2024-07-02 NOTE — Telephone Encounter (Signed)
 Communication Did the patient discuss referral with their provider in the last year? Yes  (If No - schedule appointment)  (If Yes - send message)  Appointment offered? No   Type of order/referral and detailed reason for visit: Dermatologist   Preference of office, provider, location: Provider within [facility], [address] [phone]  If referral order, have you been seen by this specialty before? Yes  (If Yes, this issue or another issue? When? Where?  Can we respond through MyChart? Yes. Patient asked if she could be texted or sent a message to know that referral was sent over.

## 2024-07-05 ENCOUNTER — Encounter: Payer: Self-pay | Admitting: Family Medicine

## 2024-07-05 ENCOUNTER — Ambulatory Visit (INDEPENDENT_AMBULATORY_CARE_PROVIDER_SITE_OTHER): Admitting: Family Medicine

## 2024-07-05 DIAGNOSIS — L814 Other melanin hyperpigmentation: Secondary | ICD-10-CM

## 2024-07-05 DIAGNOSIS — Z23 Encounter for immunization: Secondary | ICD-10-CM

## 2024-07-05 DIAGNOSIS — F419 Anxiety disorder, unspecified: Secondary | ICD-10-CM

## 2024-07-05 DIAGNOSIS — L821 Other seborrheic keratosis: Secondary | ICD-10-CM

## 2024-07-05 DIAGNOSIS — D225 Melanocytic nevi of trunk: Secondary | ICD-10-CM

## 2024-07-05 DIAGNOSIS — Z91199 Patient's noncompliance with other medical treatment and regimen due to unspecified reason: Secondary | ICD-10-CM

## 2024-07-05 DIAGNOSIS — E782 Mixed hyperlipidemia: Secondary | ICD-10-CM

## 2024-07-05 NOTE — Patient Instructions (Signed)

## 2024-07-05 NOTE — Progress Notes (Signed)
 No show

## 2024-07-12 ENCOUNTER — Other Ambulatory Visit: Payer: Self-pay

## 2024-07-12 ENCOUNTER — Ambulatory Visit: Admitting: Family Medicine

## 2024-07-12 ENCOUNTER — Other Ambulatory Visit (HOSPITAL_BASED_OUTPATIENT_CLINIC_OR_DEPARTMENT_OTHER): Payer: Self-pay

## 2024-07-12 ENCOUNTER — Ambulatory Visit: Payer: Self-pay

## 2024-07-12 ENCOUNTER — Encounter: Payer: Self-pay | Admitting: Family Medicine

## 2024-07-12 VITALS — BP 118/76 | HR 74 | Temp 98.0°F | Wt 141.4 lb

## 2024-07-12 DIAGNOSIS — F419 Anxiety disorder, unspecified: Secondary | ICD-10-CM | POA: Diagnosis not present

## 2024-07-12 DIAGNOSIS — H109 Unspecified conjunctivitis: Secondary | ICD-10-CM

## 2024-07-12 MED ORDER — FLUOXETINE HCL 10 MG PO CAPS
10.0000 mg | ORAL_CAPSULE | Freq: Every day | ORAL | 1 refills | Status: AC
  Filled 2024-07-12: qty 135, 90d supply, fill #0
  Filled 2024-10-08: qty 135, 90d supply, fill #1

## 2024-07-12 MED ORDER — POLYMYXIN B-TRIMETHOPRIM 10000-0.1 UNIT/ML-% OP SOLN
2.0000 [drp] | Freq: Four times a day (QID) | OPHTHALMIC | 0 refills | Status: DC
  Filled 2024-07-12: qty 10, 25d supply, fill #0

## 2024-07-12 NOTE — Patient Instructions (Addendum)

## 2024-07-12 NOTE — Telephone Encounter (Signed)
 FYI Only or Action Required?: FYI only for provider.  Patient was last seen in primary care on 11/05/2023 by Lowella Benton CROME, PA.  Called Nurse Triage reporting Eye Problem.  Symptoms began several days ago.  Interventions attempted: Nothing.  Symptoms are: unchanged.  Triage Disposition: See Physician Within 24 Hours  Patient/caregiver understands and will follow disposition?: Yes     Copied from CRM [id number]. Topic: Clinical - Red Word Triage >> Jul 12, 2024  7:32 AM Carlyon D wrote: Red Word that prompted transfer to Nurse Triage: left eye infection, corner of eye there is yellow puss coming out and has been through out the day. Reason for Disposition  Yellow or green pus occurs  Answer Assessment - Initial Assessment Questions 1. ONSET: When did the pain start? (e.g., minutes, hours, days)     Friday  2. TIMING: Does the pain come and go, or has it been constant since it started? (e.g., constant, intermittent, fleeting)     Denies pain 3. SEVERITY: How bad is the pain?  (Scale 1-10; mild, moderate or severe)     na 4. LOCATION: Where does it hurt?  (e.g., eyelid, eye, cheekbone)     Left eye, draining yellow 5. CAUSE: What do you think is causing the pain?     infection 6. VISION: Do you have blurred vision or changes in your vision?      denies 7. EYE DISCHARGE: Is there any discharge (pus) from the eye(s)?  If Yes, ask: What color is it?      yellow 8. FEVER: Do you have a fever? If Yes, ask: What is it, how was it measured, and when did it start?      denies 9. OTHER SYMPTOMS: Do you have any other symptoms? (e.g., headache, nasal discharge, facial rash)     headache 10. PREGNANCY: Is there any chance you are pregnant? When was your last menstrual period?       na  Protocols used: Eye Pain and Other Symptoms-A-AH

## 2024-07-12 NOTE — Progress Notes (Signed)
 Michele [name] , [birth year], 58 y.o., female MRN: [id number] Patient Care Team    Relationship Specialty Notifications Start End  [name], DO PCP - General Family Medicine  12/07/21   Mat Browning, MD Consulting Physician Obstetrics and Gynecology  12/07/21   Mansouraty, Aloha Raddle., MD Consulting Physician Gastroenterology  12/07/21   Melba Geofm Helling, MD Referring Physician Ophthalmology  12/07/21   Ines Onetha NOVAK, MD Consulting Physician Neurology  12/07/21   Jerrie Anger, PA (Inactive) Physician Assistant Cardiology  12/07/21     Chief Complaint  Patient presents with   Eye Drainage    L eye; red, swollen, drainage, irritation. Pt has tried Benadryl .      Subjective: Michele [name] is a 58 y.o. Pt presents for an OV with complaints of left eye drainage of 4 days duration.  Associated symptoms include patient reports yellow pus drainage from left eye.  Patient reports she has been waking up in the morning with it matted shut.  She has been placing warm compresses daily. Patient reports today eye feels a little itchy.  Right eye is asymptomatic. Pt has tried Benadryl  to ease their symptoms.   Anxiety: Patient reports she has been out of her Prozac  and would like refills today possible.  She alternates 10 mg / 20 mg every other day.      05/19/2023   11:40 AM 01/27/2023    9:14 AM 10/28/2022    8:51 AM 12/07/2021   10:11 AM 01/12/2020    4:11 PM  Depression screen PHQ 2/9  Decreased Interest 0 0 0 0 3  Down, Depressed, Hopeless 0 0 0 0 2  PHQ - 2 Score 0 0 0 0 5    Allergies  Allergen Reactions   Lipitor [Atorvastatin Calcium] Other (See Comments)    Memory issue; myalgia   Celebrex [Celecoxib] Rash   Social History   Social History Narrative   Marital status/children/pets: 2 children.    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   DDD (degenerative disc disease), cervical    Depression    Dorsalgia    Glaucoma    Hypercholesterolemia    Ileus (HCC)    Intestinal gas excretion    Memory loss 01/13/2020   Migraine 08/17/2020   Nonallopathic lesion of cervical region 01/23/2021   l areas are chronic   Patient tolerated the procedure well with improvement in symptoms  Patient given exercises, stretches and lifestyle modifications  See medications in patient instructions if given  Patient will follow up in 4-8 weeks   Peptic ulcer    Scapular dyskinesis 01/23/2021   Scarlet fever    Sequelae of other specified infectious and parasitic diseases 11/20/2021   Skin cancer    Tick bite 02/22/2018   UTI (urinary tract infection)    Yeast infection    Past Surgical History:  Procedure Laterality Date   BASAL  CELL CARCINOMA EXCISION     BREAST BIOPSY Left 1992   Duct removal (No scar seen)    BREAST EXCISIONAL BIOPSY Left 1992   COLONOSCOPY  07/2021   DACRORHINOCYSTOTOMY  10/2020   LEFT HEART CATH AND CORONARY ANGIOGRAPHY N/A 01/16/2018   Procedure: LEFT HEART CATH AND CORONARY ANGIOGRAPHY;  Surgeon: Mady Bruckner, MD;  Location: [facility];  Service: Cardiovascular;  Laterality: N/A;   SHOULDER ARTHROSCOPY WITH ROTATOR CUFF REPAIR AND SUBACROMIAL DECOMPRESSION Left 11/12/2022   Procedure: SHOULDER ARTHROSCOPY WITH ROTATOR CUFF DEBBRIDEMENT AND SUBACROMIAL DECOMPRESSION;  Surgeon: Dozier Soulier, MD;  Location: [facility];  Service: Orthopedics;  Laterality: Left;   UPPER GASTROINTESTINAL ENDOSCOPY  07/2021   Family History  Problem Relation Age of Onset   Hypertension Mother     Diabetes Mother    Diverticulitis Mother    Heart failure Mother    Hyperlipidemia Father    Diabetes Father    Heart attack Father    Arrhythmia Father    Sleep apnea Father    Heart attack Sister    Obesity Sister    Arthritis Sister    Asthma Sister    Alcohol abuse Brother    Stroke Maternal Uncle    Pancreatic cancer Maternal Uncle    Leukemia Maternal Grandmother    Diabetes Maternal Grandfather    Diabetes Paternal Grandmother    Heart attack Paternal Grandfather    Heart disease Paternal Grandfather    Migraines Daughter    Liver disease Other        Paternal great aunt   Breast cancer Neg Hx    Colon cancer Neg Hx    Esophageal cancer Neg Hx    Inflammatory bowel disease Neg Hx    Rectal cancer Neg Hx    Stomach cancer Neg Hx    Allergies as of 07/12/2024       Reactions   Lipitor [atorvastatin Calcium] Other (See Comments)   Memory issue; myalgia   Celebrex [celecoxib] Rash        Medication List        Accurate as of July 12, 2024 11:26 AM. If you have any questions, ask your nurse or doctor.          aspirin  EC 81 MG tablet Take 1 tablet by mouth every other day.   butalbital -acetaminophen -caffeine  50-325-40 MG tablet Commonly known as: FIORICET TAKE 1 TABLET BY MOUTH EVERY 6 (SIX) HOURS AS NEEDED FOR HEADACHE.   cyanocobalamin  250 MCG tablet Commonly known as: VITAMIN B12 Take 250 mcg by mouth.   estradiol  0.025 MG/24HR Commonly known as: VIVELLE -DOT Apply 1 patch twice a week by transdermal route.   estradiol  0.05 MG/24HR patch Commonly known as: VIVELLE -DOT Place 1 patch (0.05 mg total) onto the skin 2 (two) times a week.   fluconazole  150 MG tablet Commonly known as: Diflucan  Take 1 tablet (150 mg total) by mouth as directed.   FLUoxetine  10 MG capsule Commonly known as: PROZAC  Take 1 capsule by mouth every other day alternating with 2 capsules (20mg  total) by mouth every other day. What changed:  how much to take how  to take this when to take this Changed by: Charlies Bellini   gabapentin  100 MG capsule Commonly known as: NEURONTIN  Take 2 capsules (200 mg total) by mouth 2 (two) times daily.   hydrOXYzine  10 MG tablet Commonly known as: ATARAX  Take 1-2 tablets (10-20 mg total) by mouth 3 (three) times daily as needed for itching.  progesterone  100 MG capsule Commonly known as: PROMETRIUM  Take 1 capsule (100 mg total) by mouth at bedtime. AND increase to 2 capsules (200 mg total) at bedtime prior to cycle.   trimethoprim -polymyxin b  ophthalmic solution Commonly known as: POLYTRIM  Place 2 drops into the left eye every 6 (six) hours for 5 days. Started by: Charlies Bellini   Vitamin D3 25 MCG/SPRAY Liqd Take 250 mcg by mouth daily.        All past medical history, surgical history, allergies, family history, immunizations andmedications were updated in the EMR today and reviewed under the history and medication portions of their EMR.     ROS Negative, with the exception of above mentioned in HPI   Objective:  BP 118/76   Pulse 74   Temp 98 F (36.7 C)   Wt 141 lb 6.4 oz (64.1 kg)   LMP 05/16/2024   SpO2 97%   BMI 25.05 kg/m  Body mass index is 25.05 kg/m. Physical Exam Vitals and nursing note reviewed.  Constitutional:      General: She is not in acute distress.    Appearance: Normal appearance. She is normal weight. She is not ill-appearing or toxic-appearing.  HENT:     Head: Normocephalic and atraumatic.     Right Ear: Ear canal and external ear normal. There is no impacted cerumen.     Left Ear: Ear canal and external ear normal. There is no impacted cerumen.     Nose: Nose normal. No congestion or rhinorrhea.     Mouth/Throat:     Mouth: Mucous membranes are moist.     Pharynx: No oropharyngeal exudate or posterior oropharyngeal erythema.     Comments: Postnasal drip present Eyes:     General: Lids are normal. Vision grossly intact. No scleral icterus.       Right eye: No  discharge.        Left eye: Discharge present.No foreign body or hordeolum.     Extraocular Movements: Extraocular movements intact.     Right eye: Normal extraocular motion and no nystagmus.     Left eye: Normal extraocular motion and no nystagmus.     Conjunctiva/sclera:     Right eye: Right conjunctiva is not injected. No chemosis, exudate or hemorrhage.    Left eye: Left conjunctiva is injected. No chemosis, exudate or hemorrhage.    Pupils: Pupils are equal, round, and reactive to light.  Musculoskeletal:     Cervical back: Neck supple.  Lymphadenopathy:     Cervical: No cervical adenopathy.  Skin:    Findings: No rash.  Neurological:     Mental Status: She is alert and oriented to person, place, and time. Mental status is at baseline.     Motor: No weakness.     Coordination: Coordination normal.     Gait: Gait normal.  Psychiatric:        Mood and Affect: Mood normal.        Behavior: Behavior normal.        Thought Content: Thought content normal.        Judgment: Judgment normal.      No results found. No results found. No results found for this or any previous visit (from the past 24 hours).  Assessment/Plan: INESSA WARDROP is a 58 y.o. female present for OV for  Anxiety (Primary) Stable Continue Prozac  10/20 alternating days Follow-up every 6 months, sooner if needed  Bacterial conjunctivitis of left eye Continue warm compresses twice daily Polytrim  ophthalmic  solution 4 times daily x 5 days Follow-up as needed  Reviewed expectations re: course of current medical issues. Discussed self-management of symptoms. Outlined signs and symptoms indicating need for more acute intervention. Patient verbalized understanding and all questions were answered. Patient received an After-Visit Summary.    No orders of the defined types were placed in this encounter.  Meds ordered this encounter  Medications   trimethoprim -polymyxin b  (POLYTRIM ) ophthalmic solution     Sig: Place 2 drops into the left eye every 6 (six) hours for 5 days.    Dispense:  10 mL    Refill:  0   FLUoxetine  (PROZAC ) 10 MG capsule    Sig: Take 1 capsule by mouth every other day alternating with 2 capsules (20mg  total) by mouth every other day.    Dispense:  135 capsule    Refill:  1   Referral Orders  No referral(s) requested today     Note is dictated utilizing voice recognition software. Although note has been proof read prior to signing, occasional typographical errors still can be missed. If any questions arise, please do not hesitate to call for verification.   electronically signed by:  Charlies Bellini, DO  [facility] - [place]

## 2024-07-15 ENCOUNTER — Other Ambulatory Visit (HOSPITAL_BASED_OUTPATIENT_CLINIC_OR_DEPARTMENT_OTHER): Payer: Self-pay

## 2024-07-15 ENCOUNTER — Ambulatory Visit (INDEPENDENT_AMBULATORY_CARE_PROVIDER_SITE_OTHER): Admitting: Medical

## 2024-07-15 ENCOUNTER — Ambulatory Visit: Payer: Self-pay

## 2024-07-15 VITALS — BP 118/80 | HR 66 | Temp 98.1°F | Resp 14 | Ht 63.0 in | Wt 143.0 lb

## 2024-07-15 DIAGNOSIS — H109 Unspecified conjunctivitis: Secondary | ICD-10-CM

## 2024-07-15 MED ORDER — MOXIFLOXACIN HCL 0.5 % OP SOLN
1.0000 [drp] | Freq: Three times a day (TID) | OPHTHALMIC | 0 refills | Status: AC
  Filled 2024-07-15: qty 3, 20d supply, fill #0

## 2024-07-15 NOTE — Progress Notes (Signed)
 Subjective:    Patient ID: Michele [name], female    DOB: 07/07/1966, 58 y.o.   MRN: [id number]  HPI  Michele [name] is a 58 year old female who presents with left eye discharge and redness.  Last Friday, she noticed a pale yellow discharge from her left eye while at work. She initially thought it might be related to allergies, although she has never had allergies before. The discharge was only in one eye.   By Sunday, the sclera became more pink and red, and the discharge increased, becoming more like a matting that required a wet washcloth to open her eye in the morning. Initially yellow, the discharge turned light green by Tuesday and has remained so. She has been using hot compresses but switched to cold compresses. Despite these measures, the discharge has not decreased, and her eye continues to burn and itch.  She visited a doctor on Monday and was prescribed Polytrim  (Polynexin) eye drops, which she has been using, but she reports that her symptoms have persisted. 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  She mentions that her deductible is met, which may influence her ability to afford certain medications.  No vison changes, loss of vision, spots in visual fields, floater or light sensitivity reported.       Review of Systems  Constitutional:  Negative for chills, fatigue and fever.  Eyes:  Positive for discharge and redness.  Respiratory:  Negative for chest tightness and shortness of breath.   Cardiovascular:  Negative for chest pain and palpitations.  Gastrointestinal:  Negative for abdominal pain.  Musculoskeletal:  Negative for back pain and joint swelling.  Skin:  Negative for rash.  Hematological:  Negative for adenopathy.  Psychiatric/Behavioral:  Negative for behavioral problems and confusion.    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   Chronic pain 11/20/2021   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   DDD (degenerative disc disease), cervical    Depression    Dorsalgia    Glaucoma    Hypercholesterolemia    Ileus (HCC)    Intestinal gas excretion    Memory loss 01/13/2020   Migraine 08/17/2020   Nonallopathic lesion of cervical region 01/23/2021   l areas are chronic   Patient tolerated the procedure well with improvement in symptoms  Patient given exercises, stretches and lifestyle modifications  See medications in patient instructions if given  Patient will follow up in 4-8 weeks   Peptic ulcer    Scapular dyskinesis 01/23/2021   Scarlet fever    Sequelae of other specified infectious and parasitic diseases 11/20/2021   Skin cancer    Tick bite 02/22/2018   UTI (urinary tract infection)    Yeast infection      Social History   Socioeconomic History   Marital status: Married    Spouse name: Not on file   Number of children: 2   Years of education: Not on file   Highest education level: Bachelor's degree (e.g., BA, AB, BS)  Occupational History   Occupation: Teacher, adult education: [employer]  Tobacco Use   Smoking status: Never    Passive exposure: Never   Smokeless tobacco: Never  Vaping Use  Vaping status: Never Used  Substance and Sexual Activity   Alcohol use: Yes    Alcohol/week: 6.0 standard drinks of alcohol    Types: 5 Glasses of wine, 1 Cans of beer per week    Comment: 1 per day   Drug use: No   Sexual activity: Yes    Partners: Male  Other Topics Concern   Not on file  Social History Narrative   Marital status/children/pets: 2 children.    Education/employment: [employer]      -Wears a bicycle helmet riding a bike: Yes     -smoke alarm in the home:Yes     - wears seatbelt: Yes     - Feels safe in their relationships: Yes      Social Drivers of Research scientist (physical sciences) Strain: Low Risk  (01/27/2023)   Overall Financial Resource Strain (CARDIA)    Difficulty of Paying Living Expenses: Not hard at all  Food Insecurity: No Food Insecurity (01/27/2023)   Hunger Vital Sign    Worried About Running Out of Food in the Last Year: Never true    Ran Out of Food in the Last Year: Never true  Transportation Needs: No Transportation Needs (01/27/2023)   PRAPARE - Administrator, Civil Service (Medical): No    Lack of Transportation (Non-Medical): No  Physical Activity: Insufficiently Active (01/27/2023)   Exercise Vital Sign    Days of Exercise per Week: 2 days    Minutes of Exercise per Session: 60 min  Stress: Stress Concern Present (01/27/2023)   [employer] of Occupational Health - Occupational Stress Questionnaire    Feeling of Stress : To some extent  Social Connections: Socially Integrated (01/27/2023)   Social Connection and Isolation Panel    Frequency of Communication with Friends and Family: Three times a week    Frequency of Social Gatherings with Friends and Family: Once a week    Attends Religious Services: More than 4 times per year    Active Member of Clubs or Organizations: Yes    Attends Engineer, structural: More than 4 times per year    Marital Status: Married  Catering manager Violence: Not on file    Past Surgical History:  Procedure Laterality Date   BASAL CELL CARCINOMA EXCISION     BREAST BIOPSY Left 1992   Duct removal (No scar seen)    BREAST EXCISIONAL BIOPSY Left 1992   COLONOSCOPY  07/2021   DACRORHINOCYSTOTOMY  10/2020   LEFT HEART CATH AND CORONARY ANGIOGRAPHY N/A 01/16/2018   Procedure: LEFT HEART CATH AND CORONARY ANGIOGRAPHY;  Surgeon: Mady Bruckner, MD;  Location: [facility];  Service: Cardiovascular;  Laterality: N/A;   SHOULDER ARTHROSCOPY WITH ROTATOR CUFF REPAIR AND SUBACROMIAL DECOMPRESSION Left 11/12/2022   Procedure: SHOULDER ARTHROSCOPY WITH ROTATOR CUFF DEBBRIDEMENT AND  SUBACROMIAL DECOMPRESSION;  Surgeon: Dozier Soulier, MD;  Location: [facility];  Service: Orthopedics;  Laterality: Left;   UPPER GASTROINTESTINAL ENDOSCOPY  07/2021    Family History  Problem Relation Age of Onset   Hypertension Mother    Diabetes Mother    Diverticulitis Mother    Heart failure Mother    Hyperlipidemia Father    Diabetes Father    Heart attack Father    Arrhythmia Father    Sleep apnea Father    Heart attack Sister    Obesity Sister    Arthritis Sister    Asthma Sister    Alcohol abuse  Brother    Stroke Maternal Uncle    Pancreatic cancer Maternal Uncle    Leukemia Maternal Grandmother    Diabetes Maternal Grandfather    Diabetes Paternal Grandmother    Heart attack Paternal Grandfather    Heart disease Paternal Grandfather    Migraines Daughter    Liver disease Other        Paternal great aunt   Breast cancer Neg Hx    Colon cancer Neg Hx    Esophageal cancer Neg Hx    Inflammatory bowel disease Neg Hx    Rectal cancer Neg Hx    Stomach cancer Neg Hx     Allergies  Allergen Reactions   Lipitor [Atorvastatin Calcium] Other (See Comments)    Memory issue; myalgia   Celebrex [Celecoxib] Rash    Current Outpatient Medications on File Prior to Visit  Medication Sig Dispense Refill   aspirin  EC 81 MG tablet Take 1 tablet by mouth every other day.     Cholecalciferol (VITAMIN D3) 25 MCG/SPRAY LIQD Take 250 mcg by mouth daily.     estradiol  (VIVELLE -DOT) 0.05 MG/24HR patch Place 1 patch (0.05 mg total) onto the skin 2 (two) times a week. 8 patch 3   FLUoxetine  (PROZAC ) 10 MG capsule Take 1 capsule by mouth every other day alternating with 2 capsules (20mg  total) by mouth every other day. 135 capsule 1   gabapentin  (NEURONTIN ) 100 MG capsule Take 2 capsules (200 mg total) by mouth 2 (two) times daily. 180 capsule 0   progesterone  (PROMETRIUM ) 100 MG capsule Take 1 capsule (100 mg total) by mouth at bedtime. AND increase to 2 capsules  (200 mg total) at bedtime prior to cycle. 180 capsule 3   No current facility-administered medications on file prior to visit.    BP 118/80   Pulse 66   Temp 98.1 F (36.7 C) (Oral)   Resp 14   Ht 5' 3 (1.6 m)   Wt 143 lb (64.9 kg)   LMP 07/14/2024 (Exact Date)   SpO2 98%   BMI 25.33 kg/m          Objective:   Physical Exam  General- No acute distress. Pleasant patient. Neck- Full range of motion, no jvd Lungs- Clear, even and unlabored. Heart- regular rate and rhythm. Neurologic- CNII- XII grossly intact.  Left eye- mild injected conjunctiva. No dc presnetly. Lids not swollen. No uppper or lower lid inflammation.  Heent- no frontal or maxillary tenderness.       Assessment & Plan:   Patient Instructions  Bacterial conjunctivitis, left eye Bacterial conjunctivitis in the left eye with persistent symptoms despite Polytrim  treatment. Greenish discharge and burning sensation suggest bacterial etiology. Rapid resolution needed for work return. - Discontinue Polytrim  eye drops. - Prescribe Vigamox  (moxifloxacin ) eye drops, three times daily. - Instruct her to check Vigamox  cost and notify if not affordable. - Consider tobramycin if Vigamox  is unaffordable. - Advise MyChart update on Monday, Tuesday, or Wednesday for progress report.   Shervin Cypert, PA-C

## 2024-07-15 NOTE — Patient Instructions (Signed)
 Bacterial conjunctivitis, left eye Bacterial conjunctivitis in the left eye with persistent symptoms despite Polytrim  treatment. Greenish discharge and burning sensation suggest bacterial etiology. Rapid resolution needed for work return. - Discontinue Polytrim  eye drops. - Prescribe Vigamox  (moxifloxacin ) eye drops, three times daily. - Instruct her to check Vigamox  cost and notify if not affordable. - Consider tobramycin if Vigamox  is unaffordable. - Advise MyChart update on Monday, Tuesday, or Wednesday for progress report.

## 2024-07-15 NOTE — Telephone Encounter (Signed)
 FYI Only or Action Required?: Action required by provider: request for appointment.  Patient was last seen in primary care on 07/12/2024 by Catherine Fuller A, DO.  Called Nurse Triage reporting Eye Problem.  Symptoms began several days ago.  Interventions attempted: polytrim  drops.  Symptoms are: unchanged.  Triage Disposition: See Physician Within 24 Hours  Patient/caregiver understands and will follow disposition?: YesCopied from CRM #[id number]. Topic: Clinical - Red Word Triage >> Jul 15, 2024  7:59 AM Pinkey ORN wrote: Red Word that prompted transfer to Nurse Triage: Eye Infection >> Jul 15, 2024  8:00 AM Pinkey ORN wrote: Patient states the eye infection isn't getting any better. Patient states her eye has went from a pale yellow, to now dripping green discharge. Patient also states it's starting to spread over into her other (right) eye. Patient wants to know if she should she her eye doctor or if she needs an culture, to make sure the antibiotics are working. Patient will be out of antibiotics tomorrow.   Reason for Disposition  Eyelid is red and painful (or tender to touch)  Answer Assessment - Initial Assessment Questions Left eye: Discharge changed from yellow to green. I have green goo in corners, eye is red, and bottom lashes are caked up. Tomorrow is my last day on abx eye drop and it's not any better. No office appts today. Pt is being seen at [facility] at 1040.     1. EYE DISCHARGE: Is the discharge in one or both eyes? What color is it? How much is there? When did the discharge start?      Left is green  2. REDNESS OF SCLERA: Is there redness in the white of the eye? If Yes, ask: Is it in one or both eyes? When did the redness start?     yes 3. EYELIDS: Are the eyelids red or swollen? If Yes, ask: How much?      Bottom lid is saggy on left saggy 4. VISION: Do you have blurred vision?     denies 5. PAIN: Is there any pain? If Yes, ask: How  bad is the pain? (Scale 0-10; or none, mild, moderate, severe)     Mild-burning  6. CONTACT LENS: Do you wear contacts?     denies 7. OTHER SYMPTOMS: Do you have any other symptoms? (e.g., fever, runny nose, cough)     denies  Protocols used: Eye - Pus or Discharge-A-AH

## 2024-07-15 NOTE — Telephone Encounter (Signed)
 No further action needed at this time. Pt scheduled with provider at [facility].

## 2024-07-20 ENCOUNTER — Other Ambulatory Visit (HOSPITAL_BASED_OUTPATIENT_CLINIC_OR_DEPARTMENT_OTHER): Payer: Self-pay

## 2024-07-22 DIAGNOSIS — L814 Other melanin hyperpigmentation: Secondary | ICD-10-CM | POA: Diagnosis not present

## 2024-07-22 DIAGNOSIS — D225 Melanocytic nevi of trunk: Secondary | ICD-10-CM | POA: Diagnosis not present

## 2024-07-22 DIAGNOSIS — L578 Other skin changes due to chronic exposure to nonionizing radiation: Secondary | ICD-10-CM | POA: Diagnosis not present

## 2024-07-22 DIAGNOSIS — Z85828 Personal history of other malignant neoplasm of skin: Secondary | ICD-10-CM | POA: Diagnosis not present

## 2024-07-22 DIAGNOSIS — L821 Other seborrheic keratosis: Secondary | ICD-10-CM | POA: Diagnosis not present

## 2024-07-22 DIAGNOSIS — L570 Actinic keratosis: Secondary | ICD-10-CM | POA: Diagnosis not present

## 2024-07-22 DIAGNOSIS — L820 Inflamed seborrheic keratosis: Secondary | ICD-10-CM | POA: Diagnosis not present

## 2024-08-12 DIAGNOSIS — Z6826 Body mass index (BMI) 26.0-26.9, adult: Secondary | ICD-10-CM | POA: Diagnosis not present

## 2024-08-12 DIAGNOSIS — M5416 Radiculopathy, lumbar region: Secondary | ICD-10-CM | POA: Diagnosis not present

## 2024-08-16 ENCOUNTER — Other Ambulatory Visit (HOSPITAL_BASED_OUTPATIENT_CLINIC_OR_DEPARTMENT_OTHER): Payer: Self-pay

## 2024-08-18 ENCOUNTER — Other Ambulatory Visit (HOSPITAL_BASED_OUTPATIENT_CLINIC_OR_DEPARTMENT_OTHER): Payer: Self-pay

## 2024-08-18 ENCOUNTER — Other Ambulatory Visit (HOSPITAL_BASED_OUTPATIENT_CLINIC_OR_DEPARTMENT_OTHER): Payer: Self-pay | Admitting: Neurological Surgery

## 2024-08-18 ENCOUNTER — Other Ambulatory Visit: Payer: Self-pay

## 2024-08-18 ENCOUNTER — Other Ambulatory Visit (HOSPITAL_COMMUNITY): Payer: Self-pay

## 2024-08-18 DIAGNOSIS — M5416 Radiculopathy, lumbar region: Secondary | ICD-10-CM

## 2024-08-19 ENCOUNTER — Other Ambulatory Visit (HOSPITAL_BASED_OUTPATIENT_CLINIC_OR_DEPARTMENT_OTHER): Payer: Self-pay

## 2024-08-21 ENCOUNTER — Ambulatory Visit (HOSPITAL_BASED_OUTPATIENT_CLINIC_OR_DEPARTMENT_OTHER)
Admission: RE | Admit: 2024-08-21 | Discharge: 2024-08-21 | Disposition: A | Source: Ambulatory Visit | Attending: Neurological Surgery | Admitting: Neurological Surgery

## 2024-08-21 DIAGNOSIS — M5416 Radiculopathy, lumbar region: Secondary | ICD-10-CM | POA: Diagnosis present

## 2024-08-21 DIAGNOSIS — M51369 Other intervertebral disc degeneration, lumbar region without mention of lumbar back pain or lower extremity pain: Secondary | ICD-10-CM | POA: Diagnosis not present

## 2024-08-21 DIAGNOSIS — M48061 Spinal stenosis, lumbar region without neurogenic claudication: Secondary | ICD-10-CM | POA: Diagnosis not present

## 2024-08-23 ENCOUNTER — Encounter: Payer: Self-pay | Admitting: Radiology

## 2024-08-24 ENCOUNTER — Other Ambulatory Visit (HOSPITAL_BASED_OUTPATIENT_CLINIC_OR_DEPARTMENT_OTHER)

## 2024-08-26 DIAGNOSIS — M47816 Spondylosis without myelopathy or radiculopathy, lumbar region: Secondary | ICD-10-CM | POA: Diagnosis not present

## 2024-08-30 ENCOUNTER — Other Ambulatory Visit (HOSPITAL_BASED_OUTPATIENT_CLINIC_OR_DEPARTMENT_OTHER): Payer: Self-pay

## 2024-08-30 DIAGNOSIS — Z13820 Encounter for screening for osteoporosis: Secondary | ICD-10-CM | POA: Diagnosis not present

## 2024-09-06 NOTE — Progress Notes (Deleted)
 [facility] [address] [place] [address] Phone: [phone] Subjective:    I'm seeing this patient by the request  of:  [name], [name], DO  CC:   YEP:Dlagzrupcz  Michele [name] is a 58 y.o. female coming in with complaint of hand, thumb and neck pain. Last seen in January 2025 for shoulder pain. 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   DDD (degenerative disc disease), cervical    Depression    Dorsalgia    Glaucoma    Hypercholesterolemia    Ileus (HCC)    Intestinal gas excretion    Memory loss 01/13/2020   Migraine 08/17/2020   Nonallopathic lesion of cervical region 01/23/2021   l areas are chronic   Patient tolerated the procedure well with improvement in symptoms  Patient given exercises, stretches and lifestyle modifications  See medications in patient instructions if given  Patient will follow up in 4-8 weeks   Peptic ulcer    Scapular dyskinesis 01/23/2021   Scarlet fever    Sequelae of other specified infectious and parasitic diseases 11/20/2021   Skin cancer    Tick bite 02/22/2018   UTI (urinary tract infection)    Yeast infection    Past Surgical History:  Procedure Laterality Date   BASAL CELL CARCINOMA EXCISION     BREAST BIOPSY Left 1992   Duct removal (No scar seen)    BREAST EXCISIONAL BIOPSY Left 1992   COLONOSCOPY  07/2021   DACRORHINOCYSTOTOMY  10/2020   LEFT HEART CATH AND CORONARY ANGIOGRAPHY N/A 01/16/2018   Procedure: LEFT HEART CATH AND CORONARY ANGIOGRAPHY;  Surgeon: Mady Bruckner, MD;  Location: [facility];  Service: Cardiovascular;  Laterality: N/A;   SHOULDER ARTHROSCOPY WITH ROTATOR CUFF REPAIR AND SUBACROMIAL DECOMPRESSION Left  11/12/2022   Procedure: SHOULDER ARTHROSCOPY WITH ROTATOR CUFF DEBBRIDEMENT AND SUBACROMIAL DECOMPRESSION;  Surgeon: Dozier Soulier, MD;  Location: [facility];  Service: Orthopedics;  Laterality: Left;   UPPER GASTROINTESTINAL ENDOSCOPY  07/2021   Social History   Socioeconomic History   Marital status: Married    Spouse name: Not on file   Number of children: 2   Years of education: Not on file   Highest education level: Bachelor's degree (e.g., BA, AB, BS)  Occupational History   Occupation: Teacher, 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    Education/employment: [employer]      -Wears a bicycle helmet riding a bike: Yes     -smoke alarm in the home:Yes     - wears seatbelt: Yes     -  Feels safe in their relationships: Yes      Social Drivers of Corporate Investment Banker Strain: Low Risk  (01/27/2023)   Overall Financial Resource Strain (CARDIA)    Difficulty of Paying Living Expenses: Not hard at all  Food Insecurity: No Food Insecurity (01/27/2023)   Hunger Vital Sign    Worried About Running Out of Food in the Last Year: Never true    Ran Out of Food in the Last Year: Never true  Transportation Needs: No Transportation Needs (01/27/2023)   PRAPARE - Administrator, Civil Service (Medical): No    Lack of Transportation (Non-Medical): No  Physical Activity: Insufficiently Active (01/27/2023)   Exercise Vital Sign    Days of Exercise per Week: 2 days    Minutes of Exercise per Session: 60 min   Stress: Stress Concern Present (01/27/2023)   [employer] of Occupational Health - Occupational Stress Questionnaire    Feeling of Stress : To some extent  Social Connections: Socially Integrated (01/27/2023)   Social Connection and Isolation Panel    Frequency of Communication with Friends and Family: Three times a week    Frequency of Social Gatherings with Friends and Family: Once a week    Attends Religious Services: More than 4 times per year    Active Member of [employer] or Organizations: Yes    Attends Engineer, Structural: More than 4 times per year    Marital Status: Married   Allergies  Allergen Reactions   Lipitor [Atorvastatin Calcium] Other (See Comments)    Memory issue; myalgia   Celebrex [Celecoxib] Rash   Family History  Problem Relation Age of Onset   Hypertension Mother    Diabetes Mother    Diverticulitis Mother    Heart failure Mother    Hyperlipidemia Father    Diabetes Father    Heart attack Father    Arrhythmia Father    Sleep apnea Father    Heart attack Sister    Obesity Sister    Arthritis Sister    Asthma Sister    Alcohol abuse Brother    Stroke Maternal Uncle    Pancreatic cancer Maternal Uncle    Leukemia Maternal Grandmother    Diabetes Maternal Grandfather    Diabetes Paternal Grandmother    Heart attack Paternal Grandfather    Heart disease Paternal Grandfather    Migraines Daughter    Liver disease Other        Paternal great aunt   Breast cancer Neg Hx    Colon cancer Neg Hx    Esophageal cancer Neg Hx    Inflammatory bowel disease Neg Hx    Rectal cancer Neg Hx    Stomach cancer Neg Hx     Current Outpatient Medications (Endocrine & Metabolic):    estradiol  (VIVELLE -DOT) 0.05 MG/24HR patch, Place 1 patch (0.05 mg total) onto the skin 2 (two) times a week.   progesterone  (PROMETRIUM ) 100 MG capsule, Take 1 capsule (100 mg total) by mouth at bedtime. AND increase to 2 capsules (200 mg total) at bedtime prior to  cycle.    Current Outpatient Medications (Analgesics):    aspirin  EC 81 MG tablet, Take 1 tablet by mouth every other day.   Current Outpatient Medications (Other):    Cholecalciferol (VITAMIN D3) 25 MCG/SPRAY LIQD, Take 250 mcg by mouth daily.   FLUoxetine  (PROZAC ) 10 MG capsule, Take 1 capsule by mouth every other day alternating with 2 capsules (20mg  total) by mouth  every other day.   gabapentin  (NEURONTIN ) 100 MG capsule, Take 2 capsules (200 mg total) by mouth 2 (two) times daily.   moxifloxacin  (VIGAMOX ) 0.5 % ophthalmic solution, Place 1 drop into the left eye 3 (three) times daily.   Reviewed prior external information including notes and imaging from  primary care provider As well as notes that were available from care everywhere and other healthcare systems.  Past medical history, social, surgical and family history all reviewed in electronic medical record.  No pertanent information unless stated regarding to the chief complaint.   Review of Systems:  No headache, visual changes, nausea, vomiting, diarrhea, constipation, dizziness, abdominal pain, skin rash, fevers, chills, night sweats, weight loss, swollen lymph nodes, body aches, joint swelling, chest pain, shortness of breath, mood changes. POSITIVE muscle aches  Objective  There were no vitals taken for this visit.   General: No apparent distress alert and oriented x3 mood and affect normal, dressed appropriately.  HEENT: Pupils equal, extraocular movements intact  Respiratory: Patient's speak in full sentences and does not appear short of breath  Cardiovascular: No lower extremity edema, non tender, no erythema      Impression and Recommendations:

## 2024-09-08 ENCOUNTER — Ambulatory Visit: Payer: Self-pay

## 2024-09-08 ENCOUNTER — Ambulatory Visit: Admitting: Sports Medicine

## 2024-09-08 NOTE — Telephone Encounter (Signed)
 Patient called back in to cancel appointment today. She states her lower eyelid is not as red and sclera is not as red in left eye. She would like to give her medication more time and monitor symptoms at home. RN advised to call back for new or worsening symptoms.

## 2024-09-08 NOTE — Telephone Encounter (Signed)
Fyi reviewed

## 2024-09-08 NOTE — Telephone Encounter (Signed)
 Copied from CRM [id number]. Topic: Clinical - Red Word Triage >> Sep 08, 2024 10:16 AM Victoria A wrote: [facility] that prompted transfer to Nurse Triage: Patient calling back to cancel NT appointmentThis encounter was created in error - please disregard.

## 2024-09-08 NOTE — Telephone Encounter (Signed)
  FYI Only or Action Required?: FYI only for provider: appointment scheduled on 11/19.  Patient was last seen in primary care on 07/15/2024 by Dorina Loving, PA-C.  Called Nurse Triage reporting Conjunctivitis.  Symptoms began x 2 days ago.  Interventions attempted: Prescription medications: VIGAMOX .  Symptoms are: unchanged.  Triage Disposition: See HCP Within 4 Hours (Or PCP Triage)  Patient/caregiver understands and will follow disposition?: YesCopied from CRM #[id number]. Topic: Clinical - Red Word Triage >> Sep 08, 2024  8:49 AM Mesmerise C wrote: [facility] that prompted transfer to Nurse Triage: Patient believes she has pink eye, started  moxifloxacin  (VIGAMOX ) 0.5 % ophthalmic solution but still discharging and dripping, itchiness and irritated with redness Reason for Disposition  MODERATE eye pain or discomfort (e.g., interferes with normal activities or awakens from sleep; more than mild)  Answer Assessment - Initial Assessment Questions 1. LOCATION: Where is the redness? (e.g., eyeball or outer eyelids) Note: When callers say the eye is red, they usually mean the sclera (white of the eye) is red.       Sclera is red   2. ONE OR BOTH EYES: Is the redness in one or both eyes?      Left eye   3. ONSET: When did the redness start? (e.g., hours, days)      X 2 days    4. EYELIDS: Are the eyelids red or swollen? If Yes, ask: How much?       No swelling noted    5. VISION: Do you have blurred vision?     No     6. ITCHING: Does it feel itchy? If so ask: How bad is it (Scale 1-10; or mild, moderate, severe)      Yes, mild-moderate    7. PAIN: Is it painful? If Yes, ask: How bad is the pain? (Scale 0-10; or none, mild, moderate, severe)     No pain    8. CONTACT LENS: Do you wear contacts?     No     9. CAUSE: What do you think is causing the redness?     Pink eye    10. OTHER SYMPTOMS: Do you have any other symptoms? (e.g.,  fever, runny nose, cough, vomiting)       Post nasal drip   Patient believes she has pink eye, started using  moxifloxacin  (VIGAMOX ) 0.5 % ophthalmic solution from previous prescription. And stated she is still experiencing discharge and drainage, crusting, itchiness and irritated with redness noted. Appointment scheduled for evaluation. Patient agrees with plan of care, and will call back if anything changes, or if symptoms worsen.  Protocols used: Eye - Redness-A-AH

## 2024-09-09 ENCOUNTER — Ambulatory Visit

## 2024-09-09 ENCOUNTER — Ambulatory Visit: Admitting: Family Medicine

## 2024-09-10 ENCOUNTER — Other Ambulatory Visit (HOSPITAL_COMMUNITY): Payer: Self-pay

## 2024-10-08 ENCOUNTER — Other Ambulatory Visit: Payer: Self-pay

## 2024-11-05 ENCOUNTER — Other Ambulatory Visit (HOSPITAL_BASED_OUTPATIENT_CLINIC_OR_DEPARTMENT_OTHER): Payer: Self-pay

## 2024-11-05 ENCOUNTER — Other Ambulatory Visit: Payer: Self-pay

## 2024-11-05 ENCOUNTER — Other Ambulatory Visit (HOSPITAL_COMMUNITY): Payer: Self-pay

## 2024-11-05 MED ORDER — ESTRADIOL 0.05 MG/24HR TD PTTW
MEDICATED_PATCH | TRANSDERMAL | 1 refills | Status: AC
  Filled 2024-11-05 (×2): qty 8, 28d supply, fill #0

## 2024-11-25 NOTE — Progress Notes (Unsigned)
 " [facility] [address] [place] [address] Phone: [phone] Subjective:    I'm seeing this patient by the request  of:  Catherine Fuller A, DO  CC:   YEP:Dlagzrupcz  11/03/2023 OMT and and seen for shoulder pain  Updated 11/29/2024 Michele [name] is a 59 y.o. 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   DDD (degenerative disc disease), cervical    Depression    Dorsalgia    Glaucoma    Hypercholesterolemia    Ileus (HCC)    Intestinal gas excretion    Memory loss 01/13/2020   Migraine 08/17/2020   Nonallopathic lesion of cervical region 01/23/2021   l areas are chronic   Patient tolerated the procedure well with improvement in symptoms  Patient given exercises, stretches and lifestyle modifications  See medications in patient instructions if given  Patient will follow up in 4-8 weeks   Peptic ulcer    Scapular dyskinesis 01/23/2021   Scarlet fever    Sequelae of other specified infectious and parasitic diseases 11/20/2021   Skin cancer    Tick bite 02/22/2018   UTI (urinary tract infection)    Yeast infection    Past Surgical History:  Procedure Laterality Date   BASAL CELL CARCINOMA EXCISION     BREAST BIOPSY Left 1992   Duct removal (No scar seen)    BREAST EXCISIONAL BIOPSY Left 1992   COLONOSCOPY  07/2021   DACRORHINOCYSTOTOMY  10/2020   LEFT HEART CATH AND CORONARY ANGIOGRAPHY N/A 01/16/2018   Procedure: LEFT HEART CATH AND CORONARY ANGIOGRAPHY;  Surgeon: Mady Bruckner, MD;  Location: [facility];  Service: Cardiovascular;  Laterality: N/A;   SHOULDER ARTHROSCOPY WITH ROTATOR CUFF REPAIR AND SUBACROMIAL DECOMPRESSION Left 11/12/2022    Procedure: SHOULDER ARTHROSCOPY WITH ROTATOR CUFF DEBBRIDEMENT AND SUBACROMIAL DECOMPRESSION;  Surgeon: Dozier Soulier, MD;  Location: [facility];  Service: Orthopedics;  Laterality: Left;   UPPER GASTROINTESTINAL ENDOSCOPY  07/2021   Social History   Socioeconomic History   Marital status: Married    Spouse name: Not on file   Number of children: 2   Years of education: Not on file   Highest education level: Bachelor's degree (e.g., BA, AB, BS)  Occupational History   Occupation: Teacher, 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    Education/employment: [employer]      -Wears a bicycle helmet riding a bike: Yes     -smoke alarm in the home:Yes     - wears seatbelt: Yes     -  Feels safe in their relationships: Yes      Social Drivers of Health   Tobacco Use: Low Risk (07/12/2024)   Patient History    Smoking Tobacco Use: Never    Smokeless Tobacco Use: Never    Passive Exposure: Never  Financial Resource Strain: Low Risk (01/27/2023)   Overall Financial Resource Strain (CARDIA)    Difficulty of Paying Living Expenses: Not hard at all  Food Insecurity: No Food Insecurity (01/27/2023)   Hunger Vital Sign    Worried About Running Out of Food in the Last Year: Never true    Ran Out of Food in the Last Year: Never true  Transportation Needs: No Transportation Needs (01/27/2023)   PRAPARE - Administrator, Civil Service (Medical): No    Lack of Transportation (Non-Medical): No  Physical Activity:  Insufficiently Active (01/27/2023)   Exercise Vital Sign    Days of Exercise per Week: 2 days    Minutes of Exercise per Session: 60 min  Stress: Stress Concern Present (01/27/2023)   [employer] of Occupational Health - Occupational Stress Questionnaire    Feeling of Stress : To some extent  Social Connections: Socially Integrated (01/27/2023)   Social Connection and Isolation Panel    Frequency of Communication with Friends and Family: Three times a week    Frequency of Social Gatherings with Friends and Family: Once a week    Attends Religious Services: More than 4 times per year    Active Member of Clubs or Organizations: Yes    Attends Banker Meetings: More than 4 times per year    Marital Status: Married  Depression (PHQ2-9): Medium Risk (07/15/2024)   Depression (PHQ2-9)    PHQ-2 Score: 5  Alcohol Screen: Low Risk (01/27/2023)   Alcohol Screen    Last Alcohol Screening Score (AUDIT): 4  Housing: Low Risk (01/27/2023)   Housing    Last Housing Risk Score: 0  Utilities: Not on file  Health Literacy: Not on file   Allergies[1] Family History  Problem Relation Age of Onset   Hypertension Mother    Diabetes Mother    Diverticulitis Mother    Heart failure Mother    Hyperlipidemia Father    Diabetes Father    Heart attack Father    Arrhythmia Father    Sleep apnea Father    Heart attack Sister    Obesity Sister    Arthritis Sister    Asthma Sister    Alcohol abuse Brother    Stroke Maternal Uncle    Pancreatic cancer Maternal Uncle    Leukemia Maternal Grandmother    Diabetes Maternal Grandfather    Diabetes Paternal Grandmother    Heart attack Paternal Grandfather    Heart disease Paternal Grandfather    Migraines Daughter    Liver disease Other        Paternal great aunt   Breast cancer Neg Hx    Colon cancer Neg Hx    Esophageal cancer Neg Hx    Inflammatory bowel disease Neg Hx    Rectal cancer Neg Hx    Stomach cancer Neg Hx     Current  Outpatient Medications (Endocrine & Metabolic):    estradiol  (VIVELLE -DOT) 0.05 MG/24HR patch, Place 1 patch (0.05 mg total) onto the skin 2 (two) times a week.   progesterone  (PROMETRIUM ) 100 MG capsule, Take 1 capsule (100 mg total) by mouth at bedtime. AND increase to 2 capsules (200 mg total) at bedtime prior to cycle.  Current Outpatient Medications (Analgesics):    aspirin  EC 81 MG tablet, Take 1 tablet by mouth every other day.  Current Outpatient Medications (Other):    Cholecalciferol (VITAMIN D3) 25 MCG/SPRAY LIQD, Take 250 mcg by mouth daily.   FLUoxetine  (PROZAC ) 10 MG capsule, Take 1 capsule by mouth every other day alternating with 2 capsules (20mg  total) by mouth every other day.   gabapentin  (NEURONTIN ) 100 MG capsule, Take 2 capsules (200 mg total) by mouth 2 (two) times daily.   moxifloxacin  (VIGAMOX ) 0.5 % ophthalmic solution, Place 1 drop into the left eye 3 (three) times daily.   Reviewed prior external information including notes and imaging from  primary care provider As well as notes that were available from care everywhere and other healthcare systems.  Past medical history, social, surgical and family history all reviewed in electronic medical record.  No pertanent information unless stated regarding to the chief complaint.   Review of Systems:  No headache, visual changes, nausea, vomiting, diarrhea, constipation, dizziness, abdominal pain, skin rash, fevers, chills, night sweats, weight loss, swollen lymph nodes, body aches, joint swelling, chest pain, shortness of breath, mood changes. POSITIVE muscle aches  Objective  There were no vitals taken for this visit.   General: No apparent distress alert and oriented x3 mood and affect normal, dressed appropriately.  HEENT: Pupils equal, extraocular movements intact  Respiratory: Patient's speak in full sentences and does not appear short of breath  Cardiovascular: No lower extremity edema, non tender, no erythema       Impression and Recommendations:           [1]  Allergies Allergen Reactions   Lipitor [Atorvastatin Calcium] Other (See Comments)    Memory issue; myalgia   Celebrex [Celecoxib] Rash   "

## 2024-11-26 ENCOUNTER — Other Ambulatory Visit (HOSPITAL_BASED_OUTPATIENT_CLINIC_OR_DEPARTMENT_OTHER): Payer: Self-pay

## 2024-11-29 ENCOUNTER — Ambulatory Visit: Admitting: Family Medicine
